# Patient Record
Sex: Female | Born: 1985
Health system: Southern US, Community
[De-identification: ages and names within clinical notes are randomized; demographics above are authoritative.]

## PROBLEM LIST (undated history)

## (undated) DIAGNOSIS — D269 Other benign neoplasm of uterus, unspecified: Secondary | ICD-10-CM

## (undated) DIAGNOSIS — N809 Endometriosis, unspecified: Secondary | ICD-10-CM

## (undated) DIAGNOSIS — Z9882 Breast implant status: Secondary | ICD-10-CM

## (undated) DIAGNOSIS — J36 Peritonsillar abscess: Secondary | ICD-10-CM

## (undated) DIAGNOSIS — R87619 Unspecified abnormal cytological findings in specimens from cervix uteri: Secondary | ICD-10-CM

## (undated) DIAGNOSIS — N946 Dysmenorrhea, unspecified: Secondary | ICD-10-CM

## (undated) DIAGNOSIS — D649 Anemia, unspecified: Secondary | ICD-10-CM

## (undated) DIAGNOSIS — D259 Leiomyoma of uterus, unspecified: Secondary | ICD-10-CM

## (undated) DIAGNOSIS — N921 Excessive and frequent menstruation with irregular cycle: Secondary | ICD-10-CM

## (undated) HISTORY — PX: LASIK: SHX215

## (undated) HISTORY — DX: Breast implant status: Z98.82

## (undated) HISTORY — DX: Unspecified abnormal cytological findings in specimens from cervix uteri: R87.619

## (undated) HISTORY — DX: Endometriosis, unspecified: N80.9

---
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
## 2009-10-19 ENCOUNTER — Inpatient Hospital Stay (HOSPITAL_COMMUNITY): Admission: AD | Admit: 2009-10-19 | Discharge: 2009-10-22 | Payer: Self-pay | Admitting: Obstetrics and Gynecology

## 2010-08-10 LAB — CBC
HCT: 31.6 % — ABNORMAL LOW (ref 36.0–46.0)
HCT: 35.6 % — ABNORMAL LOW (ref 36.0–46.0)
Hemoglobin: 12.1 g/dL (ref 12.0–15.0)
MCHC: 34 g/dL (ref 30.0–36.0)
MCHC: 34.1 g/dL (ref 30.0–36.0)
MCV: 90.1 fL (ref 78.0–100.0)
MCV: 90.2 fL (ref 78.0–100.0)
Platelets: 185 10*3/uL (ref 150–400)
RBC: 3.96 MIL/uL (ref 3.87–5.11)
RDW: 12.9 % (ref 11.5–15.5)
RDW: 13.3 % (ref 11.5–15.5)

## 2012-07-01 LAB — OB RESULTS CONSOLE ABO/RH: RH Type: POSITIVE

## 2012-07-01 LAB — OB RESULTS CONSOLE ANTIBODY SCREEN: Antibody Screen: NEGATIVE

## 2012-07-01 LAB — OB RESULTS CONSOLE HIV ANTIBODY (ROUTINE TESTING): HIV: NONREACTIVE

## 2012-07-01 LAB — OB RESULTS CONSOLE HEPATITIS B SURFACE ANTIGEN: Hepatitis B Surface Ag: NEGATIVE

## 2012-07-01 LAB — OB RESULTS CONSOLE RUBELLA ANTIBODY, IGM: RUBELLA: IMMUNE

## 2012-09-12 ENCOUNTER — Emergency Department (HOSPITAL_COMMUNITY): Payer: 59

## 2012-09-12 ENCOUNTER — Emergency Department (HOSPITAL_COMMUNITY)
Admission: EM | Admit: 2012-09-12 | Discharge: 2012-09-13 | Disposition: A | Discharge status: Home / Self Care | Payer: 59 | Attending: Emergency Medicine | Admitting: Emergency Medicine

## 2012-09-12 ENCOUNTER — Encounter (HOSPITAL_COMMUNITY): Payer: Self-pay

## 2012-09-12 DIAGNOSIS — E669 Obesity, unspecified: Secondary | ICD-10-CM | POA: Insufficient documentation

## 2012-09-12 DIAGNOSIS — K838 Other specified diseases of biliary tract: Secondary | ICD-10-CM

## 2012-09-12 DIAGNOSIS — Z3202 Encounter for pregnancy test, result negative: Secondary | ICD-10-CM | POA: Insufficient documentation

## 2012-09-12 DIAGNOSIS — Z880 Allergy status to penicillin: Secondary | ICD-10-CM | POA: Insufficient documentation

## 2012-09-12 DIAGNOSIS — Z79899 Other long term (current) drug therapy: Secondary | ICD-10-CM | POA: Insufficient documentation

## 2012-09-12 DIAGNOSIS — F17200 Nicotine dependence, unspecified, uncomplicated: Secondary | ICD-10-CM | POA: Insufficient documentation

## 2012-09-12 DIAGNOSIS — K828 Other specified diseases of gallbladder: Secondary | ICD-10-CM | POA: Insufficient documentation

## 2012-09-12 LAB — COMPREHENSIVE METABOLIC PANEL
ALT: 15 U/L (ref 0–35)
AST: 21 U/L (ref 0–37)
Albumin: 3.7 g/dL (ref 3.5–5.2)
CO2: 26 mEq/L (ref 19–32)
Calcium: 9.1 mg/dL (ref 8.4–10.5)
Chloride: 105 mEq/L (ref 96–112)
Creatinine, Ser: 0.85 mg/dL (ref 0.50–1.10)
GFR calc non Af Amer: >90 mL/min (ref >90)
Sodium: 139 mEq/L (ref 135–145)
Total Bilirubin: 0.2 mg/dL — ABNORMAL LOW (ref 0.3–1.2)

## 2012-09-12 LAB — URINALYSIS, MICROSCOPIC ONLY
Glucose, UA: NEGATIVE mg/dL
Hgb urine dipstick: NEGATIVE
Leukocytes, UA: NEGATIVE
pH: 5.5 (ref 5.0–8.0)

## 2012-09-12 LAB — CBC WITH DIFFERENTIAL/PLATELET
Basophils Absolute: 0.1 10*3/uL (ref 0.0–0.1)
Basophils Relative: 1 % (ref 0–1)
Lymphocytes Relative: 33 % (ref 12–46)
MCHC: 34.8 g/dL (ref 30.0–36.0)
Monocytes Absolute: 0.8 10*3/uL (ref 0.1–1.0)
Neutro Abs: 6.3 10*3/uL (ref 1.7–7.7)
Platelets: 302 10*3/uL (ref 150–400)
RDW: 12.5 % (ref 11.5–15.5)
WBC: 11.2 10*3/uL — ABNORMAL HIGH (ref 4.0–10.5)

## 2012-09-12 LAB — POCT PREGNANCY, URINE: Preg Test, Ur: NEGATIVE

## 2012-09-12 NOTE — ED Notes (Signed)
Received report from Kelly RN

## 2012-09-12 NOTE — ED Notes (Signed)
Pt reports RUQ pain radiating into her back x3 weeks, pt reports the pain starts around 1900 every night at dinner or just after finishing her dinner

## 2012-09-12 NOTE — ED Provider Notes (Signed)
History     CSN: [id number]  Arrival date & time 09/12/12  1926   None     Chief Complaint  Patient presents with  . Abdominal Pain    (Consider location/radiation/quality/duration/timing/severity/associated sxs/prior treatment) HPI History provided by pt.   Pt has had severe RUQ pain qhs x 3 weeks.  Usually starts around 7pm, limits her ability to sleep, but resolves by morning.  Radiates through to her mid-back.  No aggravating/alleviating factors and does not seem to be related to po intake.  No associated fever, cough, SOB, N/V/D, hematochezia/melena, urinary or vaginal sx.  No h/o abdominal surgeries.  Pt concerned she may have cholelithiasis.  History reviewed. No pertinent past medical history.  Past Surgical History  Procedure Laterality Date  . Cesarean section      History reviewed. No pertinent family history.  History  Substance Use Topics  . Smoking status: Current Every Day Smoker  . Smokeless tobacco: Not on file  . Alcohol Use: No    OB History   Grav Para Term Preterm Abortions TAB SAB Ect Mult Living                  Review of Systems  All other systems reviewed and are negative.    Allergies  Bactrim; Penicillins; and Sulfa antibiotics  Home Medications   Current Outpatient Rx  Name  Route  Sig  Dispense  Refill  . phentermine 37.5 MG capsule   Oral   Take 37.5 mg by mouth daily.         . ondansetron (ZOFRAN) 8 MG tablet   Oral   Take 1 tablet (8 mg total) by mouth every 8 (eight) hours as needed for nausea.   20 tablet   0   . oxyCODONE-acetaminophen (PERCOCET/ROXICET) 5-325 MG per tablet   Oral   Take 1 tablet by mouth every 4 (four) hours as needed for pain.   20 tablet   0     BP 126/80  Pulse 90  Temp(Src) 98 F (36.7 C) (Oral)  Resp 18  Ht 5\' 6"  (1.676 m)  Wt 170 lb (77.111 kg)  BMI 27.45 kg/m2  SpO2 100%  LMP 07/23/2012  Physical Exam  Nursing note and vitals reviewed. Constitutional: She is oriented to  person, place, and time. She appears well-developed and well-nourished. No distress.  HENT:  Head: Normocephalic and atraumatic.  Eyes:  Normal appearance  Neck: Normal range of motion.  Cardiovascular: Normal rate and regular rhythm.   Pulmonary/Chest: Effort normal and breath sounds normal. No respiratory distress.  Abdominal: Soft. Bowel sounds are normal. She exhibits no distension and no mass. There is no rebound and no guarding.  Obese.  Mild epigastric and RUQ ttp. Negative murphy's.   Genitourinary:  No CVA tenderness  Musculoskeletal: Normal range of motion.  Neurological: She is alert and oriented to person, place, and time.  Skin: Skin is warm and dry. No rash noted.  Psychiatric: She has a normal mood and affect. Her behavior is normal.    ED Course  Procedures (including critical care time)  Labs Reviewed  CBC WITH DIFFERENTIAL - Abnormal; Notable for the following:    WBC 11.2 (*)    All other components within normal limits  COMPREHENSIVE METABOLIC PANEL - Abnormal; Notable for the following:    Glucose, Bld 112 (*)    Total Bilirubin 0.2 (*)    All other components within normal limits  URINALYSIS, MICROSCOPIC ONLY - Abnormal; Notable for  the following:    Squamous Epithelial / LPF FEW (*)    All other components within normal limits  LIPASE, BLOOD  POCT PREGNANCY, URINE  POCT I-STAT TROPONIN I   US Abdomen Complete  09/13/2012  *RADIOLOGY REPORT*  Clinical Data:  Right upper quadrant pain.  Concern for cholelithiasis.  COMPLETE ABDOMINAL ULTRASOUND  Comparison:  None.  Findings:  Gallbladder:  Gallbladder sludge.  Suspect concurrent small stones. Some stones are mobile within the fundus.  No wall thickening or pericholecystic fluid. Sonographic Murphy's sign was not elicited.  Common bile duct: Normal, 3 mm.  Liver: Normal in echogenicity, without focal lesion.  IVC: Negative  Pancreas:  Negative  Spleen:  Normal in size and echogenicity.  Right Kidney:  11.8  cm. No hydronephrosis.  Left Kidney:  10.6 cm. No hydronephrosis.  Abdominal aorta:  Nonaneurysmal without ascites.  IMPRESSION: Gallbladder sludge and small stones.  No evidence of acute cholecystitis or biliary ductal dilatation.   Original Report Authenticated By: Jeronimo Greaves, M.D.      1. Biliary sludge       MDM  26yo healthy F presents w/ intermittent RUQ pain x 3 weeks.  On exam, afebrile, NAD, abd obese, soft/non-distended, mild epigastric and RUQ ttp w/ neg murphy's sign.  Labs unremarkable.  [place] abd ordered to r/o cholelithiasis and is pending.  Pt declines pain medication at this time.    US shows biliary sludge and small stones w/out cholecystitis.  Results discussed w/ pt.  No pain currently and tolerating pos.  Prescribed percocet and zofran and referred to general surgery. Return precautions discussed. 1:09 AM        Otilio Miu, PA-C 09/13/12 0109

## 2012-09-13 MED ORDER — ONDANSETRON HCL 8 MG PO TABS: 8.0000 mg | ORAL_TABLET | Freq: Three times a day (TID) | ORAL | Status: DC | PRN

## 2012-09-13 MED ORDER — OXYCODONE-ACETAMINOPHEN 5-325 MG PO TABS: 1.0000 | ORAL_TABLET | ORAL | Status: DC | PRN

## 2012-09-13 NOTE — ED Notes (Signed)
Pt comfortable with discharge instructions, prescriptions x2

## 2012-09-13 NOTE — ED Provider Notes (Signed)
  Medical screening examination/treatment/procedure(s) were performed by non-physician practitioner and as supervising physician I was immediately available for consultation/collaboration.   Gerhard Munch, MD 09/13/12 [phone]

## 2012-09-14 ENCOUNTER — Encounter (HOSPITAL_COMMUNITY): Payer: Self-pay | Admitting: Pharmacy Technician

## 2012-09-15 NOTE — Patient Instructions (Addendum)
DIOR DOMINIK  09/15/2012   Your procedure is scheduled on:   09/22/2012   Report to [facility] at  615  AM.  Call this number if you have problems the morning of surgery: [phone]   Remember:   Do not eat food or drink liquids after midnight.   Take these medicines the morning of surgery with A SIP OF WATER: zofran,percocet   Do not wear jewelry, make-up or nail polish.  Do not wear lotions, powders, or perfumes.   Do not shave 48 hours prior to surgery. Men may shave face and neck.  Do not bring valuables to the hospital.  Contacts, dentures or bridgework may not be worn into surgery.  Leave suitcase in the car. After surgery it may be brought to your room.  For patients admitted to the hospital, checkout time is 11:00 AM the day of discharge.   Patients discharged the day of surgery will not be allowed to drive  home.  Name and phone number of your driver: family  Special Instructions: Shower using CHG 2 nights before surgery and the night before surgery.  If you shower the day of surgery use CHG.  Use special wash - you have one bottle of CHG for all showers.  You should use approximately 1/3 of the bottle for each shower.   Please read over the following fact sheets that you were given: Pain Booklet, Coughing and Deep Breathing, MRSA Information, Surgical Site Infection Prevention, Anesthesia Post-op Instructions and Care and Recovery After Surgery Laparoscopic Cholecystectomy Laparoscopic cholecystectomy is surgery to remove the gallbladder. The gallbladder is located slightly to the right of center in the abdomen, behind the liver. It is a concentrating and storage sac for the bile produced in the liver. Bile aids in the digestion and absorption of fats. Gallbladder disease (cholecystitis) is an inflammation of your gallbladder. This condition is usually caused by a buildup of gallstones (cholelithiasis) in your gallbladder. Gallstones can block the flow of bile, resulting  in inflammation and pain. In severe cases, emergency surgery may be required. When emergency surgery is not required, you will have time to prepare for the procedure. Laparoscopic surgery is an alternative to open surgery. Laparoscopic surgery usually has a shorter recovery time. Your common bile duct may also need to be examined and explored. Your caregiver will discuss this with you if he or she feels this should be done. If stones are found in the common bile duct, they may be removed. LET YOUR CAREGIVER KNOW ABOUT:  Allergies to food or medicine.  Medicines taken, including vitamins, herbs, eyedrops, over-the-counter medicines, and creams.  Use of steroids (by mouth or creams).  Previous problems with anesthetics or numbing medicines.  History of bleeding problems or blood clots.  Previous surgery.  Other health problems, including diabetes and kidney problems.  Possibility of pregnancy, if this applies. RISKS AND COMPLICATIONS All surgery is associated with risks. Some problems that may occur following this procedure include:  Infection.  Damage to the common bile duct, nerves, arteries, veins, or other internal organs such as the stomach or intestines.  Bleeding.  A stone may remain in the common bile duct. BEFORE THE PROCEDURE  Do not take aspirin for 3 days prior to surgery or blood thinners for 1 week prior to surgery.  Do not eat or drink anything after midnight the night before surgery.  Let your caregiver know if you develop a cold or other infectious problem prior to  surgery.  You should be present 60 minutes before the procedure or as directed. PROCEDURE  You will be given medicine that makes you sleep (general anesthetic). When you are asleep, your surgeon will make several small cuts (incisions) in your abdomen. One of these incisions is used to insert a small, lighted scope (laparoscope) into the abdomen. The laparoscope helps the surgeon see into your abdomen.  Carbon dioxide gas will be pumped into your abdomen. The gas allows more room for the surgeon to perform your surgery. Other operating instruments are inserted through the other incisions. Laparoscopic procedures may not be appropriate when:  There is major scarring from previous surgery.  The gallbladder is extremely inflamed.  There are bleeding disorders or unexpected cirrhosis of the liver.  A pregnancy is near term.  Other conditions make the laparoscopic procedure impossible. If your surgeon feels it is not safe to continue with a laparoscopic procedure, he or she will perform an open abdominal procedure. In this case, the surgeon will make an incision to open the abdomen. This gives the surgeon a larger view and field to work within. This may allow the surgeon to perform procedures that sometimes cannot be performed with a laparoscope alone. Open surgery has a longer recovery time. AFTER THE PROCEDURE  You will be taken to the recovery area where a nurse will watch and check your progress.  You may be allowed to go home the same day.  Do not resume physical activities until directed by your caregiver.  You may resume a normal diet and activities as directed. Document Released: 05/10/2005 Document Revised: 08/02/2011 Document Reviewed: 10/23/2010 [facility] Patient Information 2013 [place], [place]. PATIENT INSTRUCTIONS POST-ANESTHESIA  IMMEDIATELY FOLLOWING SURGERY:  Do not drive or operate machinery for the first twenty four hours after surgery.  Do not make any important decisions for twenty four hours after surgery or while taking narcotic pain medications or sedatives.  If you develop intractable nausea and vomiting or a severe headache please notify your doctor immediately.  FOLLOW-UP:  Please make an appointment with your surgeon as instructed. You do not need to follow up with anesthesia unless specifically instructed to do so.  WOUND CARE INSTRUCTIONS (if applicable):  Keep  a dry clean dressing on the anesthesia/puncture wound site if there is drainage.  Once the wound has quit draining you may leave it open to air.  Generally you should leave the bandage intact for twenty four hours unless there is drainage.  If the epidural site drains for more than 36-48 hours please call the anesthesia department.  QUESTIONS?:  Please feel free to call your physician or the hospital operator if you have any questions, and they will be happy to assist you.

## 2012-09-18 ENCOUNTER — Encounter (HOSPITAL_COMMUNITY)
Admission: RE | Admit: 2012-09-18 | Discharge: 2012-09-18 | Disposition: A | Discharge status: Home / Self Care | Payer: 59 | Source: Ambulatory Visit | Attending: Orthopedic Surgery | Admitting: Orthopedic Surgery

## 2012-09-18 ENCOUNTER — Encounter (HOSPITAL_COMMUNITY): Payer: Self-pay

## 2012-09-18 NOTE — Consult Note (Signed)
**Note Tanya Sanders** Tanya Sanders, SOULE           ACCOUNT NO.:  [id number]  MEDICAL RECORD NO.:  [id number]  LOCATION:                                 FACILITY:  PHYSICIAN:  Barbaraann Barthel, M.D. 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  In essence, she has had about a 3-week history of recurrent right upper quadrant pain with nausea.  No vomiting.  She was seen in the emergency room, where she was noted to have multiple small stones within the gallbladder and no obvious pericystic fluid or thickening.  She referred herself to me, and I saw her in my office on September 13, 2012, at which time I performed a history and physical in preparation for cholecystectomy.  She is in no acute distress.  She has very minimal right upper quadrant discomfort at present.  She has no masses, no hernias, and the rectal examination was deferred as the patient is presently having her menses.  PAST SURGICAL PROCEDURES:  She has had a C-section in 2011.  ALLERGIES:  She is allergic to PENICILLIN, SULFA, and BACTRIM for her regular medications consult medicine list.  SOCIAL HISTORY:  She smokes about a half pack of cigarettes per day and she is a non drinker.  PHYSICAL EXAMINATION:  VITAL SIGNS:  As stated, she is in no acute distress.  She is 5 feet 6 inches, weighs 170 pounds.  Temperature is 98.0, pulse rate is 100, respirations 14, blood pressure 90/64. HEENT:  Head is normocephalic.  Eyes, extraocular movements are intact. Pupils were round and react to light and accommodation.  The sclerae are anicteric.  She has no adenopathy and no thyromegaly.  No bruits are appreciated. CHEST:  Clear both to anterior and posterior auscultation. HEART:  Regular rhythm. BREASTS AND AXILLA:  Without  masses. ABDOMEN:  Softly tender in the right upper quadrant.  No hernias are appreciated. RECTAL AND PELVIC:  Deferred due to the patient's having her menses. EXTREMITIES:  Within normal limits without clubbing, varicosities, or cyanosis.  REVIEW OF SYSTEMS:  NEURO SYSTEM:  No history of migraines or seizures or history of any lateralizing symptoms noted. ENDOCRINE SYSTEM:  No history of diabetes or thyroid disease. CARDIOPULMONARY SYSTEM:  She is a smoker.  MUSCULOSKELETAL SYSTEM: Grossly within normal limits.  OB/GYN HISTORY:  She is a gravida 1, para 0, cesarean 1, abortus 0 female who is currently menstruating.  GI SYSTEM:  No history of hepatitis, constipation, diarrhea, bright red rectal bleeding, or history of inflammatory bowel disease or irritable bowel syndrome.  No history of unexplained weight loss.  She has never had a colonoscopy.  GU SYSTEM:  No history of frequency, dysuria, or kidney stones,  LABORATORY DATA:  Pending.  Sonogram results as mentioned above.  In essence, Tanya Sanders is a 27 year old white female who has symptomatic gall stones.  We will plan to take care of this as soon as she is able to arrange this around her work schedule.  In  the interim, she has been placed on a restrictive diet and she is told to come to me or go to the emergency room should there be any acute flare up.  We discussed the need for surgery, discussing complications of laparoscopic surgery, not limited to, but including bleeding, infection, damage to bile ducts, perforation of organs, transitory diarrhea, and the possibility of open surgery might be required.  Informed consent was obtained.     Barbaraann Barthel, M.D.     WB/MEDQ  D:  09/15/2012  T:  09/16/2012  Job:  [id number]

## 2012-09-22 ENCOUNTER — Ambulatory Visit (HOSPITAL_COMMUNITY): Payer: 59 | Admitting: Anesthesiology

## 2012-09-22 ENCOUNTER — Encounter (HOSPITAL_COMMUNITY): Payer: Self-pay | Admitting: Anesthesiology

## 2012-09-22 ENCOUNTER — Encounter (HOSPITAL_COMMUNITY): Payer: Self-pay | Admitting: *Deleted

## 2012-09-22 ENCOUNTER — Encounter (HOSPITAL_COMMUNITY)
Admission: RE | Disposition: A | Discharge status: Home / Self Care | Payer: Self-pay | Source: Ambulatory Visit | Attending: General Surgery

## 2012-09-22 ENCOUNTER — Observation Stay (HOSPITAL_COMMUNITY)
Admission: RE | Admit: 2012-09-22 | Discharge: 2012-09-23 | Disposition: A | Discharge status: Home / Self Care | Payer: 59 | Source: Ambulatory Visit | Attending: General Surgery | Admitting: General Surgery

## 2012-09-22 DIAGNOSIS — K8010 Calculus of gallbladder with chronic cholecystitis without obstruction: Principal | ICD-10-CM | POA: Insufficient documentation

## 2012-09-22 DIAGNOSIS — F17200 Nicotine dependence, unspecified, uncomplicated: Secondary | ICD-10-CM | POA: Insufficient documentation

## 2012-09-22 DIAGNOSIS — Z01812 Encounter for preprocedural laboratory examination: Secondary | ICD-10-CM | POA: Insufficient documentation

## 2012-09-22 HISTORY — PX: CHOLECYSTECTOMY: SHX55

## 2012-09-22 SURGERY — LAPAROSCOPIC CHOLECYSTECTOMY
Anesthesia: General | Site: Abdomen | Wound class: Clean Contaminated

## 2012-09-22 MED ORDER — ROCURONIUM BROMIDE 100 MG/10ML IV SOLN
INTRAVENOUS | Status: DC | PRN
  Administered 2012-09-22 (×2): 5 mg via INTRAVENOUS
  Administered 2012-09-22 (×2): 10 mg via INTRAVENOUS
  Administered 2012-09-22: 40 mg via INTRAVENOUS

## 2012-09-22 MED ORDER — 0.9 % SODIUM CHLORIDE (POUR BTL) OPTIME
TOPICAL | Status: DC | PRN
  Administered 2012-09-22: 1000 mL

## 2012-09-22 MED ORDER — MIDAZOLAM HCL 2 MG/2ML IJ SOLN
INTRAMUSCULAR | Status: AC
  Filled 2012-09-22: qty 2

## 2012-09-22 MED ORDER — ONDANSETRON HCL 4 MG PO TABS: 4.0000 mg | ORAL_TABLET | Freq: Four times a day (QID) | ORAL | Status: DC | PRN

## 2012-09-22 MED ORDER — LORAZEPAM 0.5 MG PO TABS
0.5000 mg | ORAL_TABLET | Freq: Every day | ORAL | Status: DC
  Administered 2012-09-22: 0.5 mg via ORAL
  Filled 2012-09-22: qty 1

## 2012-09-22 MED ORDER — LIDOCAINE HCL (PF) 1 % IJ SOLN
INTRAMUSCULAR | Status: AC
  Filled 2012-09-22: qty 5

## 2012-09-22 MED ORDER — FENTANYL CITRATE 0.05 MG/ML IJ SOLN
INTRAMUSCULAR | Status: DC | PRN
  Administered 2012-09-22 (×5): 50 ug via INTRAVENOUS
  Administered 2012-09-22 (×2): 25 ug via INTRAVENOUS
  Administered 2012-09-22: 50 ug via INTRAVENOUS
  Administered 2012-09-22: 100 ug via INTRAVENOUS

## 2012-09-22 MED ORDER — ONDANSETRON HCL 4 MG/2ML IJ SOLN
INTRAMUSCULAR | Status: AC
  Filled 2012-09-22: qty 2

## 2012-09-22 MED ORDER — DEXAMETHASONE SODIUM PHOSPHATE 4 MG/ML IJ SOLN
INTRAMUSCULAR | Status: AC
  Filled 2012-09-22: qty 1

## 2012-09-22 MED ORDER — FENTANYL CITRATE 0.05 MG/ML IJ SOLN
25.0000 ug | INTRAMUSCULAR | Status: DC | PRN
  Administered 2012-09-22 (×2): 50 ug via INTRAVENOUS
  Administered 2012-09-22: 25 ug via INTRAVENOUS
  Administered 2012-09-22: 50 ug via INTRAVENOUS

## 2012-09-22 MED ORDER — CIPROFLOXACIN IN D5W 400 MG/200ML IV SOLN
400.0000 mg | INTRAVENOUS | Status: AC
  Administered 2012-09-22: 400 mg via INTRAVENOUS

## 2012-09-22 MED ORDER — FENTANYL CITRATE 0.05 MG/ML IJ SOLN
INTRAMUSCULAR | Status: AC
  Filled 2012-09-22: qty 5

## 2012-09-22 MED ORDER — POTASSIUM CHLORIDE IN NACL 20-0.9 MEQ/L-% IV SOLN
INTRAVENOUS | Status: DC
  Administered 2012-09-22 – 2012-09-23 (×2): via INTRAVENOUS

## 2012-09-22 MED ORDER — FENTANYL CITRATE 0.05 MG/ML IJ SOLN
INTRAMUSCULAR | Status: AC
  Filled 2012-09-22: qty 2

## 2012-09-22 MED ORDER — ONDANSETRON HCL 4 MG/2ML IJ SOLN: 4.0000 mg | Freq: Once | INTRAMUSCULAR | Status: DC | PRN

## 2012-09-22 MED ORDER — PROPOFOL 10 MG/ML IV EMUL
INTRAVENOUS | Status: AC
  Filled 2012-09-22: qty 20

## 2012-09-22 MED ORDER — BUPIVACAINE HCL (PF) 0.5 % IJ SOLN
INTRAMUSCULAR | Status: DC | PRN
  Administered 2012-09-22: 10 mL

## 2012-09-22 MED ORDER — MORPHINE SULFATE 2 MG/ML IJ SOLN
1.0000 mg | INTRAMUSCULAR | Status: DC | PRN
  Administered 2012-09-22 – 2012-09-23 (×8): 1 mg via INTRAVENOUS
  Filled 2012-09-22 (×8): qty 1

## 2012-09-22 MED ORDER — PROPOFOL 10 MG/ML IV BOLUS
INTRAVENOUS | Status: DC | PRN
  Administered 2012-09-22: 150 mg via INTRAVENOUS
  Administered 2012-09-22: 20 mg via INTRAVENOUS
  Administered 2012-09-22: 30 mg via INTRAVENOUS

## 2012-09-22 MED ORDER — SODIUM CHLORIDE 0.9 % IR SOLN
Status: DC | PRN
  Administered 2012-09-22: 3000 mL

## 2012-09-22 MED ORDER — WATER FOR IRRIGATION, STERILE IR SOLN
Status: DC | PRN
  Administered 2012-09-22: 2000 mL

## 2012-09-22 MED ORDER — ROCURONIUM BROMIDE 50 MG/5ML IV SOLN
INTRAVENOUS | Status: AC
  Filled 2012-09-22: qty 1

## 2012-09-22 MED ORDER — GLYCOPYRROLATE 0.2 MG/ML IJ SOLN
INTRAMUSCULAR | Status: AC
  Filled 2012-09-22: qty 1

## 2012-09-22 MED ORDER — LIDOCAINE HCL (PF) 1 % IJ SOLN
INTRAMUSCULAR | Status: AC
  Filled 2012-09-22: qty 2

## 2012-09-22 MED ORDER — NEOSTIGMINE METHYLSULFATE 1 MG/ML IJ SOLN
INTRAMUSCULAR | Status: DC | PRN
  Administered 2012-09-22: 2 mg via INTRAVENOUS

## 2012-09-22 MED ORDER — HEMOSTATIC AGENTS (NO CHARGE) OPTIME
TOPICAL | Status: DC | PRN
  Administered 2012-09-22: 1 via TOPICAL

## 2012-09-22 MED ORDER — NEOSTIGMINE METHYLSULFATE 1 MG/ML IJ SOLN
INTRAMUSCULAR | Status: AC
  Filled 2012-09-22: qty 1

## 2012-09-22 MED ORDER — MIDAZOLAM HCL 2 MG/2ML IJ SOLN
1.0000 mg | INTRAMUSCULAR | Status: DC | PRN
  Administered 2012-09-22: 2 mg via INTRAVENOUS

## 2012-09-22 MED ORDER — ONDANSETRON HCL 4 MG/2ML IJ SOLN
4.0000 mg | Freq: Once | INTRAMUSCULAR | Status: AC
  Administered 2012-09-22: 4 mg via INTRAVENOUS

## 2012-09-22 MED ORDER — LACTATED RINGERS IV SOLN
INTRAVENOUS | Status: DC
  Administered 2012-09-22 (×2): via INTRAVENOUS

## 2012-09-22 MED ORDER — ONDANSETRON HCL 4 MG/2ML IJ SOLN
4.0000 mg | Freq: Four times a day (QID) | INTRAMUSCULAR | Status: DC | PRN
  Administered 2012-09-22 – 2012-09-23 (×2): 4 mg via INTRAVENOUS
  Filled 2012-09-22 (×2): qty 2

## 2012-09-22 MED ORDER — GLYCOPYRROLATE 0.2 MG/ML IJ SOLN
INTRAMUSCULAR | Status: DC | PRN
  Administered 2012-09-22: 0.4 mg via INTRAVENOUS

## 2012-09-22 MED ORDER — LIDOCAINE HCL (CARDIAC) 10 MG/ML IV SOLN
INTRAVENOUS | Status: DC | PRN
  Administered 2012-09-22: 20 mg via INTRAVENOUS

## 2012-09-22 MED ORDER — DEXAMETHASONE SODIUM PHOSPHATE 4 MG/ML IJ SOLN
4.0000 mg | Freq: Once | INTRAMUSCULAR | Status: AC
  Administered 2012-09-22: 4 mg via INTRAVENOUS

## 2012-09-22 MED ORDER — CIPROFLOXACIN IN D5W 400 MG/200ML IV SOLN
INTRAVENOUS | Status: AC
  Filled 2012-09-22: qty 200

## 2012-09-22 MED ORDER — BUPIVACAINE HCL (PF) 0.5 % IJ SOLN
INTRAMUSCULAR | Status: AC
  Filled 2012-09-22: qty 30

## 2012-09-22 SURGICAL SUPPLY — 67 items
APPLICATOR COTTON TIP 6IN STRL (MISCELLANEOUS) ×2 IMPLANT
APPLIER CLIP LAPSCP 10X32 DD (CLIP) ×2 IMPLANT
ATTRACTOMAT 16X20 MAGNETIC DRP (DRAPES) IMPLANT
BAG HAMPER (MISCELLANEOUS) ×2 IMPLANT
BAG SPEC RTRVL LRG 6X4 10 (ENDOMECHANICALS) ×1
BLADE SURG 15 STRL LF DISP TIS (BLADE) ×1 IMPLANT
BLADE SURG 15 STRL SS (BLADE) ×2
BLADE SURG SZ10 CARB STEEL (BLADE) ×1 IMPLANT
CLOTH BEACON ORANGE TIMEOUT ST (SAFETY) ×2 IMPLANT
COVER LIGHT HANDLE STERIS (MISCELLANEOUS) ×4 IMPLANT
CUTTER LINEAR ENDO 35 ETS TH (STAPLE) ×1 IMPLANT
DECANTER SPIKE VIAL GLASS SM (MISCELLANEOUS) ×2 IMPLANT
DISSECTOR BLUNT TIP ENDO 5MM (MISCELLANEOUS) ×2 IMPLANT
DRAPE WARM FLUID 44X44 (DRAPE) IMPLANT
DRSG TEGADERM 2-3/8X2-3/4 SM (GAUZE/BANDAGES/DRESSINGS) ×5 IMPLANT
ELECT BLADE 6 FLAT ULTRCLN (ELECTRODE) IMPLANT
ELECT REM PT RETURN 9FT ADLT (ELECTROSURGICAL) ×2
ELECTRODE REM PT RTRN 9FT ADLT (ELECTROSURGICAL) ×1 IMPLANT
EVACUATOR DRAINAGE 10X20 100CC (DRAIN) ×1 IMPLANT
EVACUATOR SILICONE 100CC (DRAIN) ×2
FILTER SMOKE EVAC LAPAROSHD (FILTER) ×1 IMPLANT
FORMALIN 10 PREFIL 120ML (MISCELLANEOUS) ×2 IMPLANT
GLOVE BIOGEL PI IND STRL 7.0 (GLOVE) IMPLANT
GLOVE BIOGEL PI IND STRL 8 (GLOVE) IMPLANT
GLOVE BIOGEL PI INDICATOR 7.0 (GLOVE) ×1
GLOVE BIOGEL PI INDICATOR 8 (GLOVE) ×1
GLOVE EXAM NITRILE MD LF STRL (GLOVE) ×1 IMPLANT
GLOVE OPTIFIT SS 8.0 STRL (GLOVE) ×1 IMPLANT
GLOVE SKINSENSE NS SZ7.0 (GLOVE) ×1
GLOVE SKINSENSE STRL SZ7.0 (GLOVE) ×1 IMPLANT
GOWN STRL REIN XL XLG (GOWN DISPOSABLE) ×6 IMPLANT
HEMOSTAT SURGICEL 4X8 (HEMOSTASIS) ×2 IMPLANT
INST SET LAPROSCOPIC AP (KITS) ×2 IMPLANT
IV NS IRRIG 3000ML ARTHROMATIC (IV SOLUTION) ×2 IMPLANT
KIT ROOM TURNOVER APOR (KITS) ×2 IMPLANT
KIT TROCAR LAP CHOLE (TROCAR) ×2 IMPLANT
MANIFOLD NEPTUNE II (INSTRUMENTS) ×2 IMPLANT
NS IRRIG 1000ML POUR BTL (IV SOLUTION) ×2 IMPLANT
PACK LAP CHOLE LZT030E (CUSTOM PROCEDURE TRAY) ×2 IMPLANT
PAD ARMBOARD 7.5X6 YLW CONV (MISCELLANEOUS) ×2 IMPLANT
PENCIL HANDSWITCHING (ELECTRODE) IMPLANT
POUCH SPECIMEN RETRIEVAL 10MM (ENDOMECHANICALS) ×2 IMPLANT
SET BASIN LINEN APH (SET/KITS/TRAYS/PACK) ×2 IMPLANT
SET TUBE IRRIG SUCTION NO TIP (IRRIGATION / IRRIGATOR) ×2 IMPLANT
SOL PREP PROV IODINE SCRUB 4OZ (MISCELLANEOUS) ×1 IMPLANT
SPONGE DRAIN TRACH 4X4 STRL 2S (GAUZE/BANDAGES/DRESSINGS) ×2 IMPLANT
SPONGE GAUZE 4X4 12PLY (GAUZE/BANDAGES/DRESSINGS) ×2 IMPLANT
SPONGE INTESTINAL PEANUT (DISPOSABLE) IMPLANT
SPONGE LAP 18X18 X RAY DECT (DISPOSABLE) IMPLANT
STAPLER VISISTAT 35W (STAPLE) ×2 IMPLANT
SUT ETHILON 3 0 FSL (SUTURE) ×2 IMPLANT
SUT SILK 2 0 (SUTURE)
SUT SILK 2 0 SH (SUTURE) IMPLANT
SUT SILK 2-0 18XBRD TIE 12 (SUTURE) IMPLANT
SUT SILK 3 0 SH CR/8 (SUTURE) IMPLANT
SUT VIC AB 0 CT1 27 (SUTURE)
SUT VIC AB 0 CT1 27XBRD ANTBC (SUTURE) IMPLANT
SUT VIC AB 0 CT1 27XCR 8 STRN (SUTURE) IMPLANT
SUT VICRYL 0 UR6 27IN ABS (SUTURE) ×2 IMPLANT
SYR BULB IRRIGATION 50ML (SYRINGE) IMPLANT
TAPE CLOTH SURG 4X10 WHT LF (GAUZE/BANDAGES/DRESSINGS) ×1 IMPLANT
TOWEL OR 17X26 4PK STRL BLUE (TOWEL DISPOSABLE) ×2 IMPLANT
TRAY FOLEY CATH 14FR (SET/KITS/TRAYS/PACK) ×2 IMPLANT
TUBING INSUF HEATED (TUBING) ×1 IMPLANT
WARMER LAPAROSCOPE (MISCELLANEOUS) ×2 IMPLANT
WATER STERILE IRR 1000ML POUR (IV SOLUTION) ×4 IMPLANT
YANKAUER SUCT BULB TIP 10FT TU (MISCELLANEOUS) IMPLANT

## 2012-09-22 NOTE — Progress Notes (Signed)
26 yr. Old W. Female for lap. Cholecystectomy for gall stones.  Procedure and risks explained and informed consent obtained.  Labs reviewed and all questions answered.  No clinical changes in H&P; dict. # [id number].   Filed Vitals:   09/22/12 0644  BP: 109/77  Pulse: 80  Temp: 97.7 F (36.5 C)  Resp: 16

## 2012-09-22 NOTE — Anesthesia Procedure Notes (Signed)
Procedure Name: Intubation Date/Time: 09/22/2012 7:43 AM Performed by: Franco Nones Pre-anesthesia Checklist: Patient identified, Patient being monitored, Timeout performed, Emergency Drugs available and Suction available Patient Re-evaluated:Patient Re-evaluated prior to inductionOxygen Delivery Method: Circle System Utilized Preoxygenation: Pre-oxygenation with 100% oxygen Intubation Type: IV induction Ventilation: Mask ventilation without difficulty Laryngoscope Size: Miller and 2 Grade View: Grade I Tube type: Oral Tube size: 7.0 mm Number of attempts: 1 Airway Equipment and Method: stylet Placement Confirmation: ETT inserted through vocal cords under direct vision,  positive ETCO2 and breath sounds checked- equal and bilateral Secured at: 21 cm Tube secured with: Tape Dental Injury: Teeth and Oropharynx as per pre-operative assessment

## 2012-09-22 NOTE — Anesthesia Preprocedure Evaluation (Addendum)
Anesthesia Evaluation  Patient identified by MRN, date of birth, ID band Patient awake    Reviewed: Allergy & Precautions, H&P , NPO status , Patient's Chart, lab work & pertinent test results  Airway Mallampati: I TM Distance: >3 FB     Dental  (+) Teeth Intact   Pulmonary neg pulmonary ROS, Current Smoker,  breath sounds clear to auscultation        Cardiovascular negative cardio ROS  Rhythm:Regular Rate:Normal     Neuro/Psych    GI/Hepatic negative GI ROS,   Endo/Other    Renal/GU      Musculoskeletal   Abdominal   Peds  Hematology   Anesthesia Other Findings   Reproductive/Obstetrics                          Anesthesia Physical Anesthesia Plan  ASA: II  Anesthesia Plan: General   Post-op Pain Management:    Induction: Intravenous  Airway Management Planned: Oral ETT  Additional Equipment:   Intra-op Plan:   Post-operative Plan: Extubation in OR  Informed Consent: I have reviewed the patients History and Physical, chart, labs and discussed the procedure including the risks, benefits and alternatives for the proposed anesthesia with the patient or authorized representative who has indicated his/her understanding and acceptance.     Plan Discussed with:   Anesthesia Plan Comments:         Anesthesia Quick Evaluation

## 2012-09-22 NOTE — Transfer of Care (Signed)
Immediate Anesthesia Transfer of Care Note  Patient: Tanya Sanders  Procedure(s) Performed: Procedure(s) (LRB): LAPAROSCOPIC CHOLECYSTECTOMY (N/A)  Patient Location: PACU  Anesthesia Type: General  Level of Consciousness: awake  Airway & Oxygen Therapy: Patient Spontanous Breathing and non-rebreather face mask  Post-op Assessment: Report given to PACU RN, Post -op Vital signs reviewed and stable and Patient moving all extremities  Post vital signs: Reviewed and stable  Complications: No apparent anesthesia complications

## 2012-09-22 NOTE — Progress Notes (Signed)
Post OP Check  Awake and alert. Some expected incisional pain, but controlled with pain meds.  Dressings dry and in tact.  Minimal serous JP drainage. Pt has voided without dysuria.  Doing well post Op.  Filed Vitals:   09/22/12 1300  BP: 106/65  Pulse: 67  Temp: 98.2 F (36.8 C)  Resp: 16   discharge in AM.

## 2012-09-22 NOTE — Brief Op Note (Signed)
09/22/2012  9:58 AM  PATIENT:  Tanya Sanders  27 y.o. female  PRE-OPERATIVE DIAGNOSIS:  cholecystitis, cholelithiasis  POST-OPERATIVE DIAGNOSIS:  cholecystitis, cholelithiasis  PROCEDURE:  Procedure(s): LAPAROSCOPIC CHOLECYSTECTOMY (N/A)  SURGEON:  Surgeon(s) and Role:    * Marlane Hatcher, MD - Primary  PHYSICIAN ASSISTANT:   ASSISTANTS: none   ANESTHESIA:   general  EBL:  Total I/O In: 1800 [I.V.:1800] Out: 10 [Blood:10]  BLOOD ADMINISTERED:none  DRAINS: Penrose drain in the the liver bed   LOCAL MEDICATIONS USED:  MARCAINE  0.5% ~10 cc.   SPECIMEN:  Source of Specimen:  gall bladder and stones.  DISPOSITION OF SPECIMEN:  PATHOLOGY  COUNTS:  YES  TOURNIQUET:  * No tourniquets in log *  DICTATION: .Other Dictation: Dictation Number OR dict. # [id number].  PLAN OF CARE: Admit for overnight observation  PATIENT DISPOSITION:  PACU - hemodynamically stable.   Delay start of Pharmacological VTE agent (>24hrs) due to surgical blood loss or risk of bleeding: not applicable

## 2012-09-22 NOTE — Anesthesia Postprocedure Evaluation (Signed)
Anesthesia Post Note  Patient: Tanya Sanders  Procedure(s) Performed: Procedure(s) (LRB): LAPAROSCOPIC CHOLECYSTECTOMY (N/A)  Anesthesia type: General  Patient location: PACU  Post pain: Pain level controlled  Post assessment: Post-op Vital signs reviewed, Patient's Cardiovascular Status Stable, Respiratory Function Stable, Patent Airway, No signs of Nausea or vomiting and Pain level controlled   Post vital signs: Reviewed and stable  Level of consciousness: awake and alert   Complications: No apparent anesthesia complications

## 2012-09-23 LAB — CBC
HCT: 35.8 % — ABNORMAL LOW (ref 36.0–46.0)
MCH: 29.2 pg (ref 26.0–34.0)
MCHC: 34.1 g/dL (ref 30.0–36.0)
MCV: 85.6 fL (ref 78.0–100.0)
Platelets: 287 10*3/uL (ref 150–400)
RDW: 12.3 % (ref 11.5–15.5)
WBC: 14.5 10*3/uL — ABNORMAL HIGH (ref 4.0–10.5)

## 2012-09-23 LAB — BASIC METABOLIC PANEL
BUN: 12 mg/dL (ref 6–23)
Calcium: 8.6 mg/dL (ref 8.4–10.5)
Creatinine, Ser: 0.88 mg/dL (ref 0.50–1.10)
GFR calc Af Amer: >90 mL/min (ref >90)
GFR calc non Af Amer: 90 mL/min — ABNORMAL LOW (ref >90)

## 2012-09-23 LAB — HEPATIC FUNCTION PANEL
Bilirubin, Direct: <0.1 mg/dL (ref 0.0–0.3)
Total Protein: 6.2 g/dL (ref 6.0–8.3)

## 2012-09-23 MED ORDER — OXYCODONE-ACETAMINOPHEN 10-325 MG PO TABS: 1.0000 | ORAL_TABLET | ORAL | Status: DC | PRN

## 2012-09-23 NOTE — Plan of Care (Signed)
Problem: Discharge Progression Outcomes Goal: Activity appropriate for discharge plan Outcome: Completed/Met Date Met:  09/23/12 ambulatory Goal: Tubes and drains discontinued if indicated Outcome: Completed/Met Date Met:  09/23/12 JP drain removed by Dr Malvin Johns Goal: Staples/sutures removed Outcome: Not Applicable Date Met:  09/23/12 Will remain in place until out pt visit Goal: Other Discharge Outcomes/Goals Outcome: Completed/Met Date Met:  09/23/12 Discharged to home with spouse

## 2012-09-23 NOTE — Progress Notes (Signed)
POD # 1  Pt. C/O  "gas pains" other than that she feels OK.  Wound clean and very scant  drainage from JP drain; this was removed.  Wound was clean and redressed. Labs OK bili within normal limits.   Discharge and follow up arranged.  Filed Vitals:   09/23/12 0613  BP: 118/81  Pulse: 74  Temp: 97.7 F (36.5 C)  Resp: 20   Discharge dictated, dict. # [id number].

## 2012-09-24 NOTE — Discharge Summary (Signed)
NAMEDIANDRA, CIMINI           ACCOUNT NO.:  [id number]  MEDICAL RECORD NO.:  [id number]  LOCATION:  A321                          FACILITY:  [facility]  PHYSICIAN:  Barbaraann Barthel, M.D. DATE OF BIRTH:  1985-07-15  DATE OF ADMISSION:  09/22/2012 DATE OF DISCHARGE:  05/03/2014LH                              DISCHARGE SUMMARY   DIAGNOSES:  Cholecystitis, cholelithiasis.  PROCEDURE:  Laparoscopic cholecystectomy on Sep 22, 2012.  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  She was noted on sonogram to have multiple small stones.  We discussed outpatient surgery with her after she had been seen in the emergency room and came to my office.  She was admitted via the outpatient department.  Laparoscopic cholecystectomy was performed.  This was uneventful.  The patient was noted to have a large cystic duct, which required the GIA stapler to close the duct; however; there were no other problems.  Postoperatively, she did have some incisional pain and some gas pains; however, this was improving.  Her wound remained clean.  There was minimal Jackson-Pratt drainage and it was serous in nature.  Her laboratory data including liver function studies and bilirubin were grossly within normal limits on the first postoperative day, and she was generally improving.  Her drain was removed and she was discharged and told to contact me should she have any acute problems and discharge instructions were given.  I did discharge her on some Percocet 10/325 for pain, and I also gave her my cell number in case she had any questions.     Barbaraann Barthel, M.D.     WB/MEDQ  D:  09/23/2012  T:  09/24/2012  Job:  [id number]

## 2012-09-25 ENCOUNTER — Other Ambulatory Visit (HOSPITAL_COMMUNITY): Payer: 59

## 2012-09-25 ENCOUNTER — Encounter (HOSPITAL_COMMUNITY): Payer: Self-pay | Admitting: General Surgery

## 2012-09-25 NOTE — Op Note (Signed)
Tanya Sanders, Tanya Sanders           ACCOUNT NO.:  [id number]  MEDICAL RECORD NO.:  [id number]  LOCATION:                                 FACILITY:  PHYSICIAN:  Barbaraann Barthel, M.D. DATE OF BIRTH:  01-24-1986  DATE OF PROCEDURE:  09/22/2012 DATE OF DISCHARGE:  09/23/2012                              OPERATIVE REPORT   DIAGNOSES:  Cholecystitis, cholelithiasis.  PROCEDURE:  Laparoscopic cholecystectomy.  WOUND CLASSIFICATION:  Clean and contaminated.  SPECIMEN:  Gallbladder and stones.  NOTE:  This is a 27 year old white female who had recurrent episodes of postprandial right upper quadrant pain radiating to her back accompanied with nausea.  She was seen in the emergency room, where she had non thickened gallbladder, but stones present and she requested need for her surgery.  She came to my office, where we discussed surgery in detail. I had reviewed this sonogram and liver function results and we discussed complications not limited to, but including bleeding, infection, damage to bile ducts, perforation of organs, transitory diarrhea, and the possibility of open surgery might be required.  Informed consent was obtained.  GROSS OPERATIVE FINDINGS:  The patient had a long attenuated gallbladder and cystic duct.  The cystic duct was thick, was enlarged.  We dissected closely to see that enter the gallbladder, so that we were quite sure when we would divide that the structure was the cystic duct.  TECHNIQUE:  The patient was placed in supine position and after the adequate administration of general anesthesia via endotracheal intubation, a Foley catheter was aseptically inserted and her abdomen was prepped with antiseptic solution and draped in the usual manner.  A periumbilical incision was carried out over the superior aspect of the umbilicus down to the fascia which was grasped with a sharp towel clip and a Veress needle was placed and the abdomen was insufflated  with approximately 3.5 L of CO2.  We then using the Visiport technique, an 11 mm cannula was placed in the umbilicus and then under direct vision another 11-mm cannula was placed in the epigastrium with two 5 mm cannulas in the right upper quadrant laterally.  The gallbladder was grasped.  Its adhesions were taken down, and the cystic duct was carefully dissected, so that we saw it enter without a doubt into the gallbladder.  This was enlarged, so I made sure that the dissection was quite complete dissecting from the fundus down towards the cystic duct in a retrograde manner.  I called in Dr. Lovell Sheehan also to verify and he agreed with the anatomy that I delineated.  We clipped the cystic artery which was clearly visualized going to the gallbladder, doubly clipped this and then used the Endo-GIA vascular stapler 35 to divide the cystic duct.  We then irrigated with normal saline solution.  I checked for hemostasis touching up little areas of bleeding on the liver, and then I placed Surgicel in the liver bed within it and the Jackson-Pratt drain exited from the lateral most incision site.  After irrigating, we then closed the fascial layer in the area of the epigastrium and the umbilicus with 0 Polysorb sutures and then closed all wounds with a stapling device with a sterile dressing  applied.  The drain was sutured in place with 3-0 nylon.  Prior to closure, all sponge, needle, and instrument counts were found to be correct.  Estimated blood loss was minimal.  The patient was replaced with crystalloids.  There were no complications.     Barbaraann Barthel, M.D.     WB/MEDQ  D:  09/22/2012  T:  09/23/2012  Job:  [id number]

## 2012-10-06 ENCOUNTER — Encounter (HOSPITAL_COMMUNITY): Payer: Self-pay | Admitting: Anesthesiology

## 2012-10-06 ENCOUNTER — Inpatient Hospital Stay (HOSPITAL_COMMUNITY): Payer: 59

## 2012-10-06 ENCOUNTER — Observation Stay (HOSPITAL_COMMUNITY): Payer: 59

## 2012-10-06 ENCOUNTER — Emergency Department (HOSPITAL_COMMUNITY): Payer: 59

## 2012-10-06 ENCOUNTER — Encounter (HOSPITAL_COMMUNITY)
Admission: EM | Disposition: A | Discharge status: Home / Self Care | Payer: Self-pay | Source: Home / Self Care | Attending: Internal Medicine

## 2012-10-06 ENCOUNTER — Observation Stay (HOSPITAL_COMMUNITY)
Admission: EM | Admit: 2012-10-06 | Discharge: 2012-10-07 | Disposition: A | Discharge status: Home / Self Care | Payer: 59 | Attending: Internal Medicine | Admitting: Internal Medicine

## 2012-10-06 ENCOUNTER — Observation Stay (HOSPITAL_COMMUNITY): Payer: 59 | Admitting: Anesthesiology

## 2012-10-06 ENCOUNTER — Encounter (HOSPITAL_COMMUNITY): Payer: Self-pay | Admitting: Emergency Medicine

## 2012-10-06 DIAGNOSIS — R1011 Right upper quadrant pain: Secondary | ICD-10-CM

## 2012-10-06 DIAGNOSIS — R110 Nausea: Secondary | ICD-10-CM | POA: Insufficient documentation

## 2012-10-06 DIAGNOSIS — R7989 Other specified abnormal findings of blood chemistry: Secondary | ICD-10-CM | POA: Insufficient documentation

## 2012-10-06 DIAGNOSIS — M546 Pain in thoracic spine: Secondary | ICD-10-CM | POA: Insufficient documentation

## 2012-10-06 DIAGNOSIS — K8021 Calculus of gallbladder without cholecystitis with obstruction: Secondary | ICD-10-CM

## 2012-10-06 DIAGNOSIS — R109 Unspecified abdominal pain: Secondary | ICD-10-CM

## 2012-10-06 DIAGNOSIS — K838 Other specified diseases of biliary tract: Secondary | ICD-10-CM | POA: Insufficient documentation

## 2012-10-06 DIAGNOSIS — R1013 Epigastric pain: Secondary | ICD-10-CM | POA: Insufficient documentation

## 2012-10-06 DIAGNOSIS — K8050 Calculus of bile duct without cholangitis or cholecystitis without obstruction: Principal | ICD-10-CM | POA: Insufficient documentation

## 2012-10-06 DIAGNOSIS — K831 Obstruction of bile duct: Secondary | ICD-10-CM | POA: Insufficient documentation

## 2012-10-06 HISTORY — PX: ERCP: SHX5425

## 2012-10-06 HISTORY — PX: SPHINCTEROTOMY: SHX5544

## 2012-10-06 HISTORY — PX: REMOVAL OF STONES: SHX5545

## 2012-10-06 LAB — CBC WITH DIFFERENTIAL/PLATELET
Eosinophils Relative: 3 % (ref 0–5)
HCT: 39.5 % (ref 36.0–46.0)
Hemoglobin: 13.5 g/dL (ref 12.0–15.0)
Lymphocytes Relative: 26 % (ref 12–46)
MCV: 84.8 fL (ref 78.0–100.0)
Monocytes Absolute: 0.4 10*3/uL (ref 0.1–1.0)
Monocytes Relative: 6 % (ref 3–12)
Neutro Abs: 4.4 10*3/uL (ref 1.7–7.7)
WBC: 6.9 10*3/uL (ref 4.0–10.5)

## 2012-10-06 LAB — COMPREHENSIVE METABOLIC PANEL
BUN: 8 mg/dL (ref 6–23)
CO2: 26 mEq/L (ref 19–32)
Chloride: 102 mEq/L (ref 96–112)
Creatinine, Ser: 0.76 mg/dL (ref 0.50–1.10)
GFR calc Af Amer: >90 mL/min (ref >90)
GFR calc non Af Amer: >90 mL/min (ref >90)
Glucose, Bld: 92 mg/dL (ref 70–99)
Total Bilirubin: 4.5 mg/dL — ABNORMAL HIGH (ref 0.3–1.2)

## 2012-10-06 LAB — LIPASE, BLOOD: Lipase: 24 U/L (ref 11–59)

## 2012-10-06 SURGERY — ERCP, WITH INTERVENTION IF INDICATED
Anesthesia: General

## 2012-10-06 MED ORDER — ROCURONIUM BROMIDE 100 MG/10ML IV SOLN
INTRAVENOUS | Status: DC | PRN
  Administered 2012-10-06: 5 mg via INTRAVENOUS
  Administered 2012-10-06: 25 mg via INTRAVENOUS

## 2012-10-06 MED ORDER — MORPHINE SULFATE 2 MG/ML IJ SOLN: 1.0000 mg | INTRAMUSCULAR | Status: DC | PRN

## 2012-10-06 MED ORDER — TECHNETIUM TC 99M MEBROFENIN IV KIT
5.0000 | PACK | Freq: Once | INTRAVENOUS | Status: AC | PRN
  Administered 2012-10-06: 5 via INTRAVENOUS

## 2012-10-06 MED ORDER — MIDAZOLAM HCL 2 MG/2ML IJ SOLN
1.0000 mg | INTRAMUSCULAR | Status: DC | PRN
  Administered 2012-10-06: 2 mg via INTRAVENOUS

## 2012-10-06 MED ORDER — LEVOFLOXACIN IN D5W 250 MG/50ML IV SOLN
INTRAVENOUS | Status: AC
  Filled 2012-10-06: qty 50

## 2012-10-06 MED ORDER — PROPOFOL 10 MG/ML IV BOLUS
INTRAVENOUS | Status: DC | PRN
  Administered 2012-10-06: 30 mg via INTRAVENOUS
  Administered 2012-10-06: 150 mg via INTRAVENOUS

## 2012-10-06 MED ORDER — GLYCOPYRROLATE 0.2 MG/ML IJ SOLN
INTRAMUSCULAR | Status: AC
  Filled 2012-10-06: qty 2

## 2012-10-06 MED ORDER — GLYCOPYRROLATE 0.2 MG/ML IJ SOLN
0.2000 mg | Freq: Once | INTRAMUSCULAR | Status: AC
  Administered 2012-10-06: 0.2 mg via INTRAVENOUS

## 2012-10-06 MED ORDER — LACTATED RINGERS IV SOLN
INTRAVENOUS | Status: DC
  Administered 2012-10-06: 17:00:00 via INTRAVENOUS

## 2012-10-06 MED ORDER — NEOSTIGMINE METHYLSULFATE 1 MG/ML IJ SOLN
INTRAMUSCULAR | Status: AC
  Filled 2012-10-06: qty 1

## 2012-10-06 MED ORDER — LEVOFLOXACIN IN D5W 250 MG/50ML IV SOLN
250.0000 mg | Freq: Once | INTRAVENOUS | Status: AC
  Administered 2012-10-06: 250 mg via INTRAVENOUS

## 2012-10-06 MED ORDER — HYDROMORPHONE HCL PF 1 MG/ML IJ SOLN: 1.0000 mg | INTRAMUSCULAR | Status: DC | PRN

## 2012-10-06 MED ORDER — ACETAMINOPHEN 650 MG RE SUPP: 650.0000 mg | Freq: Four times a day (QID) | RECTAL | Status: DC | PRN

## 2012-10-06 MED ORDER — MORPHINE SULFATE 4 MG/ML IJ SOLN
4.0000 mg | Freq: Once | INTRAMUSCULAR | Status: AC
  Administered 2012-10-06: 1 mg via INTRAVENOUS
  Filled 2012-10-06: qty 1

## 2012-10-06 MED ORDER — SODIUM CHLORIDE 0.9 % IV SOLN: INTRAVENOUS | Status: DC

## 2012-10-06 MED ORDER — GLYCOPYRROLATE 0.2 MG/ML IJ SOLN
INTRAMUSCULAR | Status: DC | PRN
  Administered 2012-10-06: 0.4 mg via INTRAVENOUS

## 2012-10-06 MED ORDER — GLYCOPYRROLATE 0.2 MG/ML IJ SOLN
INTRAMUSCULAR | Status: AC
  Filled 2012-10-06: qty 1

## 2012-10-06 MED ORDER — MIDAZOLAM HCL 2 MG/2ML IJ SOLN
INTRAMUSCULAR | Status: AC
  Filled 2012-10-06: qty 2

## 2012-10-06 MED ORDER — FENTANYL CITRATE 0.05 MG/ML IJ SOLN
INTRAMUSCULAR | Status: AC
  Filled 2012-10-06: qty 2

## 2012-10-06 MED ORDER — ONDANSETRON HCL 4 MG/2ML IJ SOLN
4.0000 mg | Freq: Four times a day (QID) | INTRAMUSCULAR | Status: DC | PRN
  Administered 2012-10-06: 4 mg via INTRAVENOUS

## 2012-10-06 MED ORDER — ONDANSETRON HCL 4 MG/2ML IJ SOLN
INTRAMUSCULAR | Status: AC
  Filled 2012-10-06: qty 2

## 2012-10-06 MED ORDER — ACETAMINOPHEN 325 MG PO TABS: 650.0000 mg | ORAL_TABLET | Freq: Four times a day (QID) | ORAL | Status: DC | PRN

## 2012-10-06 MED ORDER — MORPHINE SULFATE 2 MG/ML IJ SOLN
2.0000 mg | Freq: Once | INTRAMUSCULAR | Status: AC
  Administered 2012-10-06: 2 mg via INTRAVENOUS
  Filled 2012-10-06: qty 1

## 2012-10-06 MED ORDER — SODIUM CHLORIDE 0.9 % IV SOLN
1000.0000 mL | Freq: Once | INTRAVENOUS | Status: AC
  Administered 2012-10-06: 1000 mL via INTRAVENOUS

## 2012-10-06 MED ORDER — SODIUM CHLORIDE 0.9 % IJ SOLN
INTRAMUSCULAR | Status: AC
  Filled 2012-10-06: qty 20

## 2012-10-06 MED ORDER — ONDANSETRON HCL 4 MG/2ML IJ SOLN
4.0000 mg | Freq: Four times a day (QID) | INTRAMUSCULAR | Status: DC | PRN
  Filled 2012-10-06: qty 2

## 2012-10-06 MED ORDER — POTASSIUM CHLORIDE IN NACL 20-0.9 MEQ/L-% IV SOLN
INTRAVENOUS | Status: DC
  Administered 2012-10-06: 22:00:00 via INTRAVENOUS

## 2012-10-06 MED ORDER — FENTANYL CITRATE 0.05 MG/ML IJ SOLN
INTRAMUSCULAR | Status: DC | PRN
  Administered 2012-10-06: 25 ug via INTRAVENOUS
  Administered 2012-10-06 (×2): 50 ug via INTRAVENOUS

## 2012-10-06 MED ORDER — OXYCODONE-ACETAMINOPHEN 5-325 MG PO TABS: 1.0000 | ORAL_TABLET | Freq: Once | ORAL | Status: DC

## 2012-10-06 MED ORDER — ROCURONIUM BROMIDE 50 MG/5ML IV SOLN
INTRAVENOUS | Status: AC
  Filled 2012-10-06: qty 1

## 2012-10-06 MED ORDER — PROPOFOL 10 MG/ML IV EMUL
INTRAVENOUS | Status: AC
  Filled 2012-10-06: qty 20

## 2012-10-06 MED ORDER — LIDOCAINE HCL (CARDIAC) 10 MG/ML IV SOLN
INTRAVENOUS | Status: DC | PRN
Start: 2012-10-06 — End: 2012-10-06
  Administered 2012-10-06: 20 mg via INTRAVENOUS

## 2012-10-06 MED ORDER — STERILE WATER FOR IRRIGATION IR SOLN
Status: DC | PRN
  Administered 2012-10-06: 17:00:00

## 2012-10-06 MED ORDER — FENTANYL CITRATE 0.05 MG/ML IJ SOLN: 25.0000 ug | INTRAMUSCULAR | Status: DC | PRN

## 2012-10-06 MED ORDER — ONDANSETRON HCL 4 MG/2ML IJ SOLN: 4.0000 mg | Freq: Three times a day (TID) | INTRAMUSCULAR | Status: DC | PRN

## 2012-10-06 MED ORDER — SODIUM CHLORIDE 0.9 % IV SOLN
1000.0000 mL | INTRAVENOUS | Status: DC
  Administered 2012-10-06: 1000 mL via INTRAVENOUS

## 2012-10-06 MED ORDER — MORPHINE SULFATE 2 MG/ML IJ SOLN
1.0000 mg | Freq: Once | INTRAMUSCULAR | Status: AC
  Administered 2012-10-06: 1 mg via INTRAVENOUS
  Filled 2012-10-06: qty 1

## 2012-10-06 MED ORDER — ONDANSETRON HCL 4 MG/2ML IJ SOLN: 4.0000 mg | Freq: Once | INTRAMUSCULAR | Status: DC | PRN

## 2012-10-06 MED ORDER — ONDANSETRON HCL 4 MG PO TABS: 4.0000 mg | ORAL_TABLET | Freq: Four times a day (QID) | ORAL | Status: DC | PRN

## 2012-10-06 MED ORDER — SODIUM CHLORIDE 0.9 % IV SOLN
INTRAVENOUS | Status: DC | PRN
  Administered 2012-10-06: 17:00:00

## 2012-10-06 MED ORDER — ONDANSETRON HCL 4 MG/2ML IJ SOLN
4.0000 mg | Freq: Once | INTRAMUSCULAR | Status: AC
  Administered 2012-10-06: 4 mg via INTRAVENOUS
  Filled 2012-10-06: qty 2

## 2012-10-06 MED ORDER — NEOSTIGMINE METHYLSULFATE 1 MG/ML IJ SOLN
INTRAMUSCULAR | Status: DC | PRN
  Administered 2012-10-06: 2 mg via INTRAVENOUS

## 2012-10-06 MED ORDER — NEPRO/CARBSTEADY PO LIQD
237.0000 mL | Freq: Three times a day (TID) | ORAL | Status: DC | PRN
  Filled 2012-10-06: qty 237

## 2012-10-06 MED ORDER — LIDOCAINE HCL (PF) 1 % IJ SOLN
INTRAMUSCULAR | Status: AC
  Filled 2012-10-06: qty 5

## 2012-10-06 SURGICAL SUPPLY — 32 items
BALLN DILATOR CRE WIREGUIDE (BALLOONS) ×2
BALLN RETRIEVAL 12X15 (BALLOONS) ×1 IMPLANT
BALLOON DILATOR CRE WIREGUIDE (BALLOONS) IMPLANT
BALN RTRVL 200 6-7FR 12-15 (BALLOONS) ×1
BASKET TRAPEZOID 3X6 (MISCELLANEOUS) IMPLANT
BASKET TRAPEZOID LITHO 2.0X5 (MISCELLANEOUS) ×1 IMPLANT
BASKET TRAPEZOID LITHO 2.5X5 (MISCELLANEOUS) IMPLANT
BLOCK BITE 60FR ADLT L/F BLUE (MISCELLANEOUS) ×1 IMPLANT
BSKT STON RTRVL TRAPEZOID 2X5 (MISCELLANEOUS) ×1
BSKT STON RTRVL TRAPEZOID 3X6 (MISCELLANEOUS)
DEVICE INFLATION ENCORE 26 (MISCELLANEOUS) IMPLANT
DEVICE LOCKING W-BIOPSY CAP (MISCELLANEOUS) ×2 IMPLANT
FLOOR PAD 36X40 (MISCELLANEOUS) ×2
GUIDEWIRE HYDRA JAGWIRE .35 (WIRE) IMPLANT
GUIDEWIRE JAG HINI 025X260CM (WIRE) IMPLANT
NDL HYPO 18GX1.5 BLUNT FILL (NEEDLE) IMPLANT
NEEDLE HYPO 18GX1.5 BLUNT FILL (NEEDLE) IMPLANT
PAD FLOOR 36X40 (MISCELLANEOUS) IMPLANT
SNARE ROTATE MED OVAL 20MM (MISCELLANEOUS) IMPLANT
SNARE SHORT THROW 27M MED OVAL (MISCELLANEOUS) IMPLANT
SPHINCTEROTOME AUTOTOME .25 (MISCELLANEOUS) IMPLANT
SPHINCTEROTOME HYDRATOME 44 (MISCELLANEOUS) ×4 IMPLANT
SPONGE GAUZE 4X4 12PLY (GAUZE/BANDAGES/DRESSINGS) ×1 IMPLANT
SYR 20CC LL (SYRINGE) IMPLANT
SYR 3ML LL SCALE MARK (SYRINGE) IMPLANT
SYR 50ML LL SCALE MARK (SYRINGE) ×2 IMPLANT
SYR INFLATION 60ML (SYRINGE) ×1 IMPLANT
SYSTEM CONTINUOUS INJECTION (MISCELLANEOUS) ×2 IMPLANT
TUBING ENDO SMARTCAP PENTAX (MISCELLANEOUS) ×1 IMPLANT
WALLSTENT METAL COVERED 10X60 (STENTS) IMPLANT
WALLSTENT METAL COVERED 10X80 (STENTS) IMPLANT
WATER STERILE IRR 1000ML POUR (IV SOLUTION) ×1 IMPLANT

## 2012-10-06 NOTE — Progress Notes (Signed)
Cleared for d/c to room per Dr Tollie Eth.

## 2012-10-06 NOTE — Anesthesia Procedure Notes (Signed)
Procedure Name: Intubation Date/Time: 10/06/2012 4:52 PM Performed by: Franco Nones Pre-anesthesia Checklist: Patient identified, Patient being monitored, Timeout performed, Emergency Drugs available and Suction available Patient Re-evaluated:Patient Re-evaluated prior to inductionOxygen Delivery Method: Circle System Utilized Preoxygenation: Pre-oxygenation with 100% oxygen Intubation Type: IV induction, Rapid sequence and Cricoid Pressure applied Ventilation: Mask ventilation without difficulty Laryngoscope Size: Miller and 2 Grade View: Grade I Tube type: Oral Tube size: 7.0 mm Number of attempts: 1 Airway Equipment and Method: stylet Placement Confirmation: ETT inserted through vocal cords under direct vision,  positive ETCO2 and breath sounds checked- equal and bilateral Secured at: 21 cm Tube secured with: Tape Dental Injury: Teeth and Oropharynx as per pre-operative assessment

## 2012-10-06 NOTE — Anesthesia Postprocedure Evaluation (Signed)
Anesthesia Post Note  Patient: Tanya Sanders  Procedure(s) Performed: Procedure(s) (LRB): ENDOSCOPIC RETROGRADE CHOLANGIOPANCREATOGRAPHY (ERCP) (N/A) SPHINCTEROTOMY (N/A) REMOVAL OF STONES (N/A)  Anesthesia type: General  Patient location: PACU  Post pain: Pain level controlled  Post assessment: Post-op Vital signs reviewed, Patient's Cardiovascular Status Stable, Respiratory Function Stable, Patent Airway, No signs of Nausea or vomiting and Pain level controlled   Post vital signs: Reviewed and stable  Level of consciousness: awake and alert   Complications: No apparent anesthesia complications

## 2012-10-06 NOTE — Anesthesia Preprocedure Evaluation (Addendum)
Anesthesia Evaluation  Patient identified by MRN, date of birth, ID band Patient awake    Reviewed: Allergy & Precautions, H&P , NPO status , Patient's Chart, lab work & pertinent test results  Airway Mallampati: I TM Distance: >3 FB     Dental  (+) Teeth Intact   Pulmonary neg pulmonary ROS, Current Smoker,  breath sounds clear to auscultation        Cardiovascular negative cardio ROS  Rhythm:Regular Rate:Normal     Neuro/Psych    GI/Hepatic negative GI ROS, Neg liver ROS,   Endo/Other  negative endocrine ROS  Renal/GU negative Renal ROS     Musculoskeletal negative musculoskeletal ROS (+)   Abdominal Normal abdominal exam  (+)   Peds  Hematology negative hematology ROS (+)   Anesthesia Other Findings   Reproductive/Obstetrics negative OB ROS                          Anesthesia Physical Anesthesia Plan  ASA: I and emergent  Anesthesia Plan: General   Post-op Pain Management:    Induction: Intravenous  Airway Management Planned: Oral ETT  Additional Equipment:   Intra-op Plan:   Post-operative Plan: Extubation in OR  Informed Consent: I have reviewed the patients History and Physical, chart, labs and discussed the procedure including the risks, benefits and alternatives for the proposed anesthesia with the patient or authorized representative who has indicated his/her understanding and acceptance.     Plan Discussed with: CRNA  Anesthesia Plan Comments:        Anesthesia Quick Evaluation

## 2012-10-06 NOTE — Transfer of Care (Signed)
Immediate Anesthesia Transfer of Care Note  Patient: Tanya Sanders  Procedure(s) Performed: Procedure(s) (LRB): ENDOSCOPIC RETROGRADE CHOLANGIOPANCREATOGRAPHY (ERCP) (N/A) SPHINCTEROTOMY (N/A) REMOVAL OF STONES (N/A)  Patient Location: PACU  Anesthesia Type: General  Level of Consciousness: awake  Airway & Oxygen Therapy: Patient Spontanous Breathing and non-rebreather face mask  Post-op Assessment: Report given to PACU RN, Post -op Vital signs reviewed and stable and Patient moving all extremities  Post vital signs: Reviewed and stable  Complications: No apparent anesthesia complications

## 2012-10-06 NOTE — Consult Note (Addendum)
Referring Provider: Dione Booze, MD Primary Care Physician:  Mickie Hillier, MD Primary Gastroenterologist:  Roetta Sessions, MD  Reason for Consultation:  abd pain, elevated LFTs, gb fossa fluid collection  HPI: Tanya Sanders is a 27 y.o. female who presents with acute onset abdominal pain which began yesterday around 5am. She had laparoscopic cholecystectomy 09/22/12 by Dr. Barbaraann Barthel. She had gallstones. Her LFTs were normal prior to surgery. On 09/23/12 her ALT was 43. She did well postoperative and was off pain medication one week ago. She woke up yesterday at 5am with severe right upper quadrant pain, nausea. Pain lessened but continued to wax/wane over the last 24 hours. No vomiting, fever, change in bowels, melena, brbpr.    Prior to Admission medications   Medication Sig Start Date End Date Taking? Authorizing Provider  oxyCODONE-acetaminophen (PERCOCET) 10-325 MG per tablet Take 1 tablet by mouth every 4 (four) hours as needed for pain. 09/23/12  Yes Marlane Hatcher, MD    Current Facility-Administered Medications  Medication Dose Route Frequency Provider Last Rate Last Dose  . 0.9 %  sodium chloride infusion  1,000 mL Intravenous Continuous Dione Booze, MD 125 mL/hr at 10/06/12 1229 1,000 mL at 10/06/12 1229   Current Outpatient Prescriptions  Medication Sig Dispense Refill  . oxyCODONE-acetaminophen (PERCOCET) 10-325 MG per tablet Take 1 tablet by mouth every 4 (four) hours as needed for pain.  30 tablet  0    Allergies as of 10/06/2012 - Review Complete 10/06/2012  Allergen Reaction Noted  . Bactrim (sulfamethoxazole w-trimethoprim) Rash 09/12/2012  . Penicillins Rash 09/12/2012  . Sulfa antibiotics Rash 09/12/2012    History reviewed. No pertinent past medical history.  Past Surgical History  Procedure Laterality Date  . Cesarean section    . Cholecystectomy N/A 09/22/2012    Procedure: LAPAROSCOPIC CHOLECYSTECTOMY;  Surgeon: Marlane Hatcher, MD;  Location:  [facility];  Service: General;  Laterality: N/A;    No family history on file.  History   Social History  . Marital Status: Married    Spouse Name: N/A    Number of Children: N/A  . Years of Education: N/A   Occupational History  . RN    Social History Main Topics  . Smoking status: Current Every Day Smoker -- 0.50 packs/day    Types: Cigarettes  . Smokeless tobacco: Not on file  . Alcohol Use: No  . Drug Use: No  . Sexually Active: Yes    Birth Control/ Protection: Pill   Other Topics Concern  . Not on file   Social History Narrative  . No narrative on file     ROS:  General: Negative for anorexia, weight loss, fever, chills, fatigue, weakness. Eyes: Negative for vision changes.  ENT: Negative for hoarseness, difficulty swallowing , nasal congestion. CV: Negative for chest pain, angina, palpitations, dyspnea on exertion, peripheral edema.  Respiratory: Negative for dyspnea at rest, dyspnea on exertion, cough, sputum, wheezing.  GI: See history of present illness. GU:  Negative for dysuria, hematuria, urinary incontinence, urinary frequency, nocturnal urination.  MS: Negative for joint pain, low back pain.  Derm: Negative for rash or itching.  Neuro: Negative for weakness, abnormal sensation, seizure, frequent headaches, memory loss, confusion.  Psych: Negative for anxiety, depression, suicidal ideation, hallucinations.  Endo: Negative for unusual weight change.  Heme: Negative for bruising or bleeding. Allergy: Negative for rash or hives.       Physical Examination: Vital signs in last 24 hours: Temp:  [98.5 F (36.9 C)]  98.5 F (36.9 C) (05/16 0943) Pulse Rate:  [77-104] 77 (05/16 1233) Resp:  [18] 18 (05/16 1233) BP: (115-116)/(72-73) 115/73 mmHg (05/16 1233) SpO2:  [98 %-100 %] 100 % (05/16 1233) Weight:  [173 lb (78.472 kg)] 173 lb (78.472 kg) (05/16 0946)            PATIENT SEEN IN [facility]. PHYSICAL EXAM COULD NOT BE FORMALLY DONE AS SHE WAS HAVING  HIDA.     General: Well-nourished, well-developed, WF in NAD. At 1545, patient seen a nuclear medicine. She is alert and conversant. Abdomen nondistended. she has bowel sounds some epigastric and right upper quadrant tenderness without appreciable mass or organomegaly.  No rebound. Lab Results: CBC  Recent Labs  10/06/12 0955  WBC 6.9  HGB 13.5  HCT 39.5  MCV 84.8  PLT 287   BMET  Recent Labs  10/06/12 0955  NA 139  K 3.9  CL 102  CO2 26  GLUCOSE 92  BUN 8  CREATININE 0.76  CALCIUM 9.4   LFT  Recent Labs  10/06/12 0955  BILITOT 4.5*  ALKPHOS 179*  AST 768*  ALT 1282*  PROT 7.1  ALBUMIN 3.9   Lab Results  Component Value Date   LIPASE 24 10/06/2012    PT/INR No results found for this basename: LABPROT, INR,  in the last 72 hours    Imaging Studies: US Abdomen Complete  10/06/2012   *RADIOLOGY REPORT*  Clinical Data:  Abdominal pain, elevated LFTs, cholecystectomy 2 weeks ago  ABDOMINAL ULTRASOUND COMPLETE  Comparison:  09/12/2012  Findings:  Gallbladder:  Within the gallbladder fossa, there is a heterogeneous complex fluid collection with internal echogenic debris measuring 6 x 4 cm.  This could represent postoperative gallbladder fossa abscess, biloma, or hematoma.  Common Bile Duct:  Mildly prominent, measuring 9 mm in diameter.  Liver: No focal mass lesion identified.  Within normal limits in parenchymal echogenicity.  IVC:  Appears normal.  Pancreas:  No abnormality identified.  Spleen:  Within normal limits in size and echotexture.  Right kidney:  Normal in size and parenchymal echogenicity.  No evidence of mass or hydronephrosis.  Left kidney:  Normal in size and parenchymal echogenicity.  No evidence of mass or hydronephrosis.  Abdominal Aorta:  No aneurysm identified.  IMPRESSION: 6 x 4 cm complex gallbladder fossa fluid collection postoperatively concerning for abscess, biloma, or hematoma.  Mild common bile duct dilatation.   Original Report  Authenticated By: Judie Petit. Miles Costain, M.D.    Impression: 27 y/o female s/p laparoscopic cholecystectomy on 09/22/12 who presents with acute onset RUQ pain. She has 6X4cm complex gallbladder fossa fluid collection and mild CBD dilation on current abd u/s. HIDA in progress. She has new elevation of LFTs across the board. Needs to be evaluated for biliary obstruction +/- bile leak. Would suspect she would have been more acutely ill post-operatively if she had CBD damage.   Last oral intake was at 7am this morning (1/2 cup of coffee). No solid food in 24 hours.  Plan: 1. Await HIDA results.    LOS: 0 days   Tana Coast  10/06/2012, 2:40 PM  Attending note:  Discussed at length with doctors Malvin Johns and [place]. Reviewed HIDA and ultrasound. Patient has a 9 mm bile duct on imaging which is abnormal for a 27 year old lady. Moreover, HIDA reveals a high grade extrahepatic biliary obstruction-etiology uncertain at this time. Postoperative bile duct injury or common duct stone disease not excluded at this time. However, I favor the latter  rather than the former. Also, postoperative bile leak is not excluded either at this time.  This lady needs an ERCP for both therapeutic and diagnostic purposes. I discussed this approach with both the patient and her husband Tanya Sanders [phone]).  I reviewed the potential for sphincterotomy, stone extraction and even stent placement. Risks, benefits, limitations, imponderables have been discussed. Questions have been answered. I I discussed in some detail an at least 1/10 chance of pancreatitis with his procedure. The patient and spouse's questions have been answered. All parties are agreeable.  Will plan for Unasyn 1.5 g IV prior to the procedure. However, since patient is allergic to PCN, will go with a single pre-operative dose of levaquin.

## 2012-10-06 NOTE — Op Note (Signed)
NAME:  Tanya Sanders, Tanya Sanders           ACCOUNT NO.:  [id number]  MEDICAL RECORD NO.:  [id number]  LOCATION:  [facility]                          FACILITY:  [facility]  PHYSICIAN:  R. Roetta Sessions, MD FACP FACGDATE OF BIRTH:  1985-06-03  DATE OF PROCEDURE:  10/06/2012 DATE OF DISCHARGE:                              OPERATIVE REPORT   PROCEDURE:  ERCP with biliary sphincterotomy, sphincterotomy balloon dilation, and balloon/basket stone extraction.  INDICATIONS FOR PROCEDURE:  A 27 year old lady status post laparoscopic cholecystectomy 2 weeks ago for symptomatic cholelithiasis.  She did well until yesterday when she developed acute onset epigastric right upper quadrant abdominal pain.  Evaluation in the ED today demonstrated possible fluid collection in the gallbladder fossa and a markedly elevated aminotransferase in the 1000 range and a bilirubin of 4.5.  HIDA demonstrated high-grade extrahepatic biliary obstruction.  Further information cannot be gleaned from this study.  It is felt the patient had either extrahepatic biliary obstruction from a common bile duct stone or less likely some type of bile duct injury.  A biliary leak could not be ruled out with the information that had been gathered in the emergency department.  Serum lipase was normal.  After some discussion with Dr. Tyron Russell and Dr. Malvin Johns, it is felt that ERCP was indicated and would give [place] useful diagnostic information and will allow therapeutic intervention.  This approach was discussed with the patient and the patient's husband at length.  We talked about the risk, benefits, limitations, alternatives, imponderables of this approach, specifically talked about a 1 in 10 chance pancreatitis, reaction to medications, and perforation risks.  All parties questions answered and all parties agreeable.  PROCEDURE NOTE:  General endotracheal anesthesia was induced by Dr. Tollie Eth and associates.  The patient was placed in semiprone  position on OR table.  The patient received Levaquin 250 mg IV prior to the procedure.  INSTRUMENT:  Pentax video chip system.  FINDINGS:  Cursory examination of the distal esophagus and stomach revealed no abnormalities.  Pyloric channel was patent, easily traversed.  The ampulla of Vater was readily identified on the medial wall of the 2nd portion of the duodenum.  The scope was pulled back to the short position, 55 cm from the incisors.  Scout film was taken.  The ampulla was approached with the Microvasive sphincter tone utilizing guidewire palpation.  I assessed for bile duct access initially.  The guidewire went into the pancreatic duct.  Very small single injection was made.  It did opacify and confirmed the wire was in the pancreatic duct.  I reapproached more tangentially at the 11 o'clock position and with guidewire palpation achieved deep biliary cannulation. Cholangiogram was performed.  The extrahepatic and intrahepatic biliary tree filled out nicely.  There was relatively minimal dilation of the common bile duct at approximately 7 mm by estimation.  There was 1 or possibly 2 small filling defects in the distal common bile duct.  The cystic duct was somewhat ectatic and dilated.  There was no evidence of surgical injury to the biliary tree.  There was no evidence of bile leak.  Safety wire was deployed deep in the biliary tree.  The sphincterotome was pulled back across the ampullary  orifice at 12 o'clock position and an 8-9 mm sphincterotomy was performed using the Erbe unit.  This was done without difficulty or apparent complication. Subsequently extractor balloon was advanced and the confluence was inflated to accommodate the size of the bile duct and balloon cholangiogram was performed 3 times.  This was associated with delivery of small yellow cholesterol stones.  Subsequently, the distal duct was trolled with the trapezoid basket.  I also injected through the  rapezoid basket.  There appeared to be 1 small persisting filling defect in the distal bile duct, which was persisting even after a 4th sweep with the extractor balloon.  I decided to go ahead and open up the sphincterotomy further with balloon dilation as I did not feel further cutting would be safe given limited intramural segment.  Consequently I advanced CRE balloon and advanced it under fluroscopic control and inflated the balloon to 8 mm x1 minute and then 10 mm x1 minute and took it down.  There was very minimal bleeding with this maneuver.  This opened up the distal bile duct even further than the sphincterotomy itself.  When I removed the dilating balloon, there was a small stone fragment on the balloon.  At this point, the biliary tree appeared to drain very well and the procedure was concluded.  The patient was taken to PACU in stable condition.  IMPRESSION:  Mildly dilated bile duct with filling defects consistent with common duct stones, status post biliary sphincterotomy with basket/balloon stone extraction, and sphincterotomy balloon dilation.  Partial opacification of the pancreatic duct revealed no obvious PD abnormalities.  Final interpretation of films pending with the radiologist.  RECOMMENDATIONS:  Observe overnight, allow clear liquid diet, repeat labs tomorrow morning.  I have discussed my findings and recommendations with Dr. Malvin Johns and multiple family members.     Jonathon Bellows, MD FACP [facility]     RMR/MEDQ  D:  10/06/2012  T:  10/06/2012  Job:  [id number]

## 2012-10-06 NOTE — ED Notes (Signed)
Pt to go to Room 340 from Nuclear Medication. Floor nurse notified.

## 2012-10-06 NOTE — ED Notes (Signed)
Pt c/o severe epigastric pain radiating to back. with nausea since yesterday. Pt had gallbladder removed x 2 weeks ago by Dr.Bradford-unable to be seen in office.

## 2012-10-06 NOTE — Progress Notes (Signed)
From OR. Awake. Oriented to place per nurse. Swallowing without difficulty.

## 2012-10-06 NOTE — ED Provider Notes (Signed)
History  This chart was scribed for Tanya Booze, MD,  by Concha Se, ED Scribe and Bennett Scrape, ED Scribe. The patient was seen in room APA11/APA11 and the patient's care was started at 9:42 AM    CSN: [id number]  Arrival date & time 10/06/12  0935     Chief Complaint  Patient presents with  . Abdominal Pain    The history is provided by the patient and medical records. No language interpreter was used.    HPI Comments: Tanya Sanders is a 27 y.o. female with a h/o cholecystectomy surgery 2 weeks ago who presents to the Emergency Department complaining of sudden onset, waxing and waning, constant epigastric abdominal pain that radiated straight through to the mid back with intermittent nausea that woke her up out of sleep at 5 AM yesterday morning. She reports that prolonged resting aggravates the pain and mild amount of ambulation alleviates the pain. Pt states severity of the pain 4/10 currently and a 9/10 at its worst. She denies any complications from the surgery and reports that she was recently cleared to return to her routine activities. She admits that she took one 10-325 mg percocet for same with improvement. Pt denies fever, emesis, chills, diaphoresis and fatigue.   PCP is PA with Dr. Clelia Croft  History reviewed. No pertinent past medical history.  Past Surgical History  Procedure Laterality Date  . Cesarean section    . Cholecystectomy N/A 09/22/2012    Procedure: LAPAROSCOPIC CHOLECYSTECTOMY;  Surgeon: Marlane Hatcher, MD;  Location: [facility];  Service: General;  Laterality: N/A;    No family history on file.  History  Substance Use Topics  . Smoking status: Current Every Day Smoker -- 0.50 packs/day    Types: Cigarettes  . Smokeless tobacco: Not on file  . Alcohol Use: No    No OB history provided.  Review of Systems  Constitutional: Negative for fever, chills and fatigue.  Gastrointestinal: Positive for abdominal pain. Negative for nausea and  vomiting.  All other systems reviewed and are negative.    Allergies  Bactrim; Penicillins; and Sulfa antibiotics  Home Medications   Current Outpatient Rx  Name  Route  Sig  Dispense  Refill  . oxyCODONE-acetaminophen (PERCOCET) 10-325 MG per tablet   Oral   Take 1 tablet by mouth every 4 (four) hours as needed for pain.   30 tablet   0   . phentermine 37.5 MG capsule   Oral   Take 37.5 mg by mouth daily.           BP 116/72  Pulse 104  Temp(Src) 98.5 F (36.9 C)  Resp 18  Ht 5\' 6"  (1.676 m)  Wt 173 lb (78.472 kg)  BMI 27.94 kg/m2  SpO2 98%  LMP 09/13/2012  Physical Exam  Nursing note and vitals reviewed. Constitutional: She is oriented to person, place, and time. She appears well-developed and well-nourished. No distress.  HENT:  Head: Normocephalic and atraumatic.  Eyes: Conjunctivae and EOM are normal.  Neck: Neck supple. No tracheal deviation present.  Cardiovascular: Normal rate and regular rhythm.   Pulmonary/Chest: Effort normal and breath sounds normal. No respiratory distress.  Abdominal: Soft. There is no rebound and no guarding.  Moderate epigastric tenderness. Bowel sounds decreased.  Musculoskeletal: Normal range of motion.  Neurological: She is alert and oriented to person, place, and time.  Skin: Skin is warm and dry.  Psychiatric: She has a normal mood and affect. Her behavior is normal.  ED Course  Procedures (including critical care time)  DIAGNOSTIC STUDIES: Oxygen Saturation is 98% on room air, normal by my interpretation.    COORDINATION OF CARE: 9:48 AM-Discussed ED treatment with pt and pt agrees. Pt declined pain medications.  Results for orders placed during the hospital encounter of 10/06/12  CBC WITH DIFFERENTIAL      Result Value Range   WBC 6.9  4.0 - 10.5 K/uL   RBC 4.66  3.87 - 5.11 MIL/uL   Hemoglobin 13.5  12.0 - 15.0 g/dL   HCT 16.1  09.6 - 04.5 %   MCV 84.8  78.0 - 100.0 fL   MCH 29.0  26.0 - 34.0 pg   MCHC  34.2  30.0 - 36.0 g/dL   RDW 40.9  81.1 - 91.4 %   Platelets 287  150 - 400 K/uL   Neutrophils Relative % 65  43 - 77 %   Neutro Abs 4.4  1.7 - 7.7 K/uL   Lymphocytes Relative 26  12 - 46 %   Lymphs Abs 1.8  0.7 - 4.0 K/uL   Monocytes Relative 6  3 - 12 %   Monocytes Absolute 0.4  0.1 - 1.0 K/uL   Eosinophils Relative 3  0 - 5 %   Eosinophils Absolute 0.2  0.0 - 0.7 K/uL   Basophils Relative 1  0 - 1 %   Basophils Absolute 0.0  0.0 - 0.1 K/uL  COMPREHENSIVE METABOLIC PANEL      Result Value Range   Sodium 139  135 - 145 mEq/L   Potassium 3.9  3.5 - 5.1 mEq/L   Chloride 102  96 - 112 mEq/L   CO2 26  19 - 32 mEq/L   Glucose, Bld 92  70 - 99 mg/dL   BUN 8  6 - 23 mg/dL   Creatinine, Ser 7.82  0.50 - 1.10 mg/dL   Calcium 9.4  8.4 - 95.6 mg/dL   Total Protein 7.1  6.0 - 8.3 g/dL   Albumin 3.9  3.5 - 5.2 g/dL   AST 213 (*) 0 - 37 U/L   ALT 1282 (*) 0 - 35 U/L   Alkaline Phosphatase 179 (*) 39 - 117 U/L   Total Bilirubin 4.5 (*) 0.3 - 1.2 mg/dL   GFR calc non Af Amer >90  >90 mL/min   GFR calc Af Amer >90  >90 mL/min  LIPASE, BLOOD      Result Value Range   Lipase 24  11 - 59 U/L   US Abdomen Complete  10/06/2012   *RADIOLOGY REPORT*  Clinical Data:  Abdominal pain, elevated LFTs, cholecystectomy 2 weeks ago  ABDOMINAL ULTRASOUND COMPLETE  Comparison:  09/12/2012  Findings:  Gallbladder:  Within the gallbladder fossa, there is a heterogeneous complex fluid collection with internal echogenic debris measuring 6 x 4 cm.  This could represent postoperative gallbladder fossa abscess, biloma, or hematoma.  Common Bile Duct:  Mildly prominent, measuring 9 mm in diameter.  Liver: No focal mass lesion identified.  Within normal limits in parenchymal echogenicity.  IVC:  Appears normal.  Pancreas:  No abnormality identified.  Spleen:  Within normal limits in size and echotexture.  Right kidney:  Normal in size and parenchymal echogenicity.  No evidence of mass or hydronephrosis.  Left kidney:   Normal in size and parenchymal echogenicity.  No evidence of mass or hydronephrosis.  Abdominal Aorta:  No aneurysm identified.  IMPRESSION: 6 x 4 cm complex gallbladder fossa fluid collection postoperatively concerning  for abscess, biloma, or hematoma.  Mild common bile duct dilatation.   Original Report Authenticated By: Judie Petit. Miles Costain, M.D.   US Abdomen Complete  09/13/2012   *RADIOLOGY REPORT*  Clinical Data:  Right upper quadrant pain.  Concern for cholelithiasis.  COMPLETE ABDOMINAL ULTRASOUND  Comparison:  None.  Findings:  Gallbladder:  Gallbladder sludge.  Suspect concurrent small stones. Some stones are mobile within the fundus.  No wall thickening or pericholecystic fluid. Sonographic Murphy's sign was not elicited.  Common bile duct: Normal, 3 mm.  Liver: Normal in echogenicity, without focal lesion.  IVC: Negative  Pancreas:  Negative  Spleen:  Normal in size and echogenicity.  Right Kidney:  11.8 cm. No hydronephrosis.  Left Kidney:  10.6 cm. No hydronephrosis.  Abdominal aorta:  Nonaneurysmal without ascites.  IMPRESSION: Gallbladder sludge and small stones.  No evidence of acute cholecystitis or biliary ductal dilatation.   Original Report Authenticated By: Jeronimo Greaves, M.D.    Images viewed by me.   1. Abdominal pain   2. Elevated liver function tests       MDM  Abdominal pain post cholecystectomy. The acute be concerned about possible spasm of sphincter of OD, possible retained common bile duct stone. She will be given IV narcotics and antiemetics. Screening labs have been obtained. Records are reviewed and cholecystectomy was done on May 2.  She got good relief of pain with the above-noted treatment. Bilirubin is noted to be elevated to 4.5 and transaminases are moderately to severely elevated. This is concerning for common bile duct stone and she is sent for ultrasound.  Ultrasound shows fluid collection and borderline dilated common bile duct. There is still a possibility of a  common bile duct stone. Case is discussed with Dr. Malvin Johns who requests consultation be obtained with gastroenterology and patient will be admitted. He also requests HIDA scan be obtained. This has been requested.    I personally performed the services described in this documentation, which was scribed in my presence. The recorded information has been reviewed and is accurate.     Tanya Booze, MD 10/06/12 1330

## 2012-10-06 NOTE — Progress Notes (Signed)
Admit 27 yr old W. Female who is S/P Lap. Gall bladder May 2.  May 3 she had normal bili and only mid elevation of LFS.  She was seen in my office 1 wk post op without any GI complaints.  She came to ER with abdominal pain for the last 24 hrs no nausea or vomiting and with a soft abdomen and normal vital signs. LFS show very elevated LFS and bili of 4.5. Hepatobiliary scan shows high grade obstruction and I suspect CBD stone.  Have consulted GI service for consideration of ERCP.  Given above history, I doubt CBD surgical injury but this will be ruled out. There is a small fluid collection which I suspect is surgicel.  Doubt abscess or leak.  Above discussed with Dr. Jena Gauss.  Filed Vitals:   10/06/12 1233  BP: 115/73  Pulse: 77  Temp:   Resp: 18

## 2012-10-07 DIAGNOSIS — K8050 Calculus of bile duct without cholangitis or cholecystitis without obstruction: Secondary | ICD-10-CM

## 2012-10-07 LAB — HEPATIC FUNCTION PANEL
AST: 264 U/L — ABNORMAL HIGH (ref 0–37)
Alkaline Phosphatase: 165 U/L — ABNORMAL HIGH (ref 39–117)
Bilirubin, Direct: 0.6 mg/dL — ABNORMAL HIGH (ref 0.0–0.3)
Total Bilirubin: 1.6 mg/dL — ABNORMAL HIGH (ref 0.3–1.2)

## 2012-10-07 NOTE — Progress Notes (Signed)
Patient discharged this am with instructions given on medications,and follow up visits,patient verbalized understanding.Prescription given to patient by Dr Malvin Johns.No c/o pain or discomfort noted. Vital signs stables. Accompanied by staff to an awaiting vehicle.

## 2012-10-07 NOTE — Progress Notes (Signed)
POD # 1 S/P ERCP for CBD stone 2 weeks after Lap Gall bladder surgery.  She has been cared for by Dr. Jena Gauss who has discharged her from his point of view.  She is doing very well after her procedure.  Clinically she has no further nausea or pain and her abdominal exam is completely benign.  She will be discharged from observation and will be followed as out patient.  Filed Vitals:   10/06/12 2100  BP: 99/65  Pulse: 77  Temp: 98.5 F (36.9 C)  Resp: 20   discharge dict #  [id number].

## 2012-10-07 NOTE — Progress Notes (Signed)
Subjective: Patient has tolerated clear liquids overnight.   Denies any abdominal pain, whatsoever. LFTs have plummeted. Serum lipase 89.    Vital signs in last 24 hours: Temp:  [97.5 F (36.4 C)-98.5 F (36.9 C)] 98.5 F (36.9 C) (05/16 2100) Pulse Rate:  [64-104] 77 (05/16 2100) Resp:  [11-20] 20 (05/16 2100) BP: (99-120)/(58-73) 99/65 mmHg (05/16 2100) SpO2:  [91 %-100 %] 99 % (05/16 2100) Weight:  [173 lb (78.472 kg)] 173 lb (78.472 kg) (05/16 1619) Last BM Date: 10/04/12 General:   Alert,  Well-developed, well-nourished, pleasant and cooperative in NAD. Accompanied by husband. Abdomen:  Flat. Laparoscopy sites look good. positive bowel sounds. Abdomen is soft entirely nontender without appreciable mass or megaly.  Extremities:  Without clubbing or edema.    Intake/Output from previous day: 05/16 0701 - 05/17 0700 In: 985 [I.V.:985] Out: 0  Intake/Output this shift:    Lab Results:  Recent Labs  10/06/12 0955  WBC 6.9  HGB 13.5  HCT 39.5  PLT 287   BMET  Recent Labs  10/06/12 0955  NA 139  K 3.9  CL 102  CO2 26  GLUCOSE 92  BUN 8  CREATININE 0.76  CALCIUM 9.4   LFT  Recent Labs  10/07/12 0623  PROT 6.1  ALBUMIN 3.2*  AST 264*  ALT PENDING  ALKPHOS 165*  BILITOT 1.6*  BILIDIR 0.6*  IBILI 1.0*   Serum lipase 89    Impression :  Symptomatic choledocholithiasis following laparoscopic cholecystectomy 2 weeks ago. She is status post biliary decompression/stone extraction via ERCP yesterday. She is doing very well at this time. Mildly elevated serum lipase is a biochemical phenomenon. Clinically, she does not have pancreatitis.   Recommendations:   Advance to a low-fat diet., If tolerated, I would suggest she could probably be discharged after lunch today per Dr. Malvin Johns.   I'd like to thank Dr. Malvin Johns for allowing to see this nice lady.

## 2012-10-07 NOTE — Discharge Summary (Signed)
NAMEMEOSHA, Tanya Sanders           ACCOUNT NO.:  [id number]  MEDICAL RECORD NO.:  [id number]  LOCATION:  A321                          FACILITY:  [facility]  PHYSICIAN:  Barbaraann Barthel, M.D. 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  She was seen in the emergency room on Oct 06, 2012, with a sudden onset of abdominal pain for the previous 24 hours or so.  This was accompanied with pain, suggesting gallbladder disease in the right upper quadrant with nausea and pain radiating to her back.  Sonogram was performed and liver function studies were done and she was found to have elevated liver function studies and a small amount of fluid in the area of her gallbladder fossa which was consistent with postoperative changes and the placement of intraoperative Surgicel.  The common bile duct was dilated to 9 mm. Postoperatively, had completely normal bilirubin.  We thought that this was likely a common bile duct stone, and there was a remote possibility of the surgical mishap with the common bile duct.  She was admitted to my service.  I obtained a nuclear scan which showed a high-grade obstruction of the common bile duct.  I consulted Dr. Jena Gauss who performed an ERCP and indeed stones were removed from the common bile duct and a sphincterotomy was performed and the patient postoperatively did well.  Her liver functions all plummeted and her bili on the first postoperative day after her ERCP was completely soft and she had no nausea and no discomfort whatsoever.  Postoperatively from her gallbladder surgery, her wounds were all clean without any sign of infection or problems with those and she is otherwise doing well.  We will follow her perioperatively and she will be discharged from the hospital  observation today.  For laboratory data, etc., please consult hospital chart.  Discharge instructions were given. They are essentially the same after her gallbladder surgery and she is to increase her diet as tolerated.     Barbaraann Barthel, M.D.     WB/MEDQ  D:  10/07/2012  T:  10/07/2012  Job:  [id number]

## 2012-10-07 NOTE — Progress Notes (Signed)
Pt up walking halls. No c/o pain or discomfort. Steady gait. No shortness of breath. No sign of distress.

## 2012-10-09 ENCOUNTER — Encounter (HOSPITAL_COMMUNITY): Payer: Self-pay | Admitting: Internal Medicine

## 2012-10-12 NOTE — Progress Notes (Signed)
UR Chart Review Completed  

## 2013-07-29 ENCOUNTER — Emergency Department (HOSPITAL_COMMUNITY)
Admission: EM | Admit: 2013-07-29 | Discharge: 2013-07-29 | Disposition: A | Discharge status: Home / Self Care | Payer: 59 | Attending: Emergency Medicine | Admitting: Emergency Medicine

## 2013-07-29 ENCOUNTER — Encounter (HOSPITAL_COMMUNITY): Payer: Self-pay | Admitting: Emergency Medicine

## 2013-07-29 DIAGNOSIS — R1011 Right upper quadrant pain: Secondary | ICD-10-CM | POA: Insufficient documentation

## 2013-07-29 DIAGNOSIS — R109 Unspecified abdominal pain: Secondary | ICD-10-CM

## 2013-07-29 DIAGNOSIS — O210 Mild hyperemesis gravidarum: Secondary | ICD-10-CM | POA: Insufficient documentation

## 2013-07-29 DIAGNOSIS — O9989 Other specified diseases and conditions complicating pregnancy, childbirth and the puerperium: Secondary | ICD-10-CM | POA: Insufficient documentation

## 2013-07-29 DIAGNOSIS — Z9089 Acquired absence of other organs: Secondary | ICD-10-CM | POA: Insufficient documentation

## 2013-07-29 DIAGNOSIS — Z880 Allergy status to penicillin: Secondary | ICD-10-CM | POA: Insufficient documentation

## 2013-07-29 DIAGNOSIS — R1013 Epigastric pain: Secondary | ICD-10-CM | POA: Insufficient documentation

## 2013-07-29 DIAGNOSIS — Z87891 Personal history of nicotine dependence: Secondary | ICD-10-CM | POA: Insufficient documentation

## 2013-07-29 LAB — COMPREHENSIVE METABOLIC PANEL
ALK PHOS: 47 U/L (ref 39–117)
ALT: 18 U/L (ref 0–35)
AST: 25 U/L (ref 0–37)
Albumin: 3.1 g/dL — ABNORMAL LOW (ref 3.5–5.2)
BILIRUBIN TOTAL: 0.2 mg/dL — AB (ref 0.3–1.2)
BUN: 12 mg/dL (ref 6–23)
CHLORIDE: 104 meq/L (ref 96–112)
CO2: 23 meq/L (ref 19–32)
Calcium: 8.6 mg/dL (ref 8.4–10.5)
Creatinine, Ser: 0.62 mg/dL (ref 0.50–1.10)
GLUCOSE: 96 mg/dL (ref 70–99)
POTASSIUM: 4.2 meq/L (ref 3.7–5.3)
Sodium: 138 mEq/L (ref 137–147)
Total Protein: 6.6 g/dL (ref 6.0–8.3)

## 2013-07-29 LAB — CBC WITH DIFFERENTIAL/PLATELET
Basophils Absolute: 0 10*3/uL (ref 0.0–0.1)
Basophils Relative: 0 % (ref 0–1)
Eosinophils Absolute: 0.1 10*3/uL (ref 0.0–0.7)
Eosinophils Relative: 1 % (ref 0–5)
HEMATOCRIT: 31.9 % — AB (ref 36.0–46.0)
HEMOGLOBIN: 11.1 g/dL — AB (ref 12.0–15.0)
LYMPHS ABS: 1.7 10*3/uL (ref 0.7–4.0)
Lymphocytes Relative: 16 % (ref 12–46)
MCH: 28.9 pg (ref 26.0–34.0)
MCHC: 34.8 g/dL (ref 30.0–36.0)
MCV: 83.1 fL (ref 78.0–100.0)
MONOS PCT: 7 % (ref 3–12)
Monocytes Absolute: 0.7 10*3/uL (ref 0.1–1.0)
NEUTROS ABS: 8 10*3/uL — AB (ref 1.7–7.7)
NEUTROS PCT: 76 % (ref 43–77)
Platelets: 255 10*3/uL (ref 150–400)
RBC: 3.84 MIL/uL — AB (ref 3.87–5.11)
RDW: 12.4 % (ref 11.5–15.5)
WBC: 10.4 10*3/uL (ref 4.0–10.5)

## 2013-07-29 LAB — LIPASE, BLOOD: LIPASE: 26 U/L (ref 11–59)

## 2013-07-29 NOTE — ED Notes (Signed)
Patient c/o upper abd pain that starts epigastric and radiates into right upper abd and into back. Patient states that she had the same pain with her gallbladder but had it removed and then had gallstone in biliary duct 2 weeks after gallbladder removed. Patient reports nausea but states that she is constantly nauseated due to morning sickness. Per patient [redacted] weeks pregnant.

## 2013-07-29 NOTE — Discharge Instructions (Signed)
Abdominal Pain, Adult °Many things can cause abdominal pain. Usually, abdominal pain is not caused by a disease and will improve without treatment. It can often be observed and treated at home. Your health care provider will do a physical exam and possibly order blood tests and X-rays to help determine the seriousness of your pain. However, in many cases, more time must pass before a clear cause of the pain can be found. Before that point, your health care provider may not know if you need more testing or further treatment. °HOME CARE INSTRUCTIONS  °Monitor your abdominal pain for any changes. The following actions may help to alleviate any discomfort you are experiencing: °· Only take over-the-counter or prescription medicines as directed by your health care provider. °· Do not take laxatives unless directed to do so by your health care provider. °· Try a clear liquid diet (broth, tea, or water) as directed by your health care provider. Slowly move to a bland diet as tolerated. °SEEK MEDICAL CARE IF: °· You have unexplained abdominal pain. °· You have abdominal pain associated with nausea or diarrhea. °· You have pain when you urinate or have a bowel movement. °· You experience abdominal pain that wakes you in the night. °· You have abdominal pain that is worsened or improved by eating food. °· You have abdominal pain that is worsened with eating fatty foods. °SEEK IMMEDIATE MEDICAL CARE IF:  °· Your pain does not go away within 2 hours. °· You have a fever. °· You keep throwing up (vomiting). °· Your pain is felt only in portions of the abdomen, such as the right side or the left lower portion of the abdomen. °· You pass bloody or black tarry stools. °MAKE SURE YOU: °· Understand these instructions.   °· Will watch your condition.   °· Will get help right away if you are not doing well or get worse.   °Document Released: 02/17/2005 Document Revised: 02/28/2013 Document Reviewed: 01/17/2013 °[employer]® Patient  Information ©2014 [employer]. ° °

## 2013-07-29 NOTE — ED Provider Notes (Signed)
CSN: [id number]     Arrival date & time 07/29/13  1509 History  This chart was scribed for Leota Jacobsen, MD by Zettie Pho, ED Scribe. This patient was seen in room APA12/APA12 and the patient's care was started at 3:47 PM.    Chief Complaint  Patient presents with  . Abdominal Pain   The history is provided by the patient. No language interpreter was used.   HPI Comments: Tanya Sanders is a 28 y.o. female with a history of cholecystectomy, cesarean section, sphincterotomy and removal of stones who is currently [redacted] weeks pregnant (no complications) who presents to the Emergency Department complaining of an intermittent, sudden-onset, sharp pain (10/10 at its worst, but 1/10 currently) to the epigastric region and RUQ of the abdomen that she states radiates to the right side of the mid-back onset earlier today. She states that her symptoms presented a few hours after eating, but denies any suspicious food intake. Patient states that her current symptoms feel exactly similar to her prior gallbladder problems. She denies taking any medications at home to treat her symptoms. Patient reports some mild nausea with associated emesis secondary to her pregnancy and denies any recent changes. She denies fever, chills, diarrhea, vaginal bleeding. Patient has no other pertinent medical history.   History reviewed. No pertinent past medical history. Past Surgical History  Procedure Laterality Date  . Cesarean section    . Cholecystectomy N/A 09/22/2012    Procedure: LAPAROSCOPIC CHOLECYSTECTOMY;  Surgeon: Scherry Ran, MD;  Location: [facility];  Service: General;  Laterality: N/A;  . Ercp N/A 10/06/2012    Procedure: ENDOSCOPIC RETROGRADE CHOLANGIOPANCREATOGRAPHY (ERCP);  Surgeon: Daneil Dolin, MD;  Location: [facility];  Service: Gastroenterology;  Laterality: N/A;  . Sphincterotomy N/A 10/06/2012    Procedure: SPHINCTEROTOMY;  Surgeon: Daneil Dolin, MD;  Location: [facility];  Service: Gastroenterology;   Laterality: N/A;  . Removal of stones N/A 10/06/2012    Procedure: REMOVAL OF STONES;  Surgeon: Daneil Dolin, MD;  Location: [facility];  Service: Gastroenterology;  Laterality: N/A;   History reviewed. No pertinent family history. History  Substance Use Topics  . Smoking status: Former Smoker -- 0.50 packs/day for 4 years    Types: Cigarettes    Quit date: 08/22/2012  . Smokeless tobacco: Never Used  . Alcohol Use: No   OB History   Grav Para Term Preterm Abortions TAB SAB Ect Mult Living   2 1 1       1      Review of Systems  Constitutional: Negative for fever and chills.  Gastrointestinal: Positive for nausea, vomiting and abdominal pain. Negative for diarrhea.  Genitourinary: Negative for vaginal bleeding.  All other systems reviewed and are negative.   Allergies  Bactrim; Penicillins; and Sulfa antibiotics  Home Medications  No current outpatient prescriptions on file.  Triage Vitals: BP 109/54  Pulse 88  Temp(Src) 97.7 F (36.5 C) (Oral)  Resp 24  Ht 5\' 5"  (1.651 m)  Wt 195 lb (88.451 kg)  BMI 32.45 kg/m2  SpO2 100%  LMP 09/13/2012  Physical Exam  Nursing note and vitals reviewed. Constitutional: She is oriented to person, place, and time. She appears well-developed and well-nourished.  Non-toxic appearance. No distress.  HENT:  Head: Normocephalic and atraumatic.  Eyes: Conjunctivae, EOM and lids are normal. Pupils are equal, round, and reactive to light.  Neck: Normal range of motion. Neck supple. No tracheal deviation present. No mass present.  Cardiovascular: Normal rate, regular  rhythm and normal heart sounds.  Exam reveals no gallop.   No murmur heard. Pulmonary/Chest: Effort normal and breath sounds normal. No stridor. No respiratory distress. She has no decreased breath sounds. She has no wheezes. She has no rhonchi. She has no rales.  Abdominal: Soft. Normal appearance and bowel sounds are normal. She exhibits no distension. There is tenderness. There  is no rebound, no guarding and no CVA tenderness.  Mild epigastric tenderness to palpation.   Musculoskeletal: Normal range of motion. She exhibits no edema and no tenderness.  Neurological: She is alert and oriented to person, place, and time. She has normal strength. No cranial nerve deficit or sensory deficit. GCS eye subscore is 4. GCS verbal subscore is 5. GCS motor subscore is 6.  Skin: Skin is warm and dry. No abrasion and no rash noted.  Psychiatric: She has a normal mood and affect. Her speech is normal and behavior is normal.    ED Course  Procedures (including critical care time)  DIAGNOSTIC STUDIES: Oxygen Saturation is 100% on room air, normal by my interpretation.    COORDINATION OF CARE: 3:50 PM- Will order blood labs (CBC, CMP, lipase). Offered patient Zofran, but she states it is unnecessary at this time. Discussed that an ultrasound will likely not be necessary, pending lab results. Discussed treatment plan with patient at bedside and patient verbalized agreement.   4:45 PM- Patient reports that she currently feels well. Discussed that lab results were normal. Patient is stable for discharge. Discussed treatment plan with patient at bedside and patient verbalized agreement.    Labs Review Labs Reviewed  CBC WITH DIFFERENTIAL - Abnormal; Notable for the following:    RBC 3.84 (*)    Hemoglobin 11.1 (*)    HCT 31.9 (*)    Neutro Abs 8.0 (*)    All other components within normal limits  COMPREHENSIVE METABOLIC PANEL - Abnormal; Notable for the following:    Albumin 3.1 (*)    Total Bilirubin 0.2 (*)    All other components within normal limits  LIPASE, BLOOD   Imaging Review No results found.   EKG Interpretation None      MDM   Final diagnoses:  None      I personally performed the services described in this documentation, which was scribed in my presence. The recorded information has been reviewed and is accurate.    4:48 PM His last reviewed  and no acute abnormalities. She has no evidence of surgical abdomen is to for discharge.  Leota Jacobsen, MD 07/29/13 [phone]

## 2013-07-29 NOTE — ED Notes (Signed)
Patient given discharge instruction, verbalized understand. IV removed, band aid applied. Patient ambulatory out of the department.  

## 2014-01-08 ENCOUNTER — Other Ambulatory Visit: Payer: Self-pay | Admitting: Obstetrics & Gynecology

## 2014-01-21 ENCOUNTER — Encounter (HOSPITAL_COMMUNITY): Payer: Self-pay | Admitting: Pharmacist

## 2014-01-29 ENCOUNTER — Encounter (HOSPITAL_COMMUNITY)
Admission: RE | Admit: 2014-01-29 | Discharge: 2014-01-29 | Disposition: A | Discharge status: Home / Self Care | Payer: 59 | Source: Ambulatory Visit | Attending: Obstetrics & Gynecology | Admitting: Obstetrics & Gynecology

## 2014-01-29 ENCOUNTER — Encounter (HOSPITAL_COMMUNITY): Payer: Self-pay

## 2014-01-29 LAB — CBC
HCT: 33.3 % — ABNORMAL LOW (ref 36.0–46.0)
HEMOGLOBIN: 11 g/dL — AB (ref 12.0–15.0)
MCH: 27.8 pg (ref 26.0–34.0)
MCHC: 33 g/dL (ref 30.0–36.0)
MCV: 84.3 fL (ref 78.0–100.0)
Platelets: 253 10*3/uL (ref 150–400)
RBC: 3.95 MIL/uL (ref 3.87–5.11)
RDW: 12.9 % (ref 11.5–15.5)
WBC: 8.5 10*3/uL (ref 4.0–10.5)

## 2014-01-29 LAB — ABO/RH: ABO/RH(D): A POS

## 2014-01-29 LAB — TYPE AND SCREEN
ABO/RH(D): A POS
ANTIBODY SCREEN: NEGATIVE

## 2014-01-29 LAB — RPR

## 2014-01-29 NOTE — Patient Instructions (Addendum)
   Your procedure is scheduled on: SEPT 10 AT 945AM  Enter through the Main Entrance of [facility] at: 815AM SEPT 10 Pick up the phone at the desk and dial [phone] and inform us of your arrival.  Please call this number if you have any problems the morning of surgery: [phone]  Remember: Do not eat food after midnight:SEPT 9 Do not drink clear liquids after:SPET 9 Take these medicines the morning of surgery with a SIP OF WATER:  Do not wear jewelry, make-up, or FINGER nail polish No metal in your hair or on your body. Do not wear lotions, powders, perfumes.  You may wear deodorant.  Do not bring valuables to the hospital. Contacts, dentures or bridgework may not be worn into surgery.  Leave suitcase in the car. After Surgery it may be brought to your room. For patients being admitted to the hospital, checkout time is 11:00am the day of discharge.    Patients discharged on the day of surgery will not be allowed to drive home.

## 2014-01-30 MED ORDER — GENTAMICIN SULFATE 40 MG/ML IJ SOLN
INTRAMUSCULAR | Status: AC
  Administered 2014-01-31: 117 mL via INTRAVENOUS
  Filled 2014-01-30: qty 11

## 2014-01-31 ENCOUNTER — Inpatient Hospital Stay (HOSPITAL_COMMUNITY): Payer: 59 | Admitting: Anesthesiology

## 2014-01-31 ENCOUNTER — Inpatient Hospital Stay (HOSPITAL_COMMUNITY)
Admission: RE | Admit: 2014-01-31 | Discharge: 2014-02-02 | DRG: 766 | Disposition: A | Discharge status: Home / Self Care | Payer: 59 | Source: Ambulatory Visit | Attending: Obstetrics & Gynecology | Admitting: Obstetrics & Gynecology

## 2014-01-31 ENCOUNTER — Encounter (HOSPITAL_COMMUNITY)
Admission: RE | Disposition: A | Discharge status: Home / Self Care | Payer: Self-pay | Source: Ambulatory Visit | Attending: Obstetrics & Gynecology

## 2014-01-31 ENCOUNTER — Encounter (HOSPITAL_COMMUNITY): Payer: Self-pay

## 2014-01-31 ENCOUNTER — Encounter (HOSPITAL_COMMUNITY): Payer: 59 | Admitting: Anesthesiology

## 2014-01-31 DIAGNOSIS — Z6839 Body mass index (BMI) 39.0-39.9, adult: Secondary | ICD-10-CM

## 2014-01-31 DIAGNOSIS — Z880 Allergy status to penicillin: Secondary | ICD-10-CM | POA: Diagnosis not present

## 2014-01-31 DIAGNOSIS — Z87891 Personal history of nicotine dependence: Secondary | ICD-10-CM

## 2014-01-31 DIAGNOSIS — O99214 Obesity complicating childbirth: Secondary | ICD-10-CM

## 2014-01-31 DIAGNOSIS — O34219 Maternal care for unspecified type scar from previous cesarean delivery: Principal | ICD-10-CM | POA: Diagnosis present

## 2014-01-31 DIAGNOSIS — E669 Obesity, unspecified: Secondary | ICD-10-CM | POA: Diagnosis present

## 2014-01-31 DIAGNOSIS — Z9889 Other specified postprocedural states: Secondary | ICD-10-CM

## 2014-01-31 LAB — SURGICAL PCR SCREEN
MRSA, PCR: NEGATIVE
STAPHYLOCOCCUS AUREUS: POSITIVE — AB

## 2014-01-31 SURGERY — Surgical Case
Anesthesia: Spinal | Site: Abdomen

## 2014-01-31 MED ORDER — OXYCODONE-ACETAMINOPHEN 5-325 MG PO TABS
1.0000 | ORAL_TABLET | ORAL | Status: DC | PRN
  Administered 2014-02-01 – 2014-02-02 (×5): 1 via ORAL
  Filled 2014-01-31 (×5): qty 1

## 2014-01-31 MED ORDER — IBUPROFEN 600 MG PO TABS: 600.0000 mg | ORAL_TABLET | Freq: Four times a day (QID) | ORAL | Status: DC | PRN

## 2014-01-31 MED ORDER — LACTATED RINGERS IV SOLN
Freq: Once | INTRAVENOUS | Status: AC
  Administered 2014-01-31: 09:00:00 via INTRAVENOUS

## 2014-01-31 MED ORDER — MORPHINE SULFATE 0.5 MG/ML IJ SOLN
INTRAMUSCULAR | Status: AC
  Filled 2014-01-31: qty 10

## 2014-01-31 MED ORDER — ONDANSETRON HCL 4 MG/2ML IJ SOLN
INTRAMUSCULAR | Status: AC
  Filled 2014-01-31: qty 2

## 2014-01-31 MED ORDER — LACTATED RINGERS IV SOLN
40.0000 [IU] | INTRAVENOUS | Status: DC | PRN
  Administered 2014-01-31: 40 [IU] via INTRAVENOUS

## 2014-01-31 MED ORDER — DIPHENHYDRAMINE HCL 25 MG PO CAPS: 25.0000 mg | ORAL_CAPSULE | Freq: Four times a day (QID) | ORAL | Status: DC | PRN

## 2014-01-31 MED ORDER — SIMETHICONE 80 MG PO CHEW: 80.0000 mg | CHEWABLE_TABLET | ORAL | Status: DC | PRN

## 2014-01-31 MED ORDER — FENTANYL CITRATE 0.05 MG/ML IJ SOLN
INTRAMUSCULAR | Status: DC | PRN
  Administered 2014-01-31: 12.5 ug via INTRATHECAL

## 2014-01-31 MED ORDER — DIPHENHYDRAMINE HCL 50 MG/ML IJ SOLN: 12.5000 mg | INTRAMUSCULAR | Status: DC | PRN

## 2014-01-31 MED ORDER — ACETAMINOPHEN 500 MG PO TABS
1000.0000 mg | ORAL_TABLET | Freq: Four times a day (QID) | ORAL | Status: AC
  Administered 2014-01-31 – 2014-02-01 (×3): 1000 mg via ORAL
  Filled 2014-01-31 (×4): qty 2

## 2014-01-31 MED ORDER — DIPHENHYDRAMINE HCL 25 MG PO CAPS: 25.0000 mg | ORAL_CAPSULE | ORAL | Status: DC | PRN

## 2014-01-31 MED ORDER — WITCH HAZEL-GLYCERIN EX PADS: 1.0000 "application " | MEDICATED_PAD | CUTANEOUS | Status: DC | PRN

## 2014-01-31 MED ORDER — OXYTOCIN 40 UNITS IN LACTATED RINGERS INFUSION - SIMPLE MED: 62.5000 mL/h | INTRAVENOUS | Status: AC

## 2014-01-31 MED ORDER — INFLUENZA VAC SPLIT QUAD 0.5 ML IM SUSY
0.5000 mL | PREFILLED_SYRINGE | INTRAMUSCULAR | Status: AC
  Administered 2014-02-02: 0.5 mL via INTRAMUSCULAR

## 2014-01-31 MED ORDER — MEPERIDINE HCL 25 MG/ML IJ SOLN: 6.2500 mg | INTRAMUSCULAR | Status: DC | PRN

## 2014-01-31 MED ORDER — NALBUPHINE HCL 10 MG/ML IJ SOLN: 5.0000 mg | INTRAMUSCULAR | Status: DC | PRN

## 2014-01-31 MED ORDER — SIMETHICONE 80 MG PO CHEW
80.0000 mg | CHEWABLE_TABLET | ORAL | Status: DC
  Administered 2014-02-01: 80 mg via ORAL
  Filled 2014-01-31 (×2): qty 1

## 2014-01-31 MED ORDER — SIMETHICONE 80 MG PO CHEW
80.0000 mg | CHEWABLE_TABLET | Freq: Three times a day (TID) | ORAL | Status: DC
  Administered 2014-01-31 – 2014-02-02 (×6): 80 mg via ORAL
  Filled 2014-01-31 (×6): qty 1

## 2014-01-31 MED ORDER — ONDANSETRON HCL 4 MG PO TABS: 4.0000 mg | ORAL_TABLET | ORAL | Status: DC | PRN

## 2014-01-31 MED ORDER — NALOXONE HCL 1 MG/ML IJ SOLN
1.0000 ug/kg/h | INTRAVENOUS | Status: DC | PRN
  Filled 2014-01-31: qty 2

## 2014-01-31 MED ORDER — SCOPOLAMINE 1 MG/3DAYS TD PT72: 1.0000 | MEDICATED_PATCH | Freq: Once | TRANSDERMAL | Status: DC

## 2014-01-31 MED ORDER — KETOROLAC TROMETHAMINE 30 MG/ML IJ SOLN
30.0000 mg | Freq: Four times a day (QID) | INTRAMUSCULAR | Status: DC | PRN
  Administered 2014-01-31: 30 mg via INTRAVENOUS

## 2014-01-31 MED ORDER — MENTHOL 3 MG MT LOZG: 1.0000 | LOZENGE | OROMUCOSAL | Status: DC | PRN

## 2014-01-31 MED ORDER — IBUPROFEN 600 MG PO TABS
600.0000 mg | ORAL_TABLET | Freq: Four times a day (QID) | ORAL | Status: DC
  Administered 2014-01-31 – 2014-02-02 (×8): 600 mg via ORAL
  Filled 2014-01-31 (×8): qty 1

## 2014-01-31 MED ORDER — FENTANYL CITRATE 0.05 MG/ML IJ SOLN: 25.0000 ug | INTRAMUSCULAR | Status: DC | PRN

## 2014-01-31 MED ORDER — LACTATED RINGERS IV SOLN
INTRAVENOUS | Status: DC | PRN
  Administered 2014-01-31 (×2): via INTRAVENOUS

## 2014-01-31 MED ORDER — TETANUS-DIPHTH-ACELL PERTUSSIS 5-2.5-18.5 LF-MCG/0.5 IM SUSP: 0.5000 mL | Freq: Once | INTRAMUSCULAR | Status: DC

## 2014-01-31 MED ORDER — CHLORHEXIDINE GLUCONATE CLOTH 2 % EX PADS
6.0000 | MEDICATED_PAD | Freq: Every day | CUTANEOUS | Status: DC
  Administered 2014-02-01 – 2014-02-02 (×2): 6 via TOPICAL

## 2014-01-31 MED ORDER — SCOPOLAMINE 1 MG/3DAYS TD PT72
1.0000 | MEDICATED_PATCH | Freq: Once | TRANSDERMAL | Status: DC
  Administered 2014-01-31: 1.5 mg via TRANSDERMAL

## 2014-01-31 MED ORDER — MORPHINE SULFATE (PF) 0.5 MG/ML IJ SOLN
INTRAMUSCULAR | Status: DC | PRN
  Administered 2014-01-31: .2 mg via INTRATHECAL

## 2014-01-31 MED ORDER — OXYCODONE-ACETAMINOPHEN 5-325 MG PO TABS: 2.0000 | ORAL_TABLET | ORAL | Status: DC | PRN

## 2014-01-31 MED ORDER — FENTANYL CITRATE 0.05 MG/ML IJ SOLN
INTRAMUSCULAR | Status: AC
  Filled 2014-01-31: qty 2

## 2014-01-31 MED ORDER — ONDANSETRON HCL 4 MG/2ML IJ SOLN: 4.0000 mg | Freq: Three times a day (TID) | INTRAMUSCULAR | Status: DC | PRN

## 2014-01-31 MED ORDER — DIPHENHYDRAMINE HCL 50 MG/ML IJ SOLN: 25.0000 mg | INTRAMUSCULAR | Status: DC | PRN

## 2014-01-31 MED ORDER — SENNOSIDES-DOCUSATE SODIUM 8.6-50 MG PO TABS
2.0000 | ORAL_TABLET | ORAL | Status: DC
  Administered 2014-02-01 (×2): 2 via ORAL
  Filled 2014-01-31 (×2): qty 2

## 2014-01-31 MED ORDER — METOCLOPRAMIDE HCL 5 MG/ML IJ SOLN: 10.0000 mg | Freq: Three times a day (TID) | INTRAMUSCULAR | Status: DC | PRN

## 2014-01-31 MED ORDER — SCOPOLAMINE 1 MG/3DAYS TD PT72
MEDICATED_PATCH | TRANSDERMAL | Status: AC
  Administered 2014-01-31: 1.5 mg via TRANSDERMAL
  Filled 2014-01-31: qty 1

## 2014-01-31 MED ORDER — PHENYLEPHRINE 8 MG IN D5W 100 ML (0.08MG/ML) PREMIX OPTIME
INJECTION | INTRAVENOUS | Status: DC | PRN
  Administered 2014-01-31: 60 ug/min via INTRAVENOUS

## 2014-01-31 MED ORDER — OXYTOCIN 10 UNIT/ML IJ SOLN
INTRAMUSCULAR | Status: AC
  Filled 2014-01-31: qty 4

## 2014-01-31 MED ORDER — NALOXONE HCL 0.4 MG/ML IJ SOLN: 0.4000 mg | INTRAMUSCULAR | Status: DC | PRN

## 2014-01-31 MED ORDER — MUPIROCIN 2 % EX OINT
1.0000 "application " | TOPICAL_OINTMENT | Freq: Two times a day (BID) | CUTANEOUS | Status: DC
  Administered 2014-01-31 – 2014-02-02 (×3): 1 via NASAL
  Filled 2014-01-31 (×3): qty 22

## 2014-01-31 MED ORDER — PRENATAL MULTIVITAMIN CH
1.0000 | ORAL_TABLET | Freq: Every day | ORAL | Status: DC
  Administered 2014-02-01 – 2014-02-02 (×2): 1 via ORAL
  Filled 2014-01-31 (×2): qty 1

## 2014-01-31 MED ORDER — ZOLPIDEM TARTRATE 5 MG PO TABS: 5.0000 mg | ORAL_TABLET | Freq: Every evening | ORAL | Status: DC | PRN

## 2014-01-31 MED ORDER — KETOROLAC TROMETHAMINE 30 MG/ML IJ SOLN: 30.0000 mg | Freq: Four times a day (QID) | INTRAMUSCULAR | Status: DC | PRN

## 2014-01-31 MED ORDER — ONDANSETRON HCL 4 MG/2ML IJ SOLN
INTRAMUSCULAR | Status: DC | PRN
  Administered 2014-01-31: 4 mg via INTRAVENOUS

## 2014-01-31 MED ORDER — BUPIVACAINE IN DEXTROSE 0.75-8.25 % IT SOLN
INTRATHECAL | Status: DC | PRN
  Administered 2014-01-31: 1.4 mL via INTRATHECAL

## 2014-01-31 MED ORDER — PHENYLEPHRINE 8 MG IN D5W 100 ML (0.08MG/ML) PREMIX OPTIME
INJECTION | INTRAVENOUS | Status: AC
  Filled 2014-01-31: qty 100

## 2014-01-31 MED ORDER — DIBUCAINE 1 % RE OINT: 1.0000 "application " | TOPICAL_OINTMENT | RECTAL | Status: DC | PRN

## 2014-01-31 MED ORDER — SODIUM CHLORIDE 0.9 % IJ SOLN: 3.0000 mL | INTRAMUSCULAR | Status: DC | PRN

## 2014-01-31 MED ORDER — FERROUS SULFATE 325 (65 FE) MG PO TABS
325.0000 mg | ORAL_TABLET | Freq: Two times a day (BID) | ORAL | Status: DC
  Administered 2014-02-01 – 2014-02-02 (×3): 325 mg via ORAL
  Filled 2014-01-31 (×4): qty 1

## 2014-01-31 MED ORDER — ONDANSETRON HCL 4 MG/2ML IJ SOLN: 4.0000 mg | INTRAMUSCULAR | Status: DC | PRN

## 2014-01-31 MED ORDER — MIDAZOLAM HCL 2 MG/2ML IJ SOLN: 0.5000 mg | Freq: Once | INTRAMUSCULAR | Status: DC | PRN

## 2014-01-31 MED ORDER — LACTATED RINGERS IV SOLN
INTRAVENOUS | Status: DC
  Administered 2014-01-31: 20:00:00 via INTRAVENOUS

## 2014-01-31 MED ORDER — BUPIVACAINE HCL (PF) 0.25 % IJ SOLN
INTRAMUSCULAR | Status: AC
  Filled 2014-01-31: qty 30

## 2014-01-31 MED ORDER — KETOROLAC TROMETHAMINE 30 MG/ML IJ SOLN
INTRAMUSCULAR | Status: AC
  Filled 2014-01-31: qty 1

## 2014-01-31 MED ORDER — LANOLIN HYDROUS EX OINT: 1.0000 "application " | TOPICAL_OINTMENT | CUTANEOUS | Status: DC | PRN

## 2014-01-31 MED ORDER — PROMETHAZINE HCL 25 MG/ML IJ SOLN
6.2500 mg | INTRAMUSCULAR | Status: DC | PRN
Start: 2014-01-31 — End: 2014-01-31

## 2014-01-31 MED ORDER — BUPIVACAINE HCL (PF) 0.25 % IJ SOLN
INTRAMUSCULAR | Status: DC | PRN
  Administered 2014-01-31: 10 mL

## 2014-01-31 MED ORDER — MAGNESIUM HYDROXIDE 400 MG/5ML PO SUSP: 30.0000 mL | ORAL | Status: DC | PRN

## 2014-01-31 SURGICAL SUPPLY — 46 items
ADH SKN CLS APL DERMABOND .7 (GAUZE/BANDAGES/DRESSINGS) ×1
ADH SKN CLS LQ APL DERMABOND (GAUZE/BANDAGES/DRESSINGS)
BLADE SURG 10 STRL SS (BLADE) ×6 IMPLANT
CLAMP CORD UMBIL (MISCELLANEOUS) IMPLANT
CLOTH BEACON ORANGE TIMEOUT ST (SAFETY) ×3 IMPLANT
CONTAINER PREFILL 10% NBF 15ML (MISCELLANEOUS) IMPLANT
DERMABOND ADHESIVE PROPEN (GAUZE/BANDAGES/DRESSINGS)
DERMABOND ADVANCED (GAUZE/BANDAGES/DRESSINGS) ×2
DERMABOND ADVANCED .7 DNX12 (GAUZE/BANDAGES/DRESSINGS) IMPLANT
DERMABOND ADVANCED .7 DNX6 (GAUZE/BANDAGES/DRESSINGS) IMPLANT
DRAPE LG THREE QUARTER DISP (DRAPES) IMPLANT
DRSG OPSITE POSTOP 4X10 (GAUZE/BANDAGES/DRESSINGS) ×3 IMPLANT
DURAPREP 26ML APPLICATOR (WOUND CARE) ×3 IMPLANT
ELECT REM PT RETURN 9FT ADLT (ELECTROSURGICAL) ×3
ELECTRODE REM PT RTRN 9FT ADLT (ELECTROSURGICAL) ×1 IMPLANT
EXTRACTOR VACUUM M CUP 4 TUBE (SUCTIONS) IMPLANT
EXTRACTOR VACUUM M CUP 4' TUBE (SUCTIONS)
GLOVE BIO SURGEON STRL SZ 6.5 (GLOVE) ×2 IMPLANT
GLOVE BIO SURGEONS STRL SZ 6.5 (GLOVE) ×1
GLOVE BIOGEL PI IND STRL 7.0 (GLOVE) ×1 IMPLANT
GLOVE BIOGEL PI INDICATOR 7.0 (GLOVE) ×2
GOWN STRL REUS W/TWL LRG LVL3 (GOWN DISPOSABLE) ×6 IMPLANT
KIT ABG SYR 3ML LUER SLIP (SYRINGE) IMPLANT
NDL HYPO 25X5/8 SAFETYGLIDE (NEEDLE) IMPLANT
NEEDLE HYPO 22GX1.5 SAFETY (NEEDLE) ×3 IMPLANT
NEEDLE HYPO 25X5/8 SAFETYGLIDE (NEEDLE) IMPLANT
PACK C SECTION WH (CUSTOM PROCEDURE TRAY) ×3 IMPLANT
PAD OB MATERNITY 4.3X12.25 (PERSONAL CARE ITEMS) ×3 IMPLANT
RTRCTR C-SECT PINK 25CM LRG (MISCELLANEOUS) ×3 IMPLANT
STAPLER VISISTAT 35W (STAPLE) IMPLANT
SUT PLAIN 0 NONE (SUTURE) IMPLANT
SUT PLAIN 2 0 XLH (SUTURE) ×2 IMPLANT
SUT VIC AB 0 CT1 27 (SUTURE) ×6
SUT VIC AB 0 CT1 27XBRD ANBCTR (SUTURE) ×2 IMPLANT
SUT VIC AB 0 CTX 36 (SUTURE) ×6
SUT VIC AB 0 CTX36XBRD ANBCTRL (SUTURE) ×2 IMPLANT
SUT VIC AB 2-0 CT1 27 (SUTURE) ×3
SUT VIC AB 2-0 CT1 TAPERPNT 27 (SUTURE) ×1 IMPLANT
SUT VIC AB 3-0 SH 27 (SUTURE)
SUT VIC AB 3-0 SH 27X BRD (SUTURE) IMPLANT
SUT VIC AB 4-0 PS2 27 (SUTURE) ×2 IMPLANT
SYRINGE CONTROL L 12CC (SYRINGE) ×3 IMPLANT
SYRINGE CONTROL LL 12CC (SYRINGE) ×1 IMPLANT
TOWEL OR 17X24 6PK STRL BLUE (TOWEL DISPOSABLE) ×3 IMPLANT
TRAY FOLEY CATH 14FR (SET/KITS/TRAYS/PACK) ×3 IMPLANT
WATER STERILE IRR 1000ML POUR (IV SOLUTION) IMPLANT

## 2014-01-31 NOTE — Transfer of Care (Signed)
Immediate Anesthesia Transfer of Care Note  Patient: Tanya Sanders  Procedure(s) Performed: Procedure(s) with comments: Repeat CESAREAN SECTION (N/A) - EDD: 02/06/14  Patient Location: PACU  Anesthesia Type:Spinal  Level of Consciousness: awake, alert  and oriented  Airway & Oxygen Therapy: Patient Spontanous Breathing  Post-op Assessment: Report given to PACU RN and Post -op Vital signs reviewed and stable  Post vital signs: Reviewed and stable  Complications: No apparent anesthesia complications

## 2014-01-31 NOTE — Anesthesia Preprocedure Evaluation (Signed)
Anesthesia Evaluation  Patient identified by MRN, date of birth, ID band Patient awake    Reviewed: Allergy & Precautions, H&P , NPO status , Patient's Chart, lab work & pertinent test results  Airway Mallampati: II      Dental   Pulmonary former smoker,  breath sounds clear to auscultation        Cardiovascular Exercise Tolerance: Good Rhythm:regular Rate:Normal     Neuro/Psych    GI/Hepatic   Endo/Other  Morbid obesity  Renal/GU      Musculoskeletal   Abdominal   Peds  Hematology   Anesthesia Other Findings   Reproductive/Obstetrics (+) Pregnancy                           Anesthesia Physical Anesthesia Plan  ASA: III  Anesthesia Plan: Spinal   Post-op Pain Management:    Induction:   Airway Management Planned:   Additional Equipment:   Intra-op Plan:   Post-operative Plan:   Informed Consent: I have reviewed the patients History and Physical, chart, labs and discussed the procedure including the risks, benefits and alternatives for the proposed anesthesia with the patient or authorized representative who has indicated his/her understanding and acceptance.     Plan Discussed with: Anesthesiologist, CRNA and Surgeon  Anesthesia Plan Comments:         Anesthesia Quick Evaluation

## 2014-01-31 NOTE — Anesthesia Postprocedure Evaluation (Signed)
  Anesthesia Post-op Note  Patient: Tanya Sanders  Procedure(s) Performed: Procedure(s) with comments: Repeat CESAREAN SECTION (N/A) - EDD: 02/06/14  Patient Location: Mother/Baby  Anesthesia Type:Spinal  Level of Consciousness: awake, alert , oriented and patient cooperative  Airway and Oxygen Therapy: Patient Spontanous Breathing  Post-op Pain: mild  Post-op Assessment: Post-op Vital signs reviewed, Patient's Cardiovascular Status Stable, Respiratory Function Stable, Patent Airway, No signs of Nausea or vomiting, Adequate PO intake, Pain level controlled, No headache and No backache  Post-op Vital Signs: Reviewed and stable  Last Vitals:  Filed Vitals:   01/31/14 1615  BP: 108/52  Pulse: 78  Temp: 37.1 C  Resp: 18    Complications: No apparent anesthesia complications

## 2014-01-31 NOTE — Lactation Note (Signed)
This note was copied from the chart of Tanya Hina Gupta. Lactation Consultation Note  Patient Name: Tanya Sanders [id number] Date: 01/31/2014 Reason for consult: Initial assessment;Other (Comment) (mom is [employer] employee; questions about personal pump) This is mom's second child.  She tried breastfeeding her 28 yo but stopped after a few days due to post-op infection and baby unable to latch. Mom states that this newborn is latching well.  LATCH scores=9/10 per RN assessment and RN has documented teaching hand expression.  LC reviewed and encouraged frequent STS and cue feedings. Mom encouraged to feed baby 8-12 times/24 hours and with feeding cues. LC encouraged review of Baby and Me pp 9, 14 and 20-25 for STS and BF information. LC provided [employer] brochure and reviewed [facility] services and list of community and web site resources. Parents informed of procedure for obtaining a medela pump from [employer] tomorrow.     Maternal Data Formula Feeding for Exclusion: No Has patient been taught Hand Expression?: Yes (documented by RN staff) Does the patient have breastfeeding experience prior to this delivery?: Yes  Feeding Feeding Type: Breast Fed Length of feed: 15 min  LATCH Score/Interventions Latch: Grasps breast easily, tongue down, lips flanged, rhythmical sucking.  Audible Swallowing: Spontaneous and intermittent  Type of Nipple: Everted at rest and after stimulation  Comfort (Breast/Nipple): Soft / non-tender     Hold (Positioning): No assistance needed to correctly position infant at breast.  LATCH Score: 10 (previous feeding assessment by RN)  Lactation Tools Discussed/Used   STS, cue feedings, hand expression [employer] employee pump availability  Consult Status Consult Status: Follow-up Date: 02/01/14 Follow-up type: In-patient    Junious Dresser [facility] 01/31/2014, 5:13 PM

## 2014-01-31 NOTE — Anesthesia Procedure Notes (Signed)
Spinal  Patient location during procedure: OR Start time: 01/31/2014 9:34 AM End time: 01/31/2014 9:38 AM Staffing Anesthesiologist: Lyn Hollingshead Performed by: anesthesiologist  Preanesthetic Checklist Completed: patient identified, surgical consent, pre-op evaluation, timeout performed, IV checked, risks and benefits discussed and monitors and equipment checked Spinal Block Patient position: sitting Prep: site prepped and draped and DuraPrep Patient monitoring: heart rate, cardiac monitor, continuous pulse ox and blood pressure Approach: midline Location: L3-4 Injection technique: single-shot Needle Needle type: Pencan  Needle gauge: 24 G Needle length: 9 cm Needle insertion depth: 7 cm Assessment Sensory level: T4

## 2014-01-31 NOTE — H&P (Signed)
Tanya Sanders is a 28 y.o. female G2P1001 39+ wks presenting for Repeat C/S.  HPI/HPP:  FMs present.  No reg UCs.  No AF leak.  No vaginal bleeding.  No PIH Sx.  Normal pregnancy.  Previous C/S for FPD, desires a repeat C/S.  OB History   Grav Para Term Preterm Abortions TAB SAB Ect Mult Living   2 1 1       1      No past medical history on file. Past Surgical History  Procedure Laterality Date  . Cesarean section    . Cholecystectomy N/A 09/22/2012    Procedure: LAPAROSCOPIC CHOLECYSTECTOMY;  Surgeon: Scherry Ran, MD;  Location: [facility];  Service: General;  Laterality: N/A;  . Ercp N/A 10/06/2012    Procedure: ENDOSCOPIC RETROGRADE CHOLANGIOPANCREATOGRAPHY (ERCP);  Surgeon: Daneil Dolin, MD;  Location: [facility];  Service: Gastroenterology;  Laterality: N/A;  . Sphincterotomy N/A 10/06/2012    Procedure: SPHINCTEROTOMY;  Surgeon: Daneil Dolin, MD;  Location: [facility];  Service: Gastroenterology;  Laterality: N/A;  . Removal of stones N/A 10/06/2012    Procedure: REMOVAL OF STONES;  Surgeon: Daneil Dolin, MD;  Location: [facility];  Service: Gastroenterology;  Laterality: N/A;   Family History: family history is not on file. Social History:  reports that she quit smoking about 17 months ago. Her smoking use included Cigarettes. She has a 2 pack-year smoking history. She has never used smokeless tobacco. She reports that she does not drink alcohol or use illicit drugs.  Allergies  Allergen Reactions  . Bactrim [Sulfamethoxazole-Trimethoprim] Rash  . Penicillins Rash  . Sulfa Antibiotics Rash      Blood pressure 123/80, pulse 91, temperature 98.1 F (36.7 C), temperature source Oral, resp. rate 18, height 5\' 6"  (1.676 m), weight 111.131 kg (245 lb), last menstrual period 04/30/2013, SpO2 98.00%. Exam Physical Exam  HPP: There are no active problems to display for this patient.   Prenatal labs: ABO, Rh: --/--/A POS, A POS (09/08 0910) Antibody: NEG (09/08 0910) Rubella:   Immune RPR: NON REAC (09/08 0910)  HBsAg:  NR  HIV:  NR  Genetic testing: AFP1 Neg [place] anato: wnl 1 hr GTT: wnl GBS:  Neg  Assessment/Plan: 39+ wks previous C/S for repeat.  Surgery and risks reviewed.     Byran Bilotti,MARIE-LYNE 01/31/2014, 8:36 AM

## 2014-01-31 NOTE — Addendum Note (Signed)
Addendum created 01/31/14 1648 by Brock Ra, CRNA   Modules edited: Notes Section   Notes Section:  File: [id number]

## 2014-01-31 NOTE — Anesthesia Postprocedure Evaluation (Signed)
Anesthesia Post Note  Patient: Tanya Sanders  Procedure(s) Performed: Procedure(s) (LRB): Repeat CESAREAN SECTION (N/A)  Anesthesia type: Spinal  Patient location: PACU  Post pain: Pain level controlled  Post assessment: Post-op Vital signs reviewed  Last Vitals:  Filed Vitals:   01/31/14 1145  BP: 113/68  Pulse: 72  Temp:   Resp: 20    Post vital signs: Reviewed  Level of consciousness: awake  Complications: No apparent anesthesia complications

## 2014-01-31 NOTE — Op Note (Signed)
Preoperative diagnosis: Intrauterine pregnancy at 39+ weeks                                            Previous C/S  Post operative diagnosis: Same  Anesthesia: Spinal  Anesthesiologist: Dr. Lyn Hollingshead  Procedure: Repeat low transverse cesarean section  Surgeon: Dr. Princess Bruins  Assistant: Julianne Handler   Estimated blood loss: 700 cc  Procedure:  After being informed of the planned procedure and possible complications including bleeding, infection, injury to other organs, informed consent is obtained. The patient is taken to OR #9 and given spinal anesthesia without complication. She is placed in the dorsal decubitus position with the pelvis tilted to the left. She is then prepped and draped in a sterile fashion. A Foley catheter is inserted in her bladder.  After assessing adequate level of anesthesia, we infiltrate the suprapubic area with 20 cc of Marcaine 0.25 and perform a Pfannenstiel incision which is brought down sharply to the fascia. The fascia is entered in a low transverse fashion. Linea alba is dissected. Peritoneum is entered in a midline fashion. An Alexis retractor is easily positioned. Visceral peritoneum is entered in a low transverse fashion allowing [place] to safely retract bladder by developing a bladder flap.  The myometrium is then entered in a low transverse fashion; first with knife and then extended bluntly. Amniotic fluid is clear. We assist the birth of a female  infant in Cephalic presentation. Mouth and nose are suctioned. The baby is delivered. The cord is clamped and sectioned. The baby is given to the neonatologist present in the room.  10 cc of blood is drawn from the umbilical vein.The placenta is allowed to deliver spontaneously. It is complete and the cord has 3 vessels. Uterine revision is negative.  We proceed with closure of the myometrium in 2 layers: First with a running locked suture of 0 Vicryl, then with a Lembert suture of 0 Vicryl  imbricating the first one. Hemostasis is completed with cauterization on peritoneal edges.  Both paracolic gutters are cleaned. Both tubes and ovaries are assessed and normal. We confirm a satisfactory hemostasis.  Retractors and sponges are removed. Under fascia hemostasis is completed with cauterization.  The parietal peritoneum is closed with a running suture of Vicryl 2-0. The fascia is then closed with 2 running sutures of 0 Vicryl meeting midline. The wound is irrigated with warm saline and hemostasis is completed with cauterization.  The adipose tissue is closed with a running suture of Plain 2-0. The skin is closed with a subcuticular suture of 4-0 Vicryl and Dermabon.  Instrument and sponge count is complete x2. Estimated blood loss is 700 cc.  The procedure is well tolerated by the patient who is taken to recovery room in a well and stable condition.  female baby was born at 10:03 am and received an Apgar of 9  at 1 minute and 9 at 5 minutes. Weight was pending.    Specimen: Placenta sent to [facility] Grippe MD 9/10/201511:38 AM

## 2014-02-01 ENCOUNTER — Encounter (HOSPITAL_COMMUNITY): Payer: Self-pay | Admitting: Obstetrics & Gynecology

## 2014-02-01 LAB — CBC
HCT: 31.1 % — ABNORMAL LOW (ref 36.0–46.0)
HEMOGLOBIN: 10.5 g/dL — AB (ref 12.0–15.0)
MCH: 28.7 pg (ref 26.0–34.0)
MCHC: 33.8 g/dL (ref 30.0–36.0)
MCV: 85 fL (ref 78.0–100.0)
Platelets: 224 10*3/uL (ref 150–400)
RBC: 3.66 MIL/uL — ABNORMAL LOW (ref 3.87–5.11)
RDW: 13.2 % (ref 11.5–15.5)
WBC: 10 10*3/uL (ref 4.0–10.5)

## 2014-02-01 LAB — BIRTH TISSUE RECOVERY COLLECTION (PLACENTA DONATION)

## 2014-02-01 LAB — CCBB MATERNAL DONOR DRAW

## 2014-02-01 NOTE — Lactation Note (Signed)
This note was copied from the chart of Tanya Patti Shorb. Lactation Consultation Note Follow up visit at 35 hours of age. MBU RN requested comfort gels and gave to pt.  Mom has erect nipples with bruising and blistering on nipples.  Skin appears intact.  Mom reports latching in football hold and continue to work with baby on opening mouth wide.  Mom is holding baby STS now with out bra or comfort gels, but reports they feel great and will continue to wear them.  Baby had a recent feeding and is asleep now.  Discussed normal cluster feeding and encouraged hand expression pre and post feedings also to rub into nipples to help them heal.  Mom to call for assist as needed.   Patient Name: Tanya Sanders TRVUY'E Date: 02/01/2014 Reason for consult: Follow-up assessment;Breast/nipple pain   Maternal Data    Feeding Feeding Type: Breast Fed Length of feed: 10 min  LATCH Score/Interventions Latch: Repeated attempts needed to sustain latch, nipple held in mouth throughout feeding, stimulation needed to elicit sucking reflex.  Audible Swallowing: A few with stimulation  Type of Nipple: Everted at rest and after stimulation  Comfort (Breast/Nipple): Filling, red/small blisters or bruises, mild/mod discomfort  Problem noted: Cracked, bleeding, blisters, bruises;Mild/Moderate discomfort  Hold (Positioning): No assistance needed to correctly position infant at breast. Intervention(s): Breastfeeding basics reviewed;Support Pillows;Position options;Skin to skin  LATCH Score: 8  Lactation Tools Discussed/Used     Consult Status Consult Status: Follow-up Date: 02/02/14 Follow-up type: In-patient    Justice Britain 02/01/2014, 9:54 PM

## 2014-02-01 NOTE — Progress Notes (Signed)
Patient ID: Tanya Sanders, female   DOB: 1985-10-11, 28 y.o.   MRN: [id number] Subjective: POD# 1 Information for the patient's newborn:  Tanya Sanders, Tanya Sanders [[id number]]  female  / circ done  Reports feeling sore, but well Feeding: breast Patient reports tolerating PO.  Breast symptoms: none Pain controlled with ibuprofen (OTC) and narcotic analgesics including Percocet Denies HA/SOB/C/P/N/V/dizziness. Flatus none. No BM. She reports vaginal bleeding as normal, without clots.  She is ambulating, urinating without difficult.     Objective:   VS:  Filed Vitals:   02/01/14 0050 02/01/14 0545 02/01/14 1000 02/01/14 1755  BP: 106/60 103/58 107/60 130/56  Pulse: 73 65 67 87  Temp: 98.1 F (36.7 C) 98.1 F (36.7 C) 98.6 F (37 C) 97.8 F (36.6 C)  TempSrc: Oral Oral Oral Oral  Resp: 18 16 18 18   Height:      Weight:      SpO2: 98% 97% 99%      Intake/Output Summary (Last 24 hours) at 02/01/14 2126 Last data filed at 02/01/14 0600  Gross per 24 hour  Intake   1275 ml  Output   1250 ml  Net     25 ml        Recent Labs  02/01/14 0655  WBC 10.0  HGB 10.5*  HCT 31.1*  PLT 224     Blood type: --/--/A POS, A POS (09/08 0910)  Rubella:   Immune    Physical Exam:  General: alert, cooperative and no distress CV: Regular rate and rhythm, S1S2 present or without murmur or extra heart sounds Resp: clear Abdomen: soft, nontender, normal bowel sounds Incision: Tegaderm and Honeycomb C/D/I - skin well-approximated with sutures Uterine Fundus: firm, 1 FB below umbilicus, nontender Lochia: minimal Ext: edema 1+ and Homans sign is negative, no sign of DVT      Assessment/Plan: 28 y.o.   POD# 1.  s/p Cesarean Delivery.  Indications: Repeat                Principal Problem: Postpartum care following cesarean delivery (9/10)  Doing well, stable.               Regular diet as tolerated D/C foley per protocol Ambulate Routine post-op care  Tanya Congress,  MSN, CNM 02/01/2014, 12:30 PM

## 2014-02-01 NOTE — Progress Notes (Signed)
Patient ID: Tanya Sanders, female   DOB: Feb 25, 1986, 28 y.o.   MRN: [id number] Patient not in room at time of rounds.  Patient has gone downstairs to lactation office to rent breast pump and should return to room in about 1 hour - per family members. CNM will return to round on patient later.  Laury Deep, M MSN, CNM 02/01/2014 11:39 AM

## 2014-02-01 NOTE — Lactation Note (Signed)
This note was copied from the chart of Tanya Trinity Haun. Lactation Consultation Note  Mother had questions regarding baby being circumcised and not waking to breastfeed. She has tried hand expression and hand pump and is concerned she is only getting drops. Explained to her that this is normal and volume will increase. Encouraged her to unwrap baby and place him STS.  Room full of visitors.   Reviewed cluster feeding and mother's need to rest since he has been so sleepy and will probably be hungry tonight. Encouraged her to call if she needs help with latching.  Patient Name: Tanya Sanders [id number]'Y Date: 02/01/2014 Reason for consult: Follow-up assessment   Maternal Data    Feeding    LATCH Score/Interventions                      Lactation Tools Discussed/Used     Consult Status Consult Status: Follow-up Date: 02/02/14 Follow-up type: In-patient    Vivianne Master [facility] 02/01/2014, 2:27 PM

## 2014-02-02 MED ORDER — OXYCODONE-ACETAMINOPHEN 5-325 MG PO TABS: 1.0000 | ORAL_TABLET | ORAL | Status: DC | PRN

## 2014-02-02 MED ORDER — IBUPROFEN 600 MG PO TABS: 600.0000 mg | ORAL_TABLET | Freq: Four times a day (QID) | ORAL | Status: DC | PRN

## 2014-02-02 NOTE — Discharge Summary (Signed)
POSTOPERATIVE DISCHARGE SUMMARY:  Patient ID: Tanya Sanders MRN: [id number] DOB/AGE: [birth year] 28 y.o.  Admit date: 01/31/2014 Admission Diagnoses: 39.3 weeks / previous CS  Discharge date:  02/02/2014 Discharge Diagnoses: POD 2 s/p cesarean section repeat  Prenatal history: G2P1002   EDC : 02/04/2014, by Last Menstrual Period  Prenatal care at [facility]  Primary provider : Dellis Filbert Prenatal course complicated by previous CS  Prenatal Labs: ABO, Rh: --/--/A POS, A POS (09/08 0910)  Antibody: NEG (09/08 0910) Rubella:    Immune RPR: NON REAC (09/08 0910)  HBsAg:   NEG HIV:   NR GTT : NL  Medical / Surgical History :  Past medical history: History reviewed. No pertinent past medical history.  Past surgical history:  Past Surgical History  Procedure Laterality Date  . Cesarean section    . Cholecystectomy N/A 09/22/2012    Procedure: LAPAROSCOPIC CHOLECYSTECTOMY;  Surgeon: Scherry Ran, MD;  Location: [facility];  Service: General;  Laterality: N/A;  . Ercp N/A 10/06/2012    Procedure: ENDOSCOPIC RETROGRADE CHOLANGIOPANCREATOGRAPHY (ERCP);  Surgeon: Daneil Dolin, MD;  Location: [facility];  Service: Gastroenterology;  Laterality: N/A;  . Sphincterotomy N/A 10/06/2012    Procedure: SPHINCTEROTOMY;  Surgeon: Daneil Dolin, MD;  Location: [facility];  Service: Gastroenterology;  Laterality: N/A;  . Removal of stones N/A 10/06/2012    Procedure: REMOVAL OF STONES;  Surgeon: Daneil Dolin, MD;  Location: [facility];  Service: Gastroenterology;  Laterality: N/A;  . Cesarean section N/A 01/31/2014    Procedure: Repeat CESAREAN SECTION;  Surgeon: Princess Bruins, MD;  Location: [place] [facility];  Service: Obstetrics;  Laterality: N/A;  EDD: 02/06/14    Family History: History reviewed. No pertinent family history.  Social History:  reports that she quit smoking about 17 months ago. Her smoking use included Cigarettes. She has a 2 pack-year smoking history. She has never used  smokeless tobacco. She reports that she does not drink alcohol or use illicit drugs.  Allergies: Bactrim; Penicillins; and Sulfa antibiotics   Current Medications at time of admission:  Prior to Admission medications   Medication Sig Start Date End Date Taking? Authorizing Provider  Prenatal Vit-Fe Fumarate-FA (PRENATAL MULTIVITAMIN) TABS tablet Take 1 tablet by mouth daily at 12 noon.   Yes Historical Provider, MD    Procedures: Cesarean section delivery on 01/31/2014 with delivery of  female newborn by Dr Dellis Filbert   See operative report for further details APGAR (1 MIN): 9   APGAR (5 MINS): 9    Postoperative / postpartum course:  Uncomplicated with discharge on POD2  Discharge Instructions:  Discharged Condition: stable  Activity: pelvic rest and postoperative restrictions x 2   Diet: routine  Medications:    Medication List         ibuprofen 600 MG tablet  Commonly known as:  ADVIL,MOTRIN  Take 1 tablet (600 mg total) by mouth every 6 (six) hours as needed for mild pain.     oxyCODONE-acetaminophen 5-325 MG per tablet  Commonly known as:  PERCOCET/ROXICET  Take 1 tablet by mouth every 4 (four) hours as needed (for pain scale less than 7).     prenatal multivitamin Tabs tablet  Take 1 tablet by mouth daily at 12 noon.        Wound Care: keep clean and dry / remove honeycomb POD 2 Postpartum Instructions: [facility] discharge booklet - instructions reviewed  Discharge to: Home  Follow up :  [facility] in 6 weeks for routine  postpartum visit with Dr Dellis Filbert                Signed: Artelia Laroche CNM, MSN, [facility] 02/02/2014, 11:18 AM

## 2014-02-02 NOTE — Progress Notes (Signed)
Seen and agree - DC home - Artelia Laroche CNM [facility]

## 2014-02-02 NOTE — Progress Notes (Signed)
Patient is asking for an abdominal binder, will ask the dr when they round this am.

## 2014-02-02 NOTE — Lactation Note (Addendum)
This note was copied from the chart of Tanya Nena Hampe. Lactation Consultation Note  Mother called to check latch.  Mother is concerned about her milk supply since he cluster fed last night.  9% weight loss. Explained he was circumcised yesterday and has been making up for hours of not feeding. Reassured her by demonstrating drops of breastmilk with hand expression. Mother placed baby in football hold.  Sleepy at the breast,  Some swallows with stimulation.  Mother unlatched baby and her nipple was compressed lipstick shape and tender. Assessed baby's mouth and he has a posterior tight frenulum which could be reason for nipple soreness and to talk to her Pediatrician if soreness continues. Suggest mother achieve a deeper latch by pushing his shoulder versus his head and neck to bring more chin to the breast. Encouraged mother to massage her breast to keep him active and resist pushing her breast tissue down to make sure he can breathe. Education provided on him being able to breathe while breastfeeding. Mother has comfort gels. Mom encouraged to feed baby 8-12 times/24 hours and with feeding cues. Monitor voids/stools.  His stools are now brown. Suggest Mother post pump 15-20 min 4-6 times a day to boost her milk supply.  Give baby back volume pumped.  Provided instruction on using a foley cup. Mother has DEBP. Encouraged mother to call if she has further questions.  Patient Name: Tanya Sanders [id number]'[id number] Date: 02/02/2014 Reason for consult: Follow-up assessment   Maternal Data    Feeding Feeding Type: Breast Fed Length of feed: 20 min  LATCH Score/Interventions Latch: Grasps breast easily, tongue down, lips flanged, rhythmical sucking.  Audible Swallowing: A few with stimulation Intervention(s): Hand expression;Alternate breast massage;Skin to skin  Type of Nipple: Everted at rest and after stimulation  Comfort (Breast/Nipple): Filling, red/small blisters or bruises,  mild/mod discomfort  Problem noted: Mild/Moderate discomfort Interventions  (Cracked/bleeding/bruising/blister): Expressed breast milk to nipple Interventions (Mild/moderate discomfort): Comfort gels  Hold (Positioning): Assistance needed to correctly position infant at breast and maintain latch.  LATCH Score: 7  Lactation Tools Discussed/Used Tools: Comfort gels   Consult Status Consult Status: PRN Date: 02/03/14 Follow-up type: In-patient    Vivianne Master [facility] 02/02/2014, 11:59 AM

## 2014-02-02 NOTE — Progress Notes (Signed)
POSTOPERATIVE DAY # 2 S/P Cesarean section  of viable baby boy   S:         Reports feeling well             Tolerating po intake / no nausea / no vomiting / positive flatus / no BM             Bleeding is spotting             Incisional Pain controlled with ibuprofen (OTC), prescription NSAID's including ibuprofen (Motrin) and narcotic analgesics including oxycodone/acetaminophen (Percocet, Tylox). Additional  RLQ pain rated 8/10 exacerbated with                  ambulation and palpation. States " I must hold the site in order to walk without pain, feels like my  skin is tearing"             Up ad lib / ambulatory/ voiding QS  Newborn breastfeeding  / Circumcision done             Desires discharge to home today   O:  VS: BP 130/74  Pulse 77  Temp(Src) 98 F (36.7 C) (Oral)  Resp 18  Ht 5\' 6"  (1.676 m)  Wt 111.131 kg (245 lb)  BMI 39.56 kg/m2  SpO2 99%  LMP 04/30/2013  Breastfeeding? Unknown   LABS:                Recent Labs  02/01/14 0655  WBC 10.0  HGB 10.5*  PLT 224               Bloodtype: --/--/A POS, A POS (09/08 0910)  Rubella:   Immune                               Physical Exam:             Alert and Oriented X4  Lungs: Clear and unlabored  Heart: regular rate and rhythm / no mumurs  Abdomen: soft, non-tender, non-distended normoactive bowel sounds. RLQ guarding noted and palpated raised             area             Fundus: firm, non-tender, U3FB below             Dressing honeycomb              Incision:  approximated with sutures / mo erythema / no ecchymosis / no drainage  Extremities: +1 edema, no calf pain or tenderness  A:        POD # 2 S/P cesarean section            Post op pain  P:        Routine postoperative care   Discharge to home  Prescription for percocet              Educated on when to call office, incisional care and expected healing, maintain adequate hydration  Heat pack to RLQ with PRN ibuprofen & percocet      Tanya Sanders  SNM 02/02/2014, 11:03 AM

## 2014-02-02 NOTE — Lactation Note (Signed)
This note was copied from the chart of Tanya Patches Mcdonnell. Lactation Consultation Note  Mother complaining of nipple pain and worried about baby not getting enough breastmilk. Mother states she sees swallows and baby cluster fed last night after sleeping a long period yesterday after circumcism. Baby sleeping in visitors arms. Mother has comfort gels, suggest it would be good to evaluate latch when he wakens. Left lactation phone number and encouraged her to call.  Patient Name: Tanya Sanders LDJTT'S Date: 02/02/2014 Reason for consult: Follow-up assessment   Maternal Data    Feeding Feeding Type: Breast Fed Length of feed: 20 min  LATCH Score/Interventions Latch: Grasps breast easily, tongue down, lips flanged, rhythmical sucking.  Audible Swallowing: A few with stimulation  Type of Nipple: Everted at rest and after stimulation  Comfort (Breast/Nipple): Filling, red/small blisters or bruises, mild/mod discomfort  Problem noted: Mild/Moderate discomfort Interventions (Mild/moderate discomfort): Hand expression  Hold (Positioning): No assistance needed to correctly position infant at breast.  LATCH Score: 8  Lactation Tools Discussed/Used     Consult Status Consult Status: Follow-up Date: 02/03/14 Follow-up type: In-patient    Vivianne Master [facility] 02/02/2014, 11:16 AM

## 2014-02-03 IMAGING — US US ABDOMEN COMPLETE
1 series · 14 of 25 positions shown · non-contrast
Comparison: 09/12/2012

CLINICAL DATA: Abdominal pain, elevated LFTs, cholecystectomy 2
weeks ago

ABDOMINAL ULTRASOUND COMPLETE

[Series 1: us abdomen complete · 0.25mm/px · 14 of 77 slices shown]
[im 1/77]
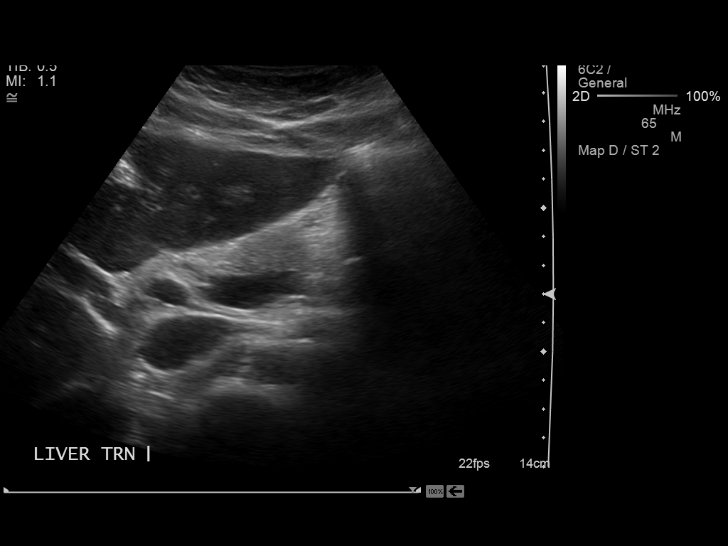
[im 7/77]
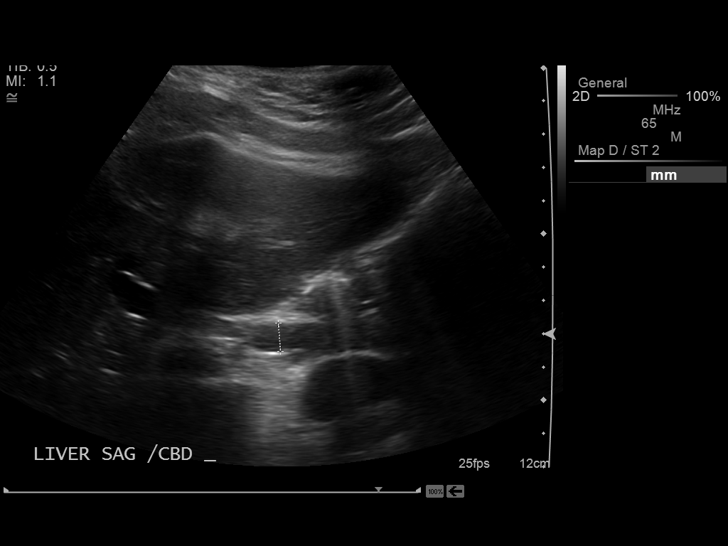
[im 13/77]
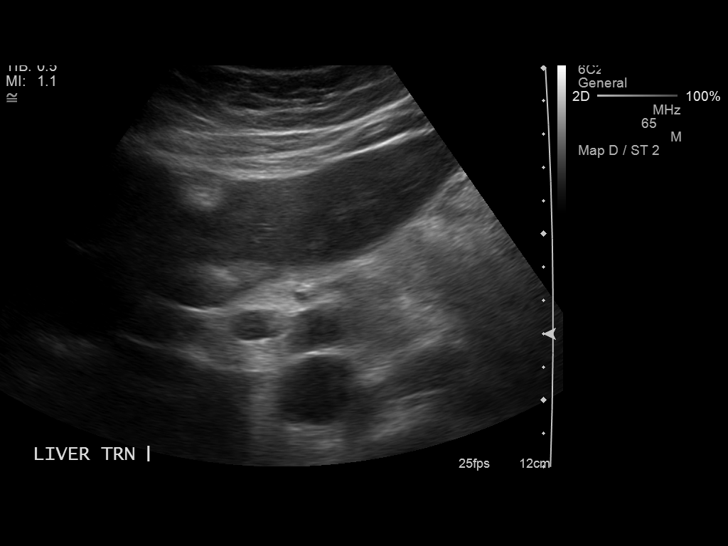
[im 20/77]
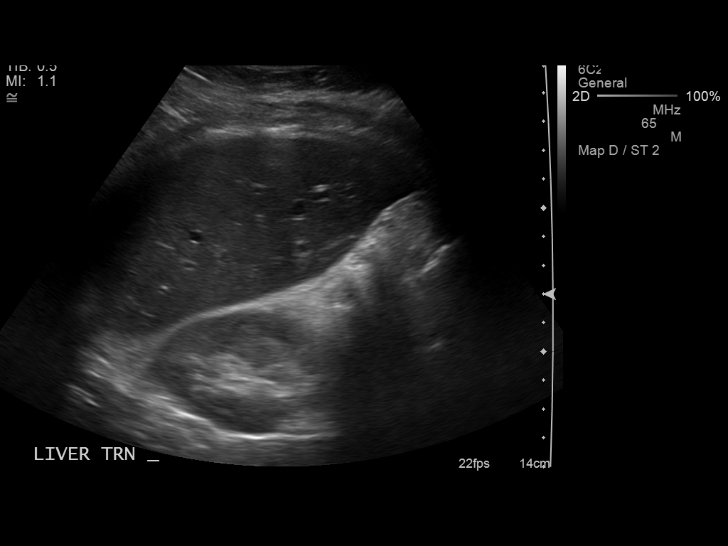
[im 26/77]
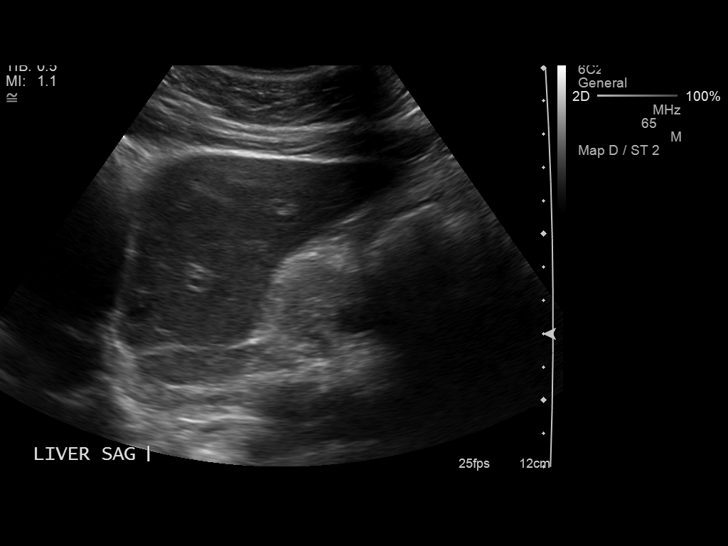
[im 29/77]
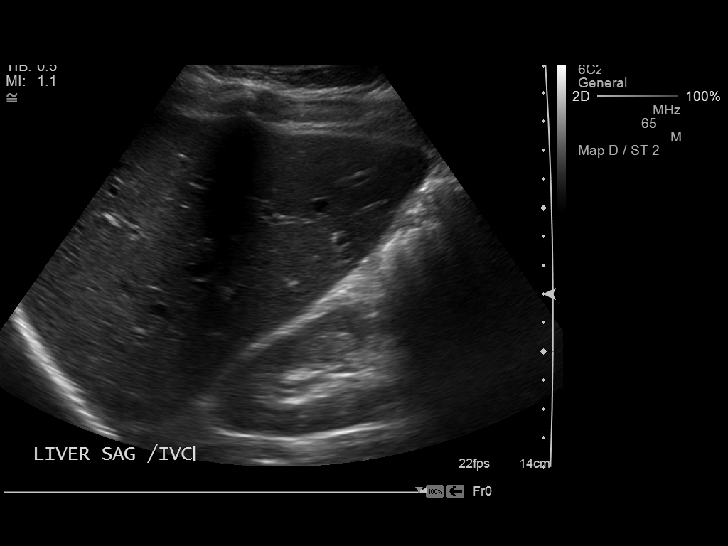
[im 35/77]
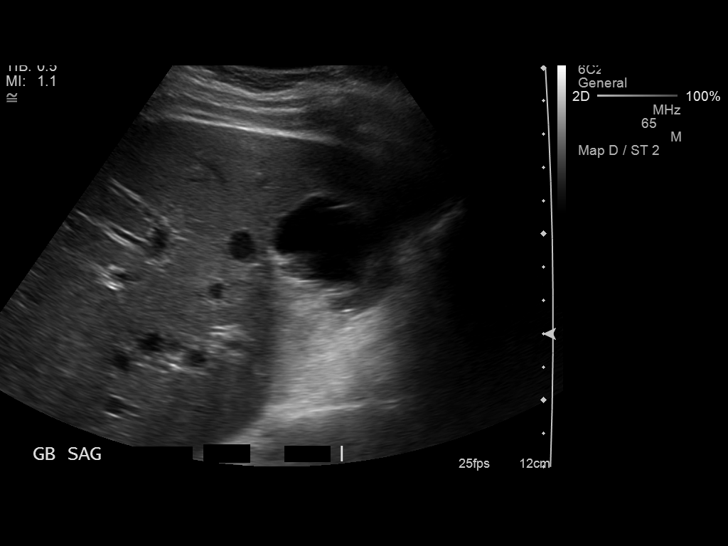
[im 42/77]
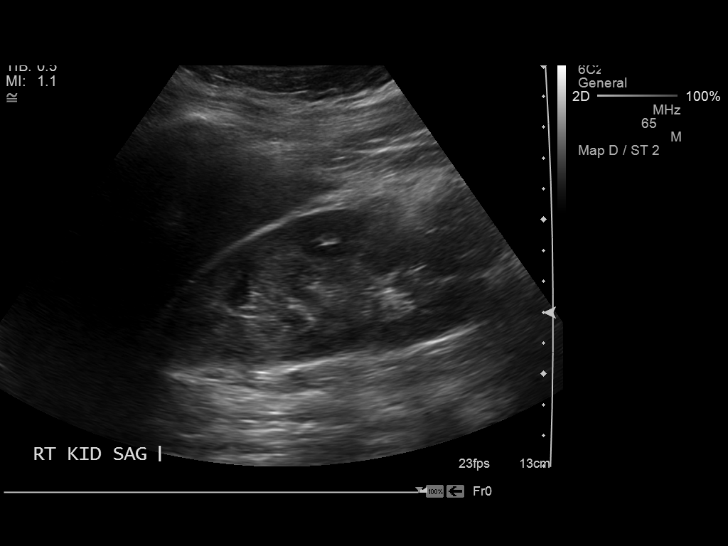
[im 48/77]
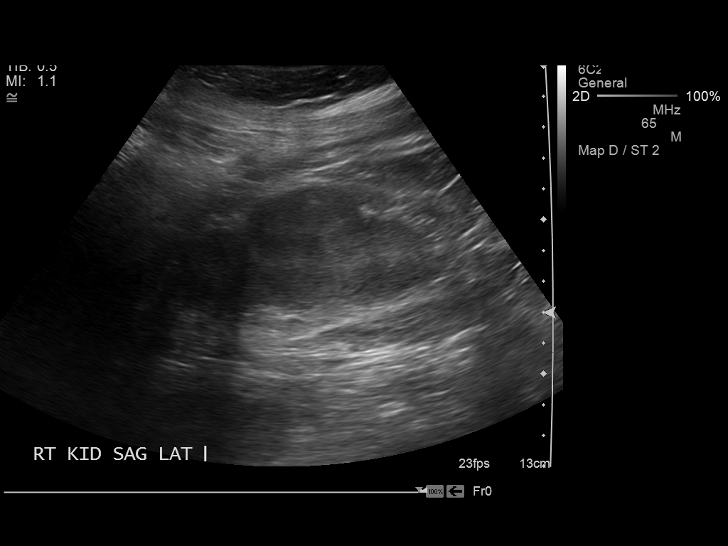
[im 51/77]
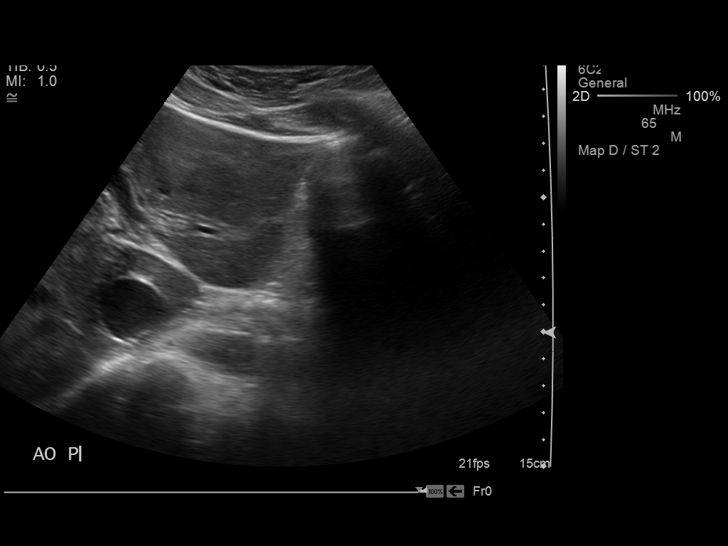
[im 58/77]
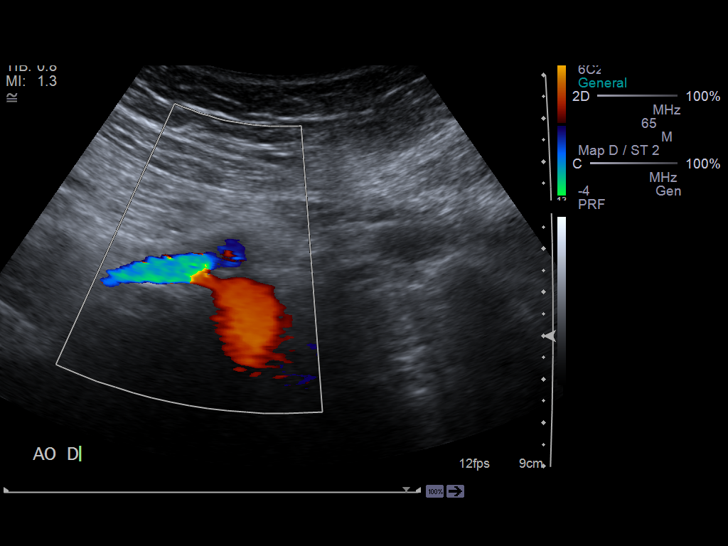
[im 64/77]
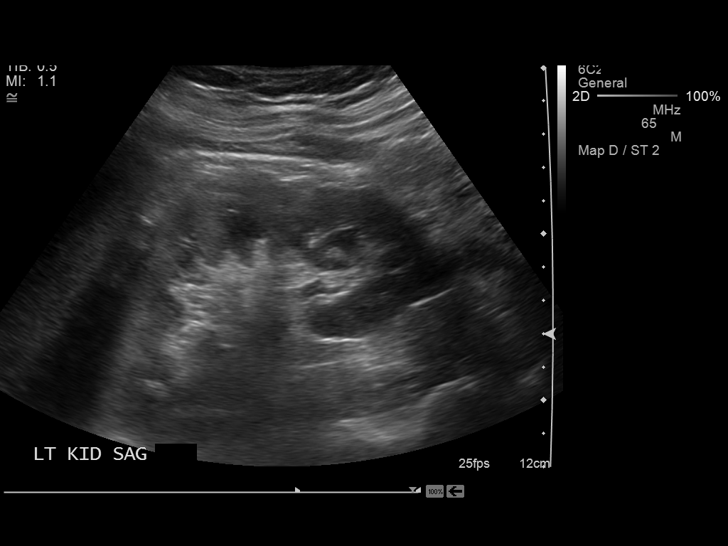
[im 70/77]
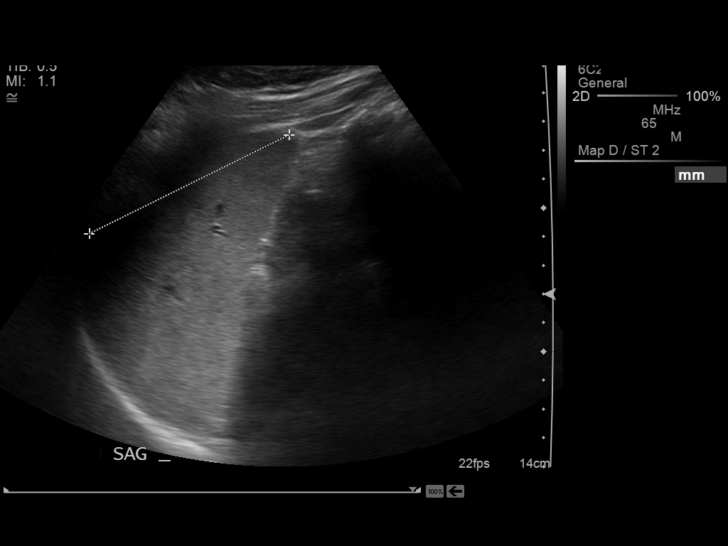
[im 77/77]
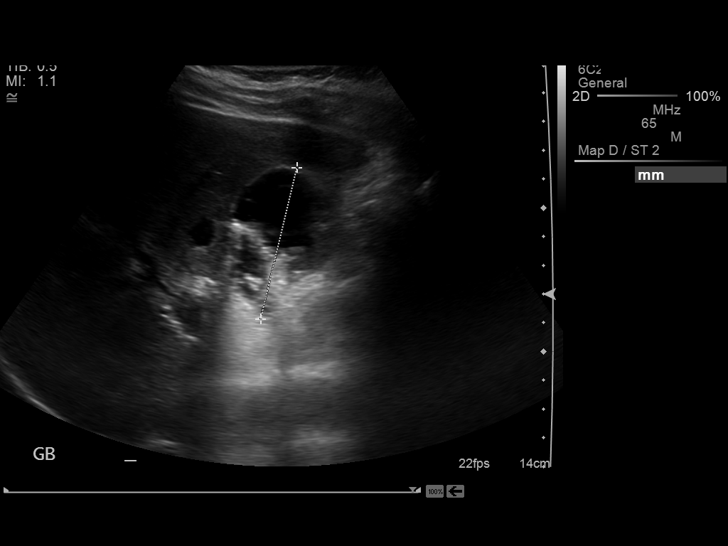

[14 of 25 positions shown; findings below may reference images not displayed]

FINDINGS: Gallbladder:  Within the gallbladder fossa, there is a
heterogeneous complex fluid collection with internal echogenic
debris measuring 6 x 4 cm.  This could represent postoperative
gallbladder fossa abscess, biloma, or hematoma.

Common Bile Duct:  Mildly prominent, measuring 9 mm in diameter.

Liver: No focal mass lesion identified.  Within normal limits in
parenchymal echogenicity.

IVC:  Appears normal.

Pancreas:  No abnormality identified.

Spleen:  Within normal limits in size and echotexture.

Right kidney:  Normal in size and parenchymal echogenicity.  No
evidence of mass or hydronephrosis.

Left kidney:  Normal in size and parenchymal echogenicity.  No
evidence of mass or hydronephrosis.

Abdominal Aorta:  No aneurysm identified.
IMPRESSION: 6 x 4 cm complex gallbladder fossa fluid collection postoperatively
concerning for abscess, biloma, or hematoma.

Mild common bile duct dilatation.

## 2014-02-03 IMAGING — NM NM HEPATOBILIARY IMAGE, INC GB
1 series · 6 of 6 positions shown · non-contrast
Comparison: Ultrasound abdomen 10/06/2012

CLINICAL DATA: Abdominal pain, 2 weeks post cholecystectomy for
cholelithiasis, elevated bilirubin 4.5, question leak or biliary
obstruction, abnormal ultrasound with complex fluid collection at
gallbladder fossa

NUCLEAR MEDICINE HEPATOHBILIARY INCLUDE GB
Radiopharmaceutical:  5 mCi Ic-KKm mebrofenin

[hida · 3.20mm/px · 6 of 60 frames shown]
[frame 6/60]
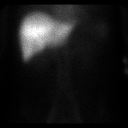
[frame 16/60]
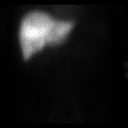
[frame 26/60]
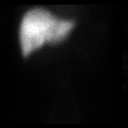
[frame 36/60]
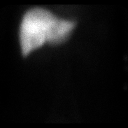
[frame 46/60]
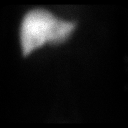
[frame 56/60]
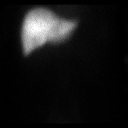

[6 of 6 positions shown; findings below may reference images not displayed]

FINDINGS: Prompt tracer extraction by bloodstream indicating normal
hepatocellular function.
No concentration and excretion of tracer is seen into the biliary
tree by 1 hour.
Additional delayed imaging for 30 minutes demonstrates persistent
tracer retention within the liver without biliary excretion.
Findings are most consistent with a high-grade CBD obstruction.
No gross evidence of a large bile leak that would decompressed the
biliary system is identified, but unable to assess for presence of
small bowel leaks due to failed excretion of tracer.
IMPRESSION: Good uptake of tracer by the liver with failed excretion of tracer
into the biliary tree by 90 minutes consistent with a high-grade
distal CBD obstruction as discussed above.

Findings discussed with Dr. Blanca Karina and Dr. Saf prior to
dictation of this report.

## 2014-02-03 IMAGING — RF DG ERCP WO/W SPHINCTEROTOMY
1 series · 15 of 24 positions shown · non-contrast
Comparison: Ultrasound of 10/06/2012.

CLINICAL DATA: Stones.  Balloon dilatation.

ERCP
TECHNIQUE: Multiple spot images obtained with the fluoroscopic
device and submitted for interpretation post-procedure.  ERCP was
performed by Dr. Kpormegbe.

[Series 1: run · 13 acquisitions, 15 frames shown]
[im 1/13]
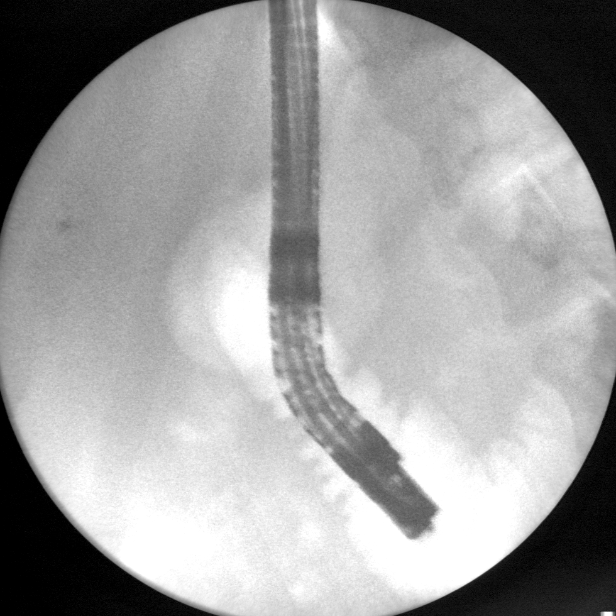
[im 2/13]
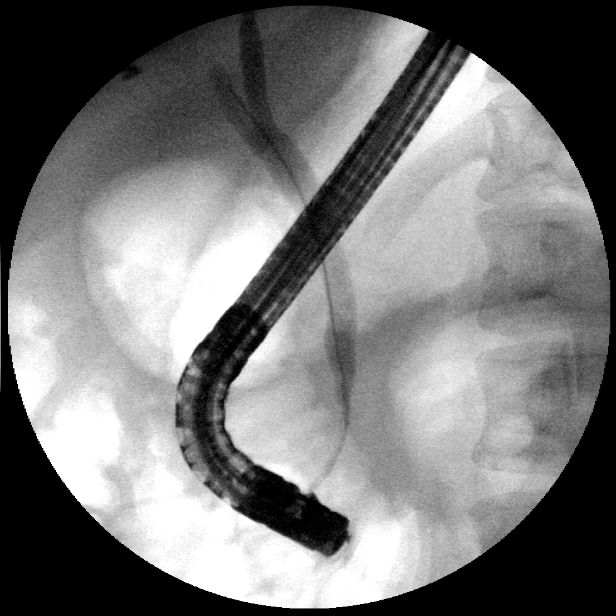
[im 3/13]
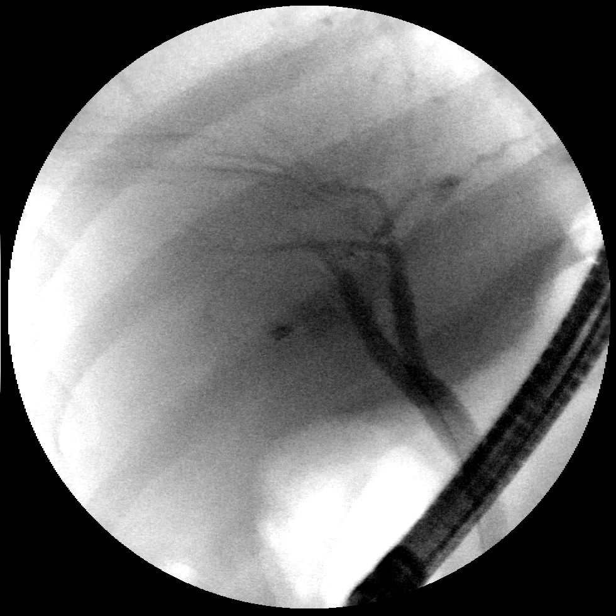
[im 3/13]
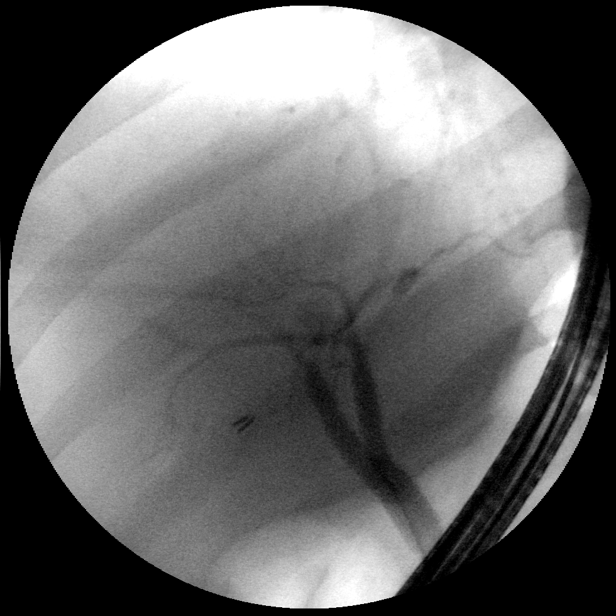
[im 5/13]
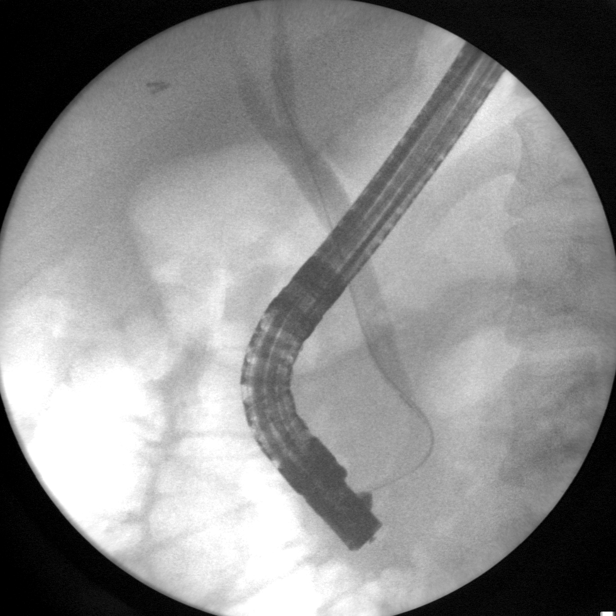
[im 6/13]
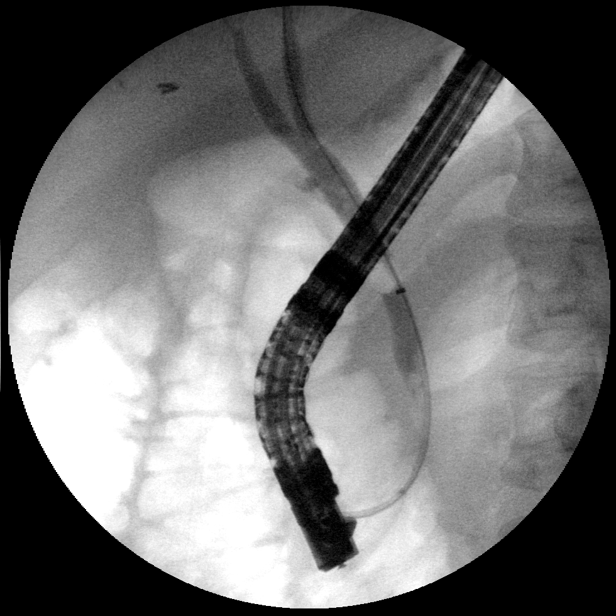
[im 6/13]
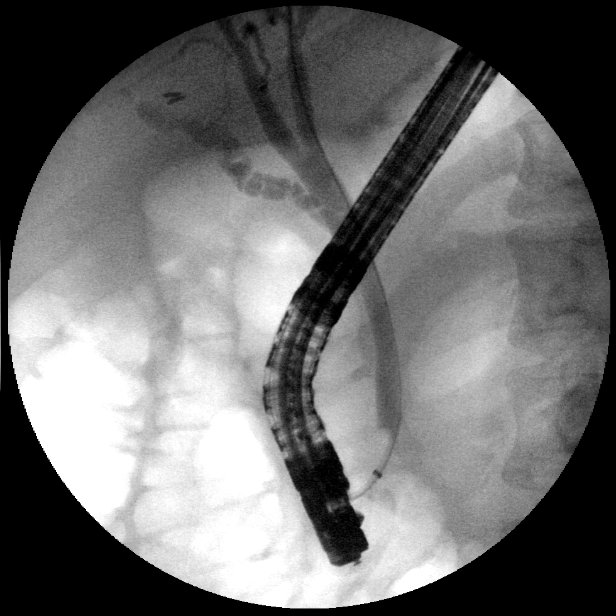
[im 7/13]
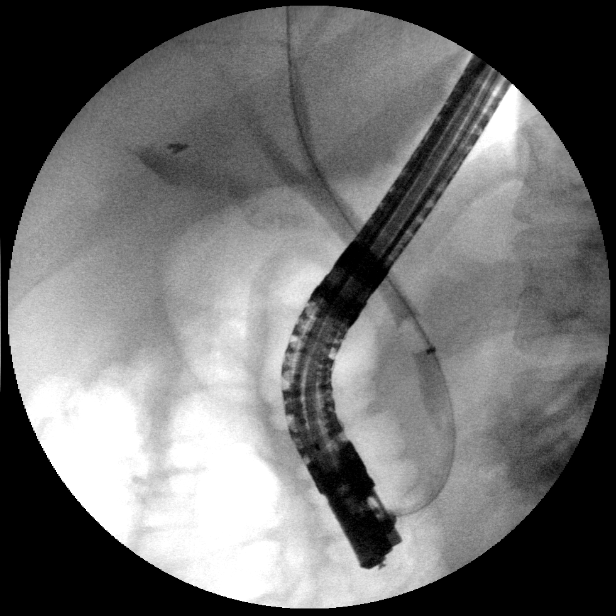
[im 8/13]
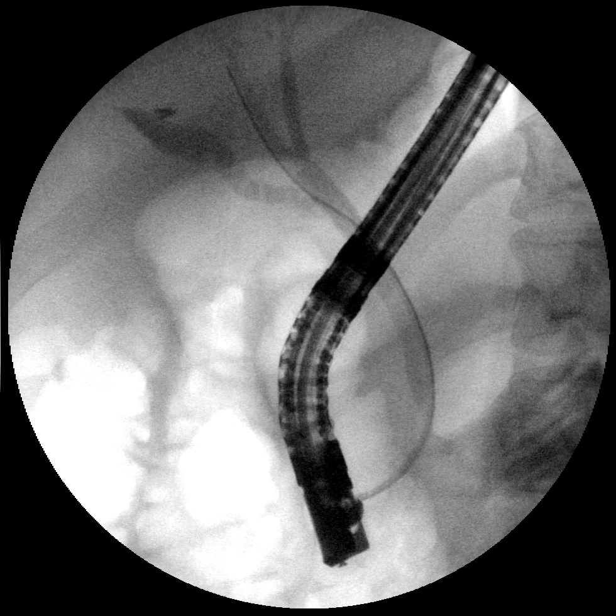
[im 8/13]
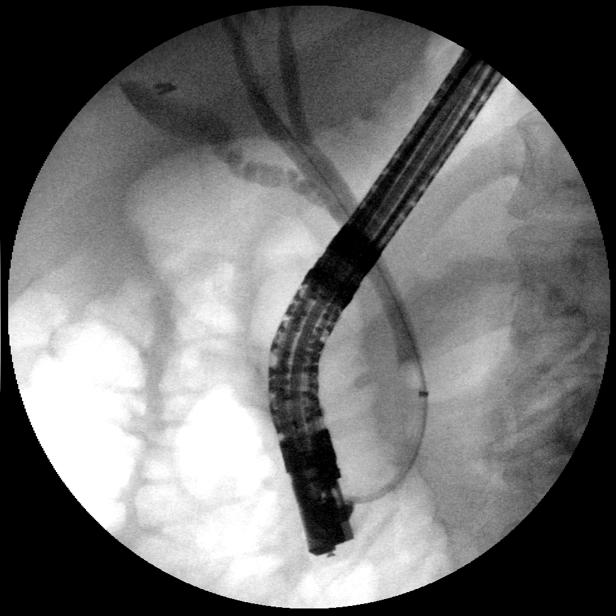
[im 9/13]
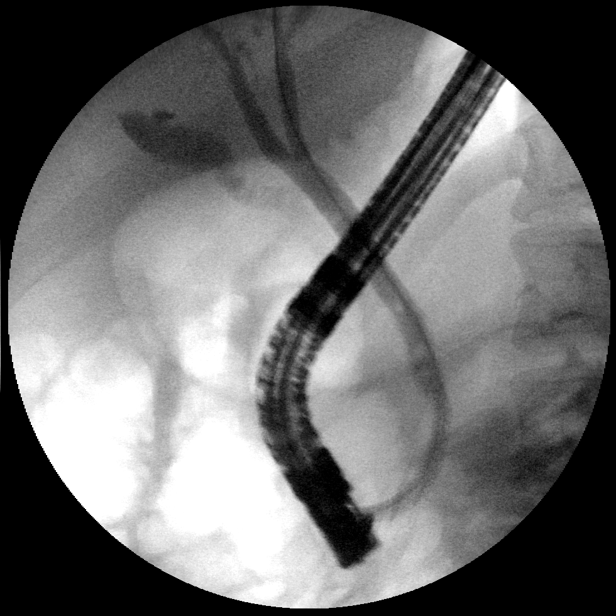
[im 9/13]
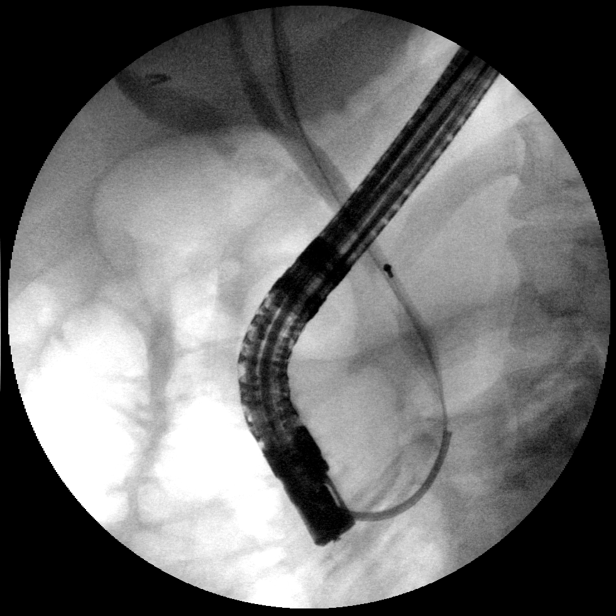
[im 11/13]
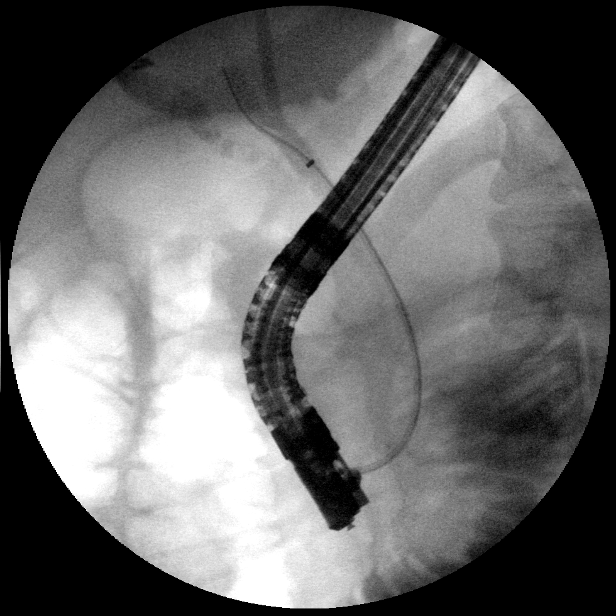
[im 11/13]
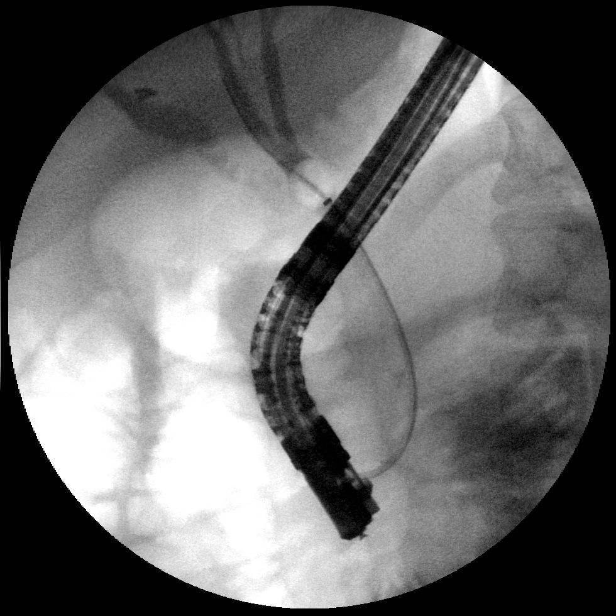
[im 13/13]
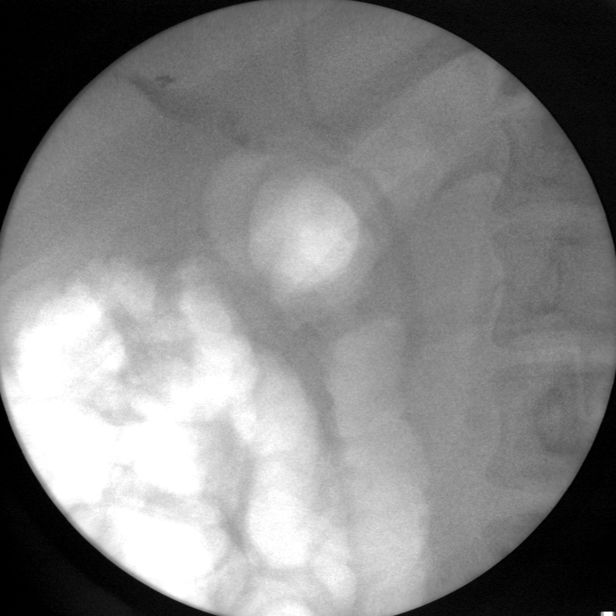

[15 of 24 positions shown; findings below may reference images not displayed]

FINDINGS: Multiple images from ERCP performed by the clinical
service submitted.  These demonstrate contrast injection of common
duct with distal common duct filling defects.  Subsequent balloon
retrieval of reported common duct stones.  After the final wire
removal, subsequent images do not well opacify common duct to
exclude residual stones.
IMPRESSION: ERCP performed by the clinical service with balloon retrieval of
common duct stones.  Please see above.

These images were submitted for radiologic interpretation only.
Please see the procedural report for the amount of contrast and the
fluoroscopy time utilized.

## 2014-03-25 ENCOUNTER — Encounter (HOSPITAL_COMMUNITY): Payer: Self-pay | Admitting: Obstetrics & Gynecology

## 2014-05-18 ENCOUNTER — Emergency Department (HOSPITAL_COMMUNITY)
Admission: EM | Admit: 2014-05-18 | Discharge: 2014-05-18 | Disposition: A | Discharge status: Home / Self Care | Payer: 59 | Source: Home / Self Care | Attending: Family Medicine | Admitting: Family Medicine

## 2014-05-18 ENCOUNTER — Encounter (HOSPITAL_COMMUNITY): Payer: Self-pay | Admitting: *Deleted

## 2014-05-18 DIAGNOSIS — J020 Streptococcal pharyngitis: Secondary | ICD-10-CM

## 2014-05-18 LAB — POCT RAPID STREP A: Streptococcus, Group A Screen (Direct): POSITIVE — AB

## 2014-05-18 MED ORDER — IPRATROPIUM BROMIDE 0.06 % NA SOLN: 2.0000 | Freq: Four times a day (QID) | NASAL | Status: DC

## 2014-05-18 MED ORDER — CLINDAMYCIN HCL 300 MG PO CAPS: 300.0000 mg | ORAL_CAPSULE | Freq: Three times a day (TID) | ORAL | Status: DC

## 2014-05-18 NOTE — ED Provider Notes (Signed)
Tanya Sanders is a 28 y.o. female who presents to Urgent Care today for congestion and right ear pain and sore throat. Symptoms present for week worsening recently. No vomiting diarrhea fevers or chills. She's tried ibuprofen and Tylenol Sudafed which helps. Breathing or cough.   History reviewed. No pertinent past medical history. Past Surgical History  Procedure Laterality Date  . Cesarean section    . Cholecystectomy N/A 09/22/2012    Procedure: LAPAROSCOPIC CHOLECYSTECTOMY;  Surgeon: Scherry Ran, MD;  Location: [facility];  Service: General;  Laterality: N/A;  . Ercp N/A 10/06/2012    Procedure: ENDOSCOPIC RETROGRADE CHOLANGIOPANCREATOGRAPHY (ERCP);  Surgeon: Daneil Dolin, MD;  Location: [facility];  Service: Gastroenterology;  Laterality: N/A;  . Sphincterotomy N/A 10/06/2012    Procedure: SPHINCTEROTOMY;  Surgeon: Daneil Dolin, MD;  Location: [facility];  Service: Gastroenterology;  Laterality: N/A;  . Removal of stones N/A 10/06/2012    Procedure: REMOVAL OF STONES;  Surgeon: Daneil Dolin, MD;  Location: [facility];  Service: Gastroenterology;  Laterality: N/A;  . Cesarean section N/A 01/31/2014    Procedure: Repeat CESAREAN SECTION;  Surgeon: Princess Bruins, MD;  Location: [facility];  Service: Obstetrics;  Laterality: N/A;  EDD: 02/06/14   History  Substance Use Topics  . Smoking status: Former Smoker -- 0.00 packs/day for 4 years    Types: Cigarettes    Quit date: 08/22/2012  . Smokeless tobacco: Never Used  . Alcohol Use: No   ROS as above Medications: No current facility-administered medications for this encounter.   Current Outpatient Prescriptions  Medication Sig Dispense Refill  . ibuprofen (ADVIL,MOTRIN) 600 MG tablet Take 1 tablet (600 mg total) by mouth every 6 (six) hours as needed for mild pain. 30 tablet 0  . UNKNOWN TO PATIENT BCPs    . clindamycin (CLEOCIN) 300 MG capsule Take 1 capsule (300 mg total) by mouth 3 (three) times daily. 30 capsule 0  . ipratropium  (ATROVENT) 0.06 % nasal spray Place 2 sprays into both nostrils 4 (four) times daily. 15 mL 1   Allergies  Allergen Reactions  . Bactrim [Sulfamethoxazole-Trimethoprim] Rash  . Penicillins Rash  . Sulfa Antibiotics Rash     Exam:  BP 137/76 mmHg  Pulse 101  Temp(Src) 98.8 F (37.1 C) (Oral)  Resp 16  SpO2 100%  LMP 04/09/2014 (Exact Date)  Breastfeeding? No Gen: Well NAD HEENT: EOMI,  MMM right tympanic membrane with effusion without erythema. Left is normal. Posterior pharynx is erythematous with exudate and cobblestoning. Clear nasal discharge and inflamed nasal turbinates bilaterally. Lungs: Normal work of breathing. CTABL Heart: RRR no MRG rate 90 bpm per my check Abd: NABS, Soft. Nondistended, Nontender Exts: Brisk capillary refill, warm and well perfused.   Results for orders placed or performed during the hospital encounter of 05/18/14 (from the past 24 hour(s))  POCT rapid strep A [facility])     Status: Abnormal   Collection Time: 05/18/14  9:40 AM  Result Value Ref Range   Streptococcus, Group A Screen (Direct) POSITIVE (A) NEGATIVE   No results found.  Assessment and Plan: 28 y.o. female with strep throat. Treatment with clindamycin as patient is allergic to penicillin. Additionally his Atrovent nasal spray. Continue Tylenol.  Discussed warning signs or symptoms. Please see discharge instructions. Patient expresses understanding.     Gregor Hams, MD 05/18/14 [phone]

## 2014-05-18 NOTE — ED Notes (Signed)
C/O HA, sinus congestion starting 1 wk ago; has been taking Sudafed, Tyl Sinus without any change.  Started with sore throat this week - initially felt it was from postnasal drainage, but became severe last night with swelling and pain.  Taking IBU with some improvement, but feels "gaggy" from tonsils.  Tonsils noted to be enlarged, inflamed.  Denies fevers.  Also c/o right earache.

## 2014-05-18 NOTE — Discharge Instructions (Signed)
Thank you for coming in today. Take clindamycin Use Atrovent nasal spray Continue Sudafed   Pharyngitis Pharyngitis is redness, pain, and swelling (inflammation) of your pharynx.  CAUSES  Pharyngitis is usually caused by infection. Most of the time, these infections are from viruses (viral) and are part of a cold. However, sometimes pharyngitis is caused by bacteria (bacterial). Pharyngitis can also be caused by allergies. Viral pharyngitis may be spread from person to person by coughing, sneezing, and personal items or utensils (cups, forks, spoons, toothbrushes). Bacterial pharyngitis may be spread from person to person by more intimate contact, such as kissing.  SIGNS AND SYMPTOMS  Symptoms of pharyngitis include:   Sore throat.   Tiredness (fatigue).   Low-grade fever.   Headache.  Joint pain and muscle aches.  Skin rashes.  Swollen lymph nodes.  Plaque-like film on throat or tonsils (often seen with bacterial pharyngitis). DIAGNOSIS  Your health care provider will ask you questions about your illness and your symptoms. Your medical history, along with a physical exam, is often all that is needed to diagnose pharyngitis. Sometimes, a rapid strep test is done. Other lab tests may also be done, depending on the suspected cause.  TREATMENT  Viral pharyngitis will usually get better in 3-4 days without the use of medicine. Bacterial pharyngitis is treated with medicines that kill germs (antibiotics).  HOME CARE INSTRUCTIONS   Drink enough water and fluids to keep your urine clear or pale yellow.   Only take over-the-counter or prescription medicines as directed by your health care provider:   If you are prescribed antibiotics, make sure you finish them even if you start to feel better.   Do not take aspirin.   Get lots of rest.   Gargle with 8 oz of salt water ( tsp of salt per 1 qt of water) as often as every 1-2 hours to soothe your throat.   Throat lozenges  (if you are not at risk for choking) or sprays may be used to soothe your throat. SEEK MEDICAL CARE IF:   You have large, tender lumps in your neck.  You have a rash.  You cough up green, yellow-brown, or bloody spit. SEEK IMMEDIATE MEDICAL CARE IF:   Your neck becomes stiff.  You drool or are unable to swallow liquids.  You vomit or are unable to keep medicines or liquids down.  You have severe pain that does not go away with the use of recommended medicines.  You have trouble breathing (not caused by a stuffy nose). MAKE SURE YOU:   Understand these instructions.  Will watch your condition.  Will get help right away if you are not doing well or get worse. Document Released: 05/10/2005 Document Revised: 02/28/2013 Document Reviewed: 01/15/2013 [facility] Patient Information 2015 [place], [place]. This information is not intended to replace advice given to you by your health care provider. Make sure you discuss any questions you have with your health care provider.

## 2014-08-12 ENCOUNTER — Ambulatory Visit (INDEPENDENT_AMBULATORY_CARE_PROVIDER_SITE_OTHER): Payer: 59 | Admitting: Family Medicine

## 2014-08-12 ENCOUNTER — Encounter: Payer: Self-pay | Admitting: Family Medicine

## 2014-08-12 DIAGNOSIS — M25562 Pain in left knee: Secondary | ICD-10-CM

## 2014-08-12 DIAGNOSIS — E282 Polycystic ovarian syndrome: Secondary | ICD-10-CM | POA: Diagnosis not present

## 2014-08-12 DIAGNOSIS — M25561 Pain in right knee: Secondary | ICD-10-CM | POA: Diagnosis not present

## 2014-08-12 MED ORDER — PHENTERMINE HCL 37.5 MG PO CAPS: 37.5000 mg | ORAL_CAPSULE | ORAL | Status: DC

## 2014-08-12 NOTE — Progress Notes (Signed)
   Subjective:    Patient ID: Tanya Sanders, female    DOB: Nov 16, 1985, 29 y.o.   MRN: [id number]  HPI The patient comes in today for a wellness visit.  A review of their health history was completed.  A review of medications was also completed.  Any needed refills: No  Eating habits: Health conscious  Falls/  MVA accidents in past few months: No  Regular exercise: Yes  Specialist pt sees on regular basis: No  Preventative health issues were discussed.   Additional concerns: Wants to discuss weight loss  Fa has family hx of elevated blood pressure  Pt concerned aobut her weight  Trying ot ease into running  Hurt both knees, had trouble walking  [facility] not [facility] , "barely able to walk". 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 BMI 36. Vital stable. Neck supple lungs clear. Heart regular in rhythm bilateral knee pain to press pressure anterior patellar region. No effusion no joint laxity       Assessment & Plan:  Impression 1 bilateral knee pain likely overuse syndrome. #2 polycystic ovarian syndrome diagnosed when attempting to get pregnant. #3 morbid obesity discussed at length plan patient has had success with phentermine 4. Will prescribe this. Anti-inflammatory medication recommended for knees. Overuse syndrome approach discussed. Recheck in several months. WSL

## 2014-08-20 ENCOUNTER — Encounter: Payer: Self-pay | Admitting: Nurse Practitioner

## 2014-08-20 ENCOUNTER — Ambulatory Visit (INDEPENDENT_AMBULATORY_CARE_PROVIDER_SITE_OTHER): Payer: 59 | Admitting: Nurse Practitioner

## 2014-08-20 VITALS — BP 132/88 | Temp 98.5°F | Ht 66.0 in | Wt 223.0 lb

## 2014-08-20 DIAGNOSIS — J020 Streptococcal pharyngitis: Secondary | ICD-10-CM

## 2014-08-20 LAB — POCT RAPID STREP A (OFFICE): Rapid Strep A Screen: POSITIVE — AB

## 2014-08-20 MED ORDER — AZITHROMYCIN 250 MG PO TABS: ORAL_TABLET | ORAL | Status: DC

## 2014-08-21 ENCOUNTER — Encounter: Payer: Self-pay | Admitting: Nurse Practitioner

## 2014-08-21 NOTE — Progress Notes (Signed)
Subjective:  Presents with complaints of sore throat that began on 3/28. No fever. No rash. Taking fluids well. Voiding normal limit. No nausea or vomiting. No cough runny nose or headache.  Objective:   BP 132/88 mmHg  Temp(Src) 98.5 F (36.9 C) (Oral)  Ht 5\' 6"  (1.676 m)  Wt 223 lb (101.152 kg)  BMI 36.01 kg/m2  LMP 07/08/2014 (Approximate) NAD. Alert, oriented. TMs mild clear effusion, no erythema. Pharynx mild erythema, RST positive. Neck supple with mild soft anterior adenopathy. Lungs clear. Heart regular rate rhythm. Skin clear.  Assessment: Strep pharyngitis - Plan: POCT rapid strep A  Plan:  Meds ordered this encounter  Medications  . azithromycin (ZITHROMAX Z-PAK) 250 MG tablet    Sig: Take 2 tablets (500 mg) on  Day 1,  followed by 1 tablet (250 mg) once daily on Days 2 through 5.    Dispense:  6 each    Refill:  0    Order Specific Question:  Supervising Provider    Answer:  Mikey Kirschner [[id number]]   OTC meds as directed for symptomatic care. Callback in 48-72 hours if no improvement, sooner if worse.

## 2014-11-13 ENCOUNTER — Ambulatory Visit (INDEPENDENT_AMBULATORY_CARE_PROVIDER_SITE_OTHER): Payer: 59 | Admitting: Family Medicine

## 2014-11-13 ENCOUNTER — Encounter: Payer: Self-pay | Admitting: Family Medicine

## 2014-11-13 VITALS — BP 122/82 | Ht 66.0 in | Wt 211.1 lb

## 2014-11-13 DIAGNOSIS — M25562 Pain in left knee: Secondary | ICD-10-CM

## 2014-11-13 DIAGNOSIS — M25561 Pain in right knee: Secondary | ICD-10-CM | POA: Diagnosis not present

## 2014-11-13 DIAGNOSIS — R5383 Other fatigue: Secondary | ICD-10-CM | POA: Diagnosis not present

## 2014-11-13 DIAGNOSIS — E282 Polycystic ovarian syndrome: Secondary | ICD-10-CM | POA: Diagnosis not present

## 2014-11-13 MED ORDER — PHENTERMINE HCL 37.5 MG PO CAPS: 37.5000 mg | ORAL_CAPSULE | ORAL | Status: DC

## 2014-11-13 NOTE — Progress Notes (Signed)
   Subjective:    Patient ID: Tanya Sanders, female    DOB: 13-Jul-1985, 29 y.o.   MRN: [id number]  HPI Patient is here today for a follow up visit on morbid obesity. Patient is currently taking phentermine 37.5 mg daily.   Patient states that she wants to talk to the doctor about increasing the medication to 1 1/2 tablets daily.   Patient has no other concerns at this time.   Patient still noting fatigue at times. Excessive in nature. Some achiness in the joints. Primarily the knees.  No headache or chest pain Review of Systems No abdominal pain no change in bowel habits    Objective:   Physical Exam  Alert vitals stable blood pressure good on repeat lungs clear heart rare regular in rhythm ankles without edema      Assessment & Plan:  Impression excessive weight gain fatigue patient has lost 19 pounds on phentermine plan maintain firming dose next 3 months diet exercise discussed WSL

## 2015-04-14 ENCOUNTER — Telehealth: Payer: 59 | Admitting: Family

## 2015-04-14 DIAGNOSIS — A499 Bacterial infection, unspecified: Secondary | ICD-10-CM

## 2015-04-14 DIAGNOSIS — H109 Unspecified conjunctivitis: Secondary | ICD-10-CM

## 2015-04-14 DIAGNOSIS — H1089 Other conjunctivitis: Secondary | ICD-10-CM | POA: Diagnosis not present

## 2015-04-14 MED ORDER — OFLOXACIN 0.3 % OP SOLN: 1.0000 [drp] | Freq: Four times a day (QID) | OPHTHALMIC | Status: AC

## 2015-04-14 NOTE — Progress Notes (Signed)

## 2015-04-29 ENCOUNTER — Encounter: Payer: Self-pay | Admitting: Family Medicine

## 2015-04-30 MED ORDER — GENTAMICIN SULFATE 0.3 % OP SOLN: OPHTHALMIC | Status: DC

## 2015-07-23 DIAGNOSIS — H52203 Unspecified astigmatism, bilateral: Secondary | ICD-10-CM | POA: Diagnosis not present

## 2015-07-23 DIAGNOSIS — H5213 Myopia, bilateral: Secondary | ICD-10-CM | POA: Diagnosis not present

## 2015-07-31 MED FILL — LARIN 1.5 MG-30 MCG TABLET: 1.5-30 | 84 days supply | Qty: 63 | Fill #1

## 2015-08-03 ENCOUNTER — Emergency Department (HOSPITAL_COMMUNITY)
Admission: EM | Admit: 2015-08-03 | Discharge: 2015-08-03 | Disposition: A | Discharge status: Home / Self Care | Payer: 59 | Source: Home / Self Care | Attending: Emergency Medicine | Admitting: Emergency Medicine

## 2015-08-03 ENCOUNTER — Encounter (HOSPITAL_COMMUNITY): Payer: Self-pay | Admitting: Emergency Medicine

## 2015-08-03 DIAGNOSIS — J020 Streptococcal pharyngitis: Secondary | ICD-10-CM

## 2015-08-03 LAB — POCT RAPID STREP A: Streptococcus, Group A Screen (Direct): POSITIVE — AB

## 2015-08-03 MED ORDER — AZITHROMYCIN 250 MG PO TABS: ORAL_TABLET | ORAL | Status: DC

## 2015-08-03 NOTE — ED Notes (Signed)
C/o ST onset x4 days associated w/fatigue... Reports husband dx w/strep recently A&O x4... No acute distress.

## 2015-08-03 NOTE — ED Provider Notes (Signed)
CSN: [id number]     Arrival date & time 08/03/15  1300 History   First MD Initiated Contact with Patient 08/03/15 1324     Chief Complaint  Patient presents with  . Sore Throat   (Consider location/radiation/quality/duration/timing/severity/associated sxs/prior Treatment) Patient is a 30 y.o. female presenting with pharyngitis. The history is provided by the patient.  Sore Throat This is a new problem. The current episode started more than 2 days ago. The problem occurs constantly. The problem has not changed since onset.Pertinent negatives include no chest pain, no abdominal pain, no headaches and no shortness of breath. The symptoms are aggravated by swallowing. Nothing relieves the symptoms.    History reviewed. No pertinent past medical history. Past Surgical History  Procedure Laterality Date  . Cesarean section    . Cholecystectomy N/A 09/22/2012    Procedure: LAPAROSCOPIC CHOLECYSTECTOMY;  Surgeon: Scherry Ran, MD;  Location: [facility];  Service: General;  Laterality: N/A;  . Ercp N/A 10/06/2012    Procedure: ENDOSCOPIC RETROGRADE CHOLANGIOPANCREATOGRAPHY (ERCP);  Surgeon: Daneil Dolin, MD;  Location: [facility];  Service: Gastroenterology;  Laterality: N/A;  . Sphincterotomy N/A 10/06/2012    Procedure: SPHINCTEROTOMY;  Surgeon: Daneil Dolin, MD;  Location: [facility];  Service: Gastroenterology;  Laterality: N/A;  . Removal of stones N/A 10/06/2012    Procedure: REMOVAL OF STONES;  Surgeon: Daneil Dolin, MD;  Location: [facility];  Service: Gastroenterology;  Laterality: N/A;  . Cesarean section N/A 01/31/2014    Procedure: Repeat CESAREAN SECTION;  Surgeon: Princess Bruins, MD;  Location: [facility];  Service: Obstetrics;  Laterality: N/A;  EDD: 02/06/14   No family history on file. Social History  Substance Use Topics  . Smoking status: Former Smoker -- 1.00 packs/day for 11 years    Types: Cigarettes    Start date: 10/26/2001    Quit date: 08/22/2012  . Smokeless tobacco: Never  Used  . Alcohol Use: No   OB History    Gravida Para Term Preterm AB TAB SAB Ectopic Multiple Living   2 2 1       2      Review of Systems  Constitutional: Negative for fever, activity change and fatigue.  HENT: Positive for postnasal drip and sore throat.   Respiratory: Negative.  Negative for cough and shortness of breath.   Cardiovascular: Negative for chest pain.  Gastrointestinal: Negative for abdominal pain.  Musculoskeletal: Negative.   Skin: Negative.   Neurological: Negative.  Negative for headaches.    Allergies  Bactrim; Penicillins; and Sulfa antibiotics  Home Medications   Prior to Admission medications   Medication Sig Start Date End Date Taking? Authorizing Provider  azithromycin (ZITHROMAX) 250 MG tablet 2 tabs daily for 5 days 08/03/15   Janne Napoleon, NP  gentamicin (GARAMYCIN) 0.3 % ophthalmic solution Take 2 drops QID to affected eye for five days 04/30/15   Mikey Kirschner, MD  phentermine 37.5 MG capsule Take 1 capsule (37.5 mg total) by mouth every morning. 11/13/14   Mikey Kirschner, MD  UNKNOWN TO PATIENT BCPs    Historical Provider, MD   Meds Ordered and Administered this Visit  Medications - No data to display  BP 109/66 mmHg  Pulse 90  Temp(Src) 98 F (36.7 C) (Oral)  Resp 18  SpO2 99%  LMP 07/13/2015  Breastfeeding? No No data found.   Physical Exam  Constitutional: She is oriented to person, place, and time. She appears well-developed and well-nourished. No distress.  HENT:  Mouth/Throat: No oropharyngeal exudate.  Bilateral TMs are retracted. No erythema. Oropharynx with erythema and mild swelling. No exudates. Copious amount of clear to frothy PND.  Eyes: Conjunctivae and EOM are normal.  Neck: Normal range of motion. Neck supple.  Cardiovascular: Normal rate, regular rhythm and normal heart sounds.   Pulmonary/Chest: Effort normal and breath sounds normal. No respiratory distress.  Musculoskeletal: Normal range of motion. She  exhibits no edema.  Lymphadenopathy:    She has no cervical adenopathy.  Neurological: She is alert and oriented to person, place, and time.  Skin: Skin is warm and dry. No rash noted.  Psychiatric: She has a normal mood and affect.  Nursing note and vitals reviewed.   ED Course  Procedures (including critical care time)  Labs Review Labs Reviewed  POCT RAPID STREP A - Abnormal; Notable for the following:    Streptococcus, Group A Screen (Direct) POSITIVE (*)    All other components within normal limits    Imaging Review No results found.   Visual Acuity Review  Right Eye Distance:   Left Eye Distance:   Bilateral Distance:    Right Eye Near:   Left Eye Near:    Bilateral Near:         MDM   1. Strep pharyngitis    Ibuprofen 600 mg every 6 hours as needed Cepacol lozenges for sore throat Drink plenty of fluids and stay well-hydrated Azithromycin as directed.     Janne Napoleon, NP 08/03/15 1337

## 2015-08-03 NOTE — Discharge Instructions (Signed)
Sore Throat Ibuprofen 600 mg every 6 hours as needed Cepacol lozenges for sore throat Drink plenty of fluids and stay well-hydrated Azithromycin as directed. A sore throat is pain, burning, irritation, or scratchiness of the throat. There is often pain or tenderness when swallowing or talking. A sore throat may be accompanied by other symptoms, such as coughing, sneezing, fever, and swollen neck glands. A sore throat is often the first sign of another sickness, such as a cold, flu, strep throat, or mononucleosis (commonly known as mono). Most sore throats go away without medical treatment. CAUSES  The most common causes of a sore throat include:  A viral infection, such as a cold, flu, or mono.  A bacterial infection, such as strep throat, tonsillitis, or whooping cough.  Seasonal allergies.  Dryness in the air.  Irritants, such as smoke or pollution.  Gastroesophageal reflux disease (GERD). HOME CARE INSTRUCTIONS   Only take over-the-counter medicines as directed by your caregiver.  Drink enough fluids to keep your urine clear or pale yellow.  Rest as needed.  Try using throat sprays, lozenges, or sucking on hard candy to ease any pain (if older than 4 years or as directed).  Sip warm liquids, such as broth, herbal tea, or warm water with honey to relieve pain temporarily. You may also eat or drink cold or frozen liquids such as frozen ice pops.  Gargle with salt water (mix 1 tsp salt with 8 oz of water).  Do not smoke and avoid secondhand smoke.  Put a cool-mist humidifier in your bedroom at night to moisten the air. You can also turn on a hot shower and sit in the bathroom with the door closed for 5-10 minutes. SEEK IMMEDIATE MEDICAL CARE IF:  You have difficulty breathing.  You are unable to swallow fluids, soft foods, or your saliva.  You have increased swelling in the throat.  Your sore throat does not get better in 7 days.  You have nausea and vomiting.  You  have a fever or persistent symptoms for more than 2-3 days.  You have a fever and your symptoms suddenly get worse. MAKE SURE YOU:   Understand these instructions.  Will watch your condition.  Will get help right away if you are not doing well or get worse.   This information is not intended to replace advice given to you by your health care provider. Make sure you discuss any questions you have with your health care provider.   Document Released: 06/17/2004 Document Revised: 05/31/2014 Document Reviewed: 01/16/2012 Elsevier Interactive Patient Education 2016 Else[employer]  Strep Throat Strep throat is a bacterial infection of the throat. Your health care provider may call the infection tonsillitis or pharyngitis, depending on whether there is swelling in the tonsils or at the back of the throat. Strep throat is most common during the cold months of the year in children who are 97-9 years of age, but it can happen during any season in people of any age. This infection is spread from person to person (contagious) through coughing, sneezing, or close contact. CAUSES Strep throat is caused by the bacteria called Streptococcus pyogenes. RISK FACTORS This condition is more likely to develop in:  People who spend time in crowded places where the infection can spread easily.  People who have close contact with someone who has strep throat. SYMPTOMS Symptoms of this condition include:  Fever or chills.   Redness, swelling, or pain in the tonsils or throat.  Pain or difficulty  when swallowing.  White or yellow spots on the tonsils or throat.  Swollen, tender glands in the neck or under the jaw.  Red rash all over the body (rare). DIAGNOSIS This condition is diagnosed by performing a rapid strep test or by taking a swab of your throat (throat culture test). Results from a rapid strep test are usually ready in a few minutes, but throat culture test results are available after one or  two days. TREATMENT This condition is treated with antibiotic medicine. HOME CARE INSTRUCTIONS Medicines  Take over-the-counter and prescription medicines only as told by your health care provider.  Take your antibiotic as told by your health care provider. Do not stop taking the antibiotic even if you start to feel better.  Have family members who also have a sore throat or fever tested for strep throat. They may need antibiotics if they have the strep infection. Eating and Drinking  Do not share food, drinking cups, or personal items that could cause the infection to spread to other people.  If swallowing is difficult, try eating soft foods until your sore throat feels better.  Drink enough fluid to keep your urine clear or pale yellow. General Instructions  Gargle with a salt-water mixture 3-4 times per day or as needed. To make a salt-water mixture, completely dissolve -1 tsp of salt in 1 cup of warm water.  Make sure that all household members wash their hands well.  Get plenty of rest.  Stay home from school or work until you have been taking antibiotics for 24 hours.  Keep all follow-up visits as told by your health care provider. This is important. SEEK MEDICAL CARE IF:  The glands in your neck continue to get bigger.  You develop a rash, cough, or earache.  You cough up a thick liquid that is green, yellow-brown, or bloody.  You have pain or discomfort that does not get better with medicine.  Your problems seem to be getting worse rather than better.  You have a fever. SEEK IMMEDIATE MEDICAL CARE IF:  You have new symptoms, such as vomiting, severe headache, stiff or painful neck, chest pain, or shortness of breath.  You have severe throat pain, drooling, or changes in your voice.  You have swelling of the neck, or the skin on the neck becomes red and tender.  You have signs of dehydration, such as fatigue, dry mouth, and decreased urination.  You become  increasingly sleepy, or you cannot wake up completely.  Your joints become red or painful.   This information is not intended to replace advice given to you by your health care provider. Make sure you discuss any questions you have with your health care provider.   Document Released: 05/07/2000 Document Revised: 01/29/2015 Document Reviewed: 09/02/2014 Elsevier Interactive Patient Education [employer].

## 2015-08-06 ENCOUNTER — Encounter: Payer: Self-pay | Admitting: Nurse Practitioner

## 2015-09-14 ENCOUNTER — Encounter: Payer: Self-pay | Admitting: Family Medicine

## 2015-10-14 MED FILL — LARIN 1.5 MG-30 MCG TABLET: 1.5-30 | 84 days supply | Qty: 63 | Fill #2

## 2016-01-01 ENCOUNTER — Telehealth: Payer: Self-pay | Admitting: Family Medicine

## 2016-01-01 DIAGNOSIS — Z13220 Encounter for screening for lipoid disorders: Secondary | ICD-10-CM

## 2016-01-01 DIAGNOSIS — E282 Polycystic ovarian syndrome: Secondary | ICD-10-CM

## 2016-01-01 DIAGNOSIS — Z131 Encounter for screening for diabetes mellitus: Secondary | ICD-10-CM

## 2016-01-01 DIAGNOSIS — Z79899 Other long term (current) drug therapy: Secondary | ICD-10-CM

## 2016-01-01 NOTE — Telephone Encounter (Signed)
Patient would like blood work ordered for upcoming physical in September.

## 2016-01-05 NOTE — Telephone Encounter (Signed)
Lip iv M7 A1c

## 2016-01-05 NOTE — Telephone Encounter (Addendum)
Blood work ordered in EPIC. Patient notified. 

## 2016-01-07 ENCOUNTER — Ambulatory Visit: Payer: 59 | Admitting: Family Medicine

## 2016-01-09 MED FILL — LARIN 1.5 MG-30 MCG TABLET: 1.5-30 | 84 days supply | Qty: 63 | Fill #3

## 2016-01-15 DIAGNOSIS — Z131 Encounter for screening for diabetes mellitus: Secondary | ICD-10-CM | POA: Diagnosis not present

## 2016-01-15 DIAGNOSIS — Z79899 Other long term (current) drug therapy: Secondary | ICD-10-CM | POA: Diagnosis not present

## 2016-01-15 DIAGNOSIS — Z13220 Encounter for screening for lipoid disorders: Secondary | ICD-10-CM | POA: Diagnosis not present

## 2016-01-15 DIAGNOSIS — E282 Polycystic ovarian syndrome: Secondary | ICD-10-CM | POA: Diagnosis not present

## 2016-01-16 LAB — LIPID PANEL
CHOLESTEROL TOTAL: 177 mg/dL (ref 100–199)
Chol/HDL Ratio: 3.7 ratio units (ref 0.0–4.4)
HDL: 48 mg/dL (ref >39)
LDL Calculated: 111 mg/dL — ABNORMAL HIGH (ref 0–99)
Triglycerides: 88 mg/dL (ref 0–149)
VLDL Cholesterol Cal: 18 mg/dL (ref 5–40)

## 2016-01-16 LAB — HEPATIC FUNCTION PANEL
ALK PHOS: 44 IU/L (ref 39–117)
ALT: 19 IU/L (ref 0–32)
AST: 23 IU/L (ref 0–40)
Albumin: 4.1 g/dL (ref 3.5–5.5)
BILIRUBIN, DIRECT: 0.12 mg/dL (ref 0.00–0.40)
Bilirubin Total: 0.5 mg/dL (ref 0.0–1.2)
Total Protein: 6.7 g/dL (ref 6.0–8.5)

## 2016-01-16 LAB — BASIC METABOLIC PANEL
BUN / CREAT RATIO: 16 (ref 9–23)
BUN: 14 mg/dL (ref 6–20)
CO2: 22 mmol/L (ref 18–29)
Calcium: 9.2 mg/dL (ref 8.7–10.2)
Chloride: 106 mmol/L (ref 96–106)
Creatinine, Ser: 0.88 mg/dL (ref 0.57–1.00)
GFR calc Af Amer: 102 mL/min/{1.73_m2} (ref >59)
GFR calc non Af Amer: 88 mL/min/{1.73_m2} (ref >59)
GLUCOSE: 95 mg/dL (ref 65–99)
POTASSIUM: 4.6 mmol/L (ref 3.5–5.2)
SODIUM: 142 mmol/L (ref 134–144)

## 2016-01-16 LAB — HEMOGLOBIN A1C
Est. average glucose Bld gHb Est-mCnc: 105 mg/dL
HEMOGLOBIN A1C: 5.3 % (ref 4.8–5.6)

## 2016-01-28 ENCOUNTER — Ambulatory Visit (INDEPENDENT_AMBULATORY_CARE_PROVIDER_SITE_OTHER): Payer: 59 | Admitting: Family Medicine

## 2016-01-28 ENCOUNTER — Encounter: Payer: Self-pay | Admitting: Family Medicine

## 2016-01-28 VITALS — BP 132/76 | Ht 66.0 in | Wt 214.0 lb

## 2016-01-28 DIAGNOSIS — Z0000 Encounter for general adult medical examination without abnormal findings: Secondary | ICD-10-CM

## 2016-01-28 NOTE — Progress Notes (Signed)
Subjective:    Patient ID: Tanya Sanders, female    DOB: [birth year], 30 y.o.   MRN: [id number]  HPI The patient comes in today for a wellness visit.   Results for orders placed or performed in visit on 01/01/16  Lipid panel  Result Value Ref Range   Cholesterol, Total 177 100 - 199 mg/dL   Triglycerides 88 0 - 149 mg/dL   HDL 48 >39 mg/dL   VLDL Cholesterol Cal 18 5 - 40 mg/dL   LDL Calculated 111 (H) 0 - 99 mg/dL   Chol/HDL Ratio 3.7 0.0 - 4.4 ratio units  Hepatic function panel  Result Value Ref Range   Total Protein 6.7 6.0 - 8.5 g/dL   Albumin 4.1 3.5 - 5.5 g/dL   Bilirubin Total 0.5 0.0 - 1.2 mg/dL   Bilirubin, Direct 0.12 0.00 - 0.40 mg/dL   Alkaline Phosphatase 44 39 - 117 IU/L   AST 23 0 - 40 IU/L   ALT 19 0 - 32 IU/L  Basic metabolic panel  Result Value Ref Range   Glucose 95 65 - 99 mg/dL   BUN 14 6 - 20 mg/dL   Creatinine, Ser 0.88 0.57 - 1.00 mg/dL   GFR calc non Af Amer 88 >59 mL/min/1.73   GFR calc Af Amer 102 >59 mL/min/1.73   BUN/Creatinine Ratio 16 9 - 23   Sodium 142 134 - 144 mmol/L   Potassium 4.6 3.5 - 5.2 mmol/L   Chloride 106 96 - 106 mmol/L   CO2 22 18 - 29 mmol/L   Calcium 9.2 8.7 - 10.2 mg/dL  Hemoglobin A1c  Result Value Ref Range   Hgb A1c MFr Bld 5.3 4.8 - 5.6 %   Est. average glucose Bld gHb Est-mCnc 105 mg/dL  no fam hx  No fam hx of colon ca or b ca  Flu shots yrly   elev Bp in family knees pop but not swell  No sig pain or   A review of their health history was completed.  A review of medications was also completed.  Any needed refills; None   Eating habits: Patient states eating habits are good.  Falls/  MVA accidents in past few months: None   Regular exercise: Does cardio routine 40 minutes daily. Past two mo reg cardio and pus activity  Trying to watch claories  Watching weight   Lost six poundsResistance 20-30 minutes daily  Specialist pt sees on regular basis: None   Preventative health issues were  discussed.   Additional concerns: States no other concerns this visit.   Patient sees GYN for pap smears and women's health related issues. Review of Systems  Constitutional: Negative for activity change, appetite change and fatigue.  HENT: Negative for congestion, ear discharge and rhinorrhea.   Eyes: Negative for discharge.  Respiratory: Negative for cough, chest tightness and wheezing.   Cardiovascular: Negative for chest pain.  Gastrointestinal: Negative for abdominal pain and vomiting.  Genitourinary: Negative for difficulty urinating and frequency.  Musculoskeletal: Negative for neck pain.  Allergic/Immunologic: Negative for environmental allergies and food allergies.  Neurological: Negative for weakness and headaches.  Psychiatric/Behavioral: Negative for agitation and behavioral problems.  All other systems reviewed and are negative.      Objective:   Physical Exam  Constitutional: She is oriented to person, place, and time. She appears well-developed and well-nourished.  HENT:  Head: Normocephalic.  Right Ear: External ear normal.  Left Ear: External ear normal.  Eyes: Pupils are equal,  round, and reactive to light.  Neck: Normal range of motion. No thyromegaly present.  Cardiovascular: Normal rate, regular rhythm, normal heart sounds and intact distal pulses.   No murmur heard. Pulmonary/Chest: Effort normal and breath sounds normal. No respiratory distress. She has no wheezes.  Abdominal: Soft. Bowel sounds are normal. She exhibits no distension and no mass. There is no tenderness.  Musculoskeletal: Normal range of motion. She exhibits no edema or tenderness.  Lymphadenopathy:    She has no cervical adenopathy.  Neurological: She is alert and oriented to person, place, and time. She exhibits normal muscle tone.  Skin: Skin is warm and dry.  Psychiatric: She has a normal mood and affect. Her behavior is normal.          Assessment & Plan:  Impression well  adult exam #2 patient is clinically overweight on discussion held in this #3 history of borderline elevated glucose No. 4 blood work discussed plan diet exercise discussed. Stress reduction discussed. Patient 3 see flu shot this fall. Patient to go to GYN for the resting exam. Patient at that time plans to have a substantial discussion with her GYN regarding her polycystic ovarian syndrome

## 2016-02-05 ENCOUNTER — Encounter: Payer: Self-pay | Admitting: Family Medicine

## 2016-03-29 MED FILL — LARIN 1.5 MG-30 MCG TABLET: 1.5-30 | 84 days supply | Qty: 63 | Fill #4

## 2016-04-20 DIAGNOSIS — Z113 Encounter for screening for infections with a predominantly sexual mode of transmission: Secondary | ICD-10-CM | POA: Diagnosis not present

## 2016-04-20 DIAGNOSIS — Z6834 Body mass index (BMI) 34.0-34.9, adult: Secondary | ICD-10-CM | POA: Diagnosis not present

## 2016-04-20 DIAGNOSIS — Z01419 Encounter for gynecological examination (general) (routine) without abnormal findings: Secondary | ICD-10-CM | POA: Diagnosis not present

## 2016-04-20 DIAGNOSIS — Z1151 Encounter for screening for human papillomavirus (HPV): Secondary | ICD-10-CM | POA: Diagnosis not present

## 2016-05-18 ENCOUNTER — Ambulatory Visit (INDEPENDENT_AMBULATORY_CARE_PROVIDER_SITE_OTHER): Payer: 59 | Admitting: Family Medicine

## 2016-05-18 ENCOUNTER — Encounter: Payer: Self-pay | Admitting: Family Medicine

## 2016-05-18 VITALS — Temp 98.4°F | Ht 66.0 in | Wt 209.0 lb

## 2016-05-18 DIAGNOSIS — J029 Acute pharyngitis, unspecified: Secondary | ICD-10-CM | POA: Diagnosis not present

## 2016-05-18 DIAGNOSIS — J020 Streptococcal pharyngitis: Secondary | ICD-10-CM

## 2016-05-18 LAB — POCT RAPID STREP A (OFFICE): Rapid Strep A Screen: POSITIVE — AB

## 2016-05-18 MED ORDER — AZITHROMYCIN 250 MG PO TABS: ORAL_TABLET | ORAL | 0 refills | Status: DC

## 2016-05-18 NOTE — Progress Notes (Signed)
3

## 2016-05-18 NOTE — Progress Notes (Signed)
   Subjective:    Patient ID: Tanya Sanders, female    DOB: 1986/02/27, 30 y.o.   MRN: [id number]  Sore Throat   This is a new problem. The current episode started yesterday. Associated symptoms include ear pain. She has tried NSAIDs for the symptoms.  Severe sore throat over the past several days started noticing on the weekend then got worse yesterday pain discomfort low-grade fever denies high fever chills denies sinus pressure pain discomfort nausea vomiting diarrhea he made PMH benign    Review of Systems  HENT: Positive for ear pain.    See above    Objective:   Physical Exam Throat erythematous neck minimal adenopathy eardrums normal lungs clear heart regular       Assessment & Plan:  Strep pharyngitis recommend Z-Pak as directed warning signs discussed follow-up if problems. Should gradually get better. Tylenol for aches and pains. Rest up as much is possible. Warning signs regarding pharyngeal abscess discussed

## 2016-05-20 ENCOUNTER — Encounter (HOSPITAL_COMMUNITY): Payer: Self-pay

## 2016-05-20 ENCOUNTER — Emergency Department (HOSPITAL_COMMUNITY)
Admission: EM | Admit: 2016-05-20 | Discharge: 2016-05-20 | Disposition: A | Discharge status: Home / Self Care | Payer: 59 | Attending: Emergency Medicine | Admitting: Emergency Medicine

## 2016-05-20 DIAGNOSIS — Z87891 Personal history of nicotine dependence: Secondary | ICD-10-CM | POA: Insufficient documentation

## 2016-05-20 DIAGNOSIS — J039 Acute tonsillitis, unspecified: Secondary | ICD-10-CM | POA: Diagnosis not present

## 2016-05-20 DIAGNOSIS — J029 Acute pharyngitis, unspecified: Secondary | ICD-10-CM | POA: Diagnosis not present

## 2016-05-20 DIAGNOSIS — J36 Peritonsillar abscess: Secondary | ICD-10-CM | POA: Diagnosis not present

## 2016-05-20 HISTORY — DX: Peritonsillar abscess: J36

## 2016-05-20 LAB — CBC WITH DIFFERENTIAL/PLATELET
BASOS ABS: 0 10*3/uL (ref 0.0–0.1)
BASOS PCT: 0 %
Eosinophils Absolute: 0 10*3/uL (ref 0.0–0.7)
Eosinophils Relative: 0 %
HEMATOCRIT: 37 % (ref 36.0–46.0)
Hemoglobin: 12.4 g/dL (ref 12.0–15.0)
Lymphocytes Relative: 15 %
Lymphs Abs: 1.7 10*3/uL (ref 0.7–4.0)
MCH: 28.8 pg (ref 26.0–34.0)
MCHC: 33.5 g/dL (ref 30.0–36.0)
MCV: 85.8 fL (ref 78.0–100.0)
Monocytes Absolute: 0.9 10*3/uL (ref 0.1–1.0)
Monocytes Relative: 8 %
NEUTROS ABS: 9 10*3/uL — AB (ref 1.7–7.7)
Neutrophils Relative %: 77 %
Platelets: 257 10*3/uL (ref 150–400)
RBC: 4.31 MIL/uL (ref 3.87–5.11)
RDW: 12.6 % (ref 11.5–15.5)
WBC: 11.7 10*3/uL — ABNORMAL HIGH (ref 4.0–10.5)

## 2016-05-20 LAB — BASIC METABOLIC PANEL
ANION GAP: 6 (ref 5–15)
BUN: 9 mg/dL (ref 6–20)
CHLORIDE: 106 mmol/L (ref 101–111)
CO2: 25 mmol/L (ref 22–32)
Calcium: 8.8 mg/dL — ABNORMAL LOW (ref 8.9–10.3)
Creatinine, Ser: 0.67 mg/dL (ref 0.44–1.00)
GFR calc Af Amer: >60 mL/min (ref >60)
GFR calc non Af Amer: >60 mL/min (ref >60)
Glucose, Bld: 106 mg/dL — ABNORMAL HIGH (ref 65–99)
Potassium: 3.8 mmol/L (ref 3.5–5.1)
Sodium: 137 mmol/L (ref 135–145)

## 2016-05-20 MED ORDER — ZOLPIDEM TARTRATE 5 MG PO TABS: 5.0000 mg | ORAL_TABLET | Freq: Every evening | ORAL | 0 refills | Status: DC | PRN

## 2016-05-20 MED ORDER — ONDANSETRON HCL 4 MG/2ML IJ SOLN: 4.0000 mg | Freq: Once | INTRAMUSCULAR | Status: DC

## 2016-05-20 MED ORDER — ONDANSETRON HCL 8 MG PO TABS: 8.0000 mg | ORAL_TABLET | ORAL | 0 refills | Status: DC | PRN

## 2016-05-20 MED ORDER — SODIUM CHLORIDE 0.9 % IV BOLUS (SEPSIS)
1000.0000 mL | Freq: Once | INTRAVENOUS | Status: AC
  Administered 2016-05-20: 1000 mL via INTRAVENOUS

## 2016-05-20 MED ORDER — OXYCODONE-ACETAMINOPHEN 5-325 MG PO TABS: 1.0000 | ORAL_TABLET | ORAL | 0 refills | Status: DC | PRN

## 2016-05-20 MED ORDER — FENTANYL CITRATE (PF) 100 MCG/2ML IJ SOLN
50.0000 ug | Freq: Once | INTRAMUSCULAR | Status: AC
  Administered 2016-05-20: 50 ug via INTRAVENOUS
  Filled 2016-05-20: qty 2

## 2016-05-20 MED ORDER — KETOROLAC TROMETHAMINE 30 MG/ML IJ SOLN
30.0000 mg | Freq: Once | INTRAMUSCULAR | Status: AC
  Administered 2016-05-20: 30 mg via INTRAVENOUS
  Filled 2016-05-20: qty 1

## 2016-05-20 MED ORDER — DEXAMETHASONE SODIUM PHOSPHATE 4 MG/ML IJ SOLN
8.0000 mg | Freq: Once | INTRAMUSCULAR | Status: AC
  Administered 2016-05-20: 8 mg via INTRAVENOUS
  Filled 2016-05-20: qty 2

## 2016-05-20 MED ORDER — CLINDAMYCIN PHOSPHATE 300 MG/50ML IV SOLN
300.0000 mg | Freq: Once | INTRAVENOUS | Status: AC
  Administered 2016-05-20: 300 mg via INTRAVENOUS
  Filled 2016-05-20: qty 50

## 2016-05-20 MED ORDER — FENTANYL CITRATE (PF) 100 MCG/2ML IJ SOLN: 50.0000 ug | Freq: Once | INTRAMUSCULAR | Status: DC

## 2016-05-20 MED ORDER — OXYCODONE-ACETAMINOPHEN 5-325 MG PO TABS
ORAL_TABLET | ORAL | Status: AC
  Administered 2016-05-20: 2
  Filled 2016-05-20: qty 2

## 2016-05-20 NOTE — ED Provider Notes (Signed)
[facility] DEPT Provider Note   CSN: [id number] Arrival date & time: 05/20/16  [id number]  By signing my name below, I, Tanya Sanders, attest that this documentation has been prepared under the direction and in the presence of Nat Christen, MD. Electronically Signed: Gwenlyn Sanders, ED Scribe. 05/20/16. 9:20 AM.   History   Chief Complaint Chief Complaint  Patient presents with  . Sore Throat   The history is provided by the patient. No language interpreter was used.   HPI Comments: Tanya Sanders is a 30 y.o. female who presents to the Emergency Department complaining of gradual onset, constant, moderate sore throat for 3 days. She states pain is worse on the right side. Pt reports associated right ear pain and right lateral neck fullness. She was seen by PCP 2 days PTA and tested positive for strep throat. She was prescribed Zithromax, but continues to experience persistent sore throat. Pt has PMHx of x3 Strep throat and 1 episode of Peritonsillar abscess as a child.   Past Medical History:  Diagnosis Date  . Abscess of tonsil     Patient Active Problem List   Diagnosis Date Noted  . Polycystic ovarian syndrome 08/12/2014  . Morbid obesity ([place]) 08/12/2014  . Postoperative state 01/31/2014  . Postpartum care following cesarean delivery (9/10) 01/31/2014    Past Surgical History:  Procedure Laterality Date  . CESAREAN SECTION    . CESAREAN SECTION N/A 01/31/2014   Procedure: Repeat CESAREAN SECTION;  Surgeon: Princess Bruins, MD;  Location: [facility];  Service: Obstetrics;  Laterality: N/A;  EDD: 02/06/14  . CHOLECYSTECTOMY N/A 09/22/2012   Procedure: LAPAROSCOPIC CHOLECYSTECTOMY;  Surgeon: Scherry Ran, MD;  Location: [facility];  Service: General;  Laterality: N/A;  . ERCP N/A 10/06/2012   Procedure: ENDOSCOPIC RETROGRADE CHOLANGIOPANCREATOGRAPHY (ERCP);  Surgeon: Daneil Dolin, MD;  Location: [facility];  Service: Gastroenterology;  Laterality: N/A;  . REMOVAL OF STONES N/A  10/06/2012   Procedure: REMOVAL OF STONES;  Surgeon: Daneil Dolin, MD;  Location: [facility];  Service: Gastroenterology;  Laterality: N/A;  . SPHINCTEROTOMY N/A 10/06/2012   Procedure: SPHINCTEROTOMY;  Surgeon: Daneil Dolin, MD;  Location: [facility];  Service: Gastroenterology;  Laterality: N/A;    OB History    Gravida Para Term Preterm AB Living   2 2 1     2    SAB TAB Ectopic Multiple Live Births           1       Home Medications    Prior to Admission medications   Medication Sig Start Date End Date Taking? Authorizing Provider  azithromycin (ZITHROMAX Z-PAK) 250 MG tablet Take 2 tablets (500 mg) on  Day 1,  followed by 1 tablet (250 mg) once daily on Days 2 through 5. 05/18/16   Kathyrn Drown, MD  azithromycin (ZITHROMAX) 250 MG tablet 2 tabs daily for 5 days Patient not taking: Reported on 01/28/2016 08/03/15   Janne Napoleon, NP  gentamicin (GARAMYCIN) 0.3 % ophthalmic solution Take 2 drops QID to affected eye for five days Patient not taking: Reported on 01/28/2016 04/30/15   Mikey Kirschner, MD  ondansetron (ZOFRAN) 8 MG tablet Take 1 tablet (8 mg total) by mouth every 4 (four) hours as needed. 05/20/16   Nat Christen, MD  oxyCODONE-acetaminophen (PERCOCET) 5-325 MG tablet Take 1-2 tablets by mouth every 4 (four) hours as needed. 05/20/16   Nat Christen, MD  phentermine 37.5 MG capsule Take 1 capsule (37.5 mg total) by  mouth every morning. Patient not taking: Reported on 01/28/2016 11/13/14   Mikey Kirschner, MD  UNKNOWN TO PATIENT BCPs    Historical Provider, MD  zolpidem (AMBIEN) 5 MG tablet Take 1 tablet (5 mg total) by mouth at bedtime as needed for sleep. 05/20/16   Nat Christen, MD    Family History No family history on file.  Social History Social History  Substance Use Topics  . Smoking status: Former Smoker    Packs/day: 1.00    Years: 11.00    Types: Cigarettes    Start date: 10/26/2001    Quit date: 08/22/2012  . Smokeless tobacco: Never Used  . Alcohol use No      Allergies   Bactrim [sulfamethoxazole-trimethoprim]; Penicillins; and Sulfa antibiotics   Review of Systems Review of Systems A complete 10 system review of systems was obtained and all systems are negative except as noted in the HPI and PMH.    Physical Exam Updated Vital Signs BP 139/84   Pulse 98   Temp 98.2 F (36.8 C) (Oral)   Resp 17   Ht 5\' 6"  (1.676 m)   Wt 203 lb (92.1 kg)   LMP 05/19/2016 (Approximate)   SpO2 99%   BMI 32.77 kg/m   Physical Exam  Constitutional: She is oriented to person, place, and time.  Alert, no acute distress, able to swallow fluids  HENT:  Head: Normocephalic and atraumatic.  Tenderness and edema to the right oropharyngeal area compared to the left   Eyes: Conjunctivae are normal.  Neck: Neck supple.  Slight tenderness and fullness to the right lateral   Cardiovascular: Normal rate and regular rhythm.   Pulmonary/Chest: Effort normal and breath sounds normal.  Abdominal: Soft. Bowel sounds are normal.  Musculoskeletal: Normal range of motion.  Neurological: She is alert and oriented to person, place, and time.  Skin: Skin is warm and dry.  Psychiatric: She has a normal mood and affect. Her behavior is normal.  Nursing note and vitals reviewed.    ED Treatments / Results  DIAGNOSTIC STUDIES: Oxygen Saturation is 98% on RA, normal by my interpretation.    COORDINATION OF CARE: 8:50 AM Discussed treatment plan with pt at bedside which includes fluids, steroids, pain management and reevaluation in a few hours and pt agreed to plan.  Labs (all labs ordered are listed, but only abnormal results are displayed) Labs Reviewed  CBC WITH DIFFERENTIAL/PLATELET - Abnormal; Notable for the following:       Result Value   WBC 11.7 (*)    Neutro Abs 9.0 (*)    All other components within normal limits  BASIC METABOLIC PANEL - Abnormal; Notable for the following:    Glucose, Bld 106 (*)    Calcium 8.8 (*)    All other components  within normal limits    EKG  EKG Interpretation None       Radiology No results found.  Procedures Procedures (including critical care time)  Medications Ordered in ED Medications  sodium chloride 0.9 % bolus 1,000 mL (0 mLs Intravenous Stopped 05/20/16 1147)  dexamethasone (DECADRON) injection 8 mg (8 mg Intravenous Given 05/20/16 0825)  sodium chloride 0.9 % bolus 1,000 mL (0 mLs Intravenous Stopped 05/20/16 1019)  ketorolac (TORADOL) 30 MG/ML injection 30 mg (30 mg Intravenous Given 05/20/16 0838)  fentaNYL (SUBLIMAZE) injection 50 mcg (50 mcg Intravenous Given 05/20/16 1155)  clindamycin (CLEOCIN) IVPB 300 mg (0 mg Intravenous Stopped 05/20/16 1416)  oxyCODONE-acetaminophen (PERCOCET/ROXICET) 5-325 MG per tablet (2  tablets  Given 05/20/16 1425)     Initial Impression / Assessment and Plan / ED Course  I have reviewed the triage vital signs and the nursing notes.  Pertinent labs & imaging results that were available during my care of the patient were reviewed by me and considered in my medical decision making (see chart for details).  Clinical Course    Suspect early peritonsillar abscess in the right. IV fluids, pain management, steroids, clindamycin.  Discussed with ENT. Will be evaluated by otolaryngologist at 3:20 PM today.  Discharge medications Percocet, Zofran 8 mg, Ambien 5 mg.  Discussed with patient and her husband. She is stable to be discharged and follow-up in the ENT office.  Final Clinical Impressions(s) / ED Diagnoses   Final diagnoses:  Pharyngitis, unspecified etiology    New Prescriptions Discharge Medication List as of 05/20/2016  1:04 PM    START taking these medications   Details  ondansetron (ZOFRAN) 8 MG tablet Take 1 tablet (8 mg total) by mouth every 4 (four) hours as needed., Starting Thu 05/20/2016, Print    oxyCODONE-acetaminophen (PERCOCET) 5-325 MG tablet Take 1-2 tablets by mouth every 4 (four) hours as needed., Starting Thu  05/20/2016, Print    zolpidem (AMBIEN) 5 MG tablet Take 1 tablet (5 mg total) by mouth at bedtime as needed for sleep., Starting Thu 05/20/2016, Print       I personally performed the services described in this documentation, which was scribed in my presence. The recorded information has been reviewed and is accurate.     Nat Christen, MD 05/21/16 0730

## 2016-05-20 NOTE — ED Triage Notes (Signed)
Pt reports sore throat x 2 days. Pt reports going to PCP Tuesday and starting a Z-pack after being diagnosed with strep throat- pt reports having hx of tonsilar abscess and says it feels similar.

## 2016-05-20 NOTE — Discharge Instructions (Signed)
Appointment today with Dr. Constance Holster at 3:40 PM. Prescription for pain medication, sleeping medication, nausea medication

## 2016-05-22 ENCOUNTER — Emergency Department (HOSPITAL_COMMUNITY): Payer: 59

## 2016-05-22 ENCOUNTER — Emergency Department (HOSPITAL_COMMUNITY)
Admission: EM | Admit: 2016-05-22 | Discharge: 2016-05-22 | Disposition: A | Discharge status: Home / Self Care | Payer: 59 | Attending: Emergency Medicine | Admitting: Emergency Medicine

## 2016-05-22 ENCOUNTER — Encounter (HOSPITAL_COMMUNITY): Payer: Self-pay

## 2016-05-22 DIAGNOSIS — J0300 Acute streptococcal tonsillitis, unspecified: Secondary | ICD-10-CM | POA: Diagnosis not present

## 2016-05-22 DIAGNOSIS — J029 Acute pharyngitis, unspecified: Secondary | ICD-10-CM | POA: Diagnosis not present

## 2016-05-22 DIAGNOSIS — Z79899 Other long term (current) drug therapy: Secondary | ICD-10-CM | POA: Insufficient documentation

## 2016-05-22 DIAGNOSIS — J36 Peritonsillar abscess: Secondary | ICD-10-CM | POA: Insufficient documentation

## 2016-05-22 DIAGNOSIS — Z87891 Personal history of nicotine dependence: Secondary | ICD-10-CM | POA: Insufficient documentation

## 2016-05-22 LAB — CBC WITH DIFFERENTIAL/PLATELET
BASOS ABS: 0 10*3/uL (ref 0.0–0.1)
Basophils Relative: 0 %
Eosinophils Absolute: 0 10*3/uL (ref 0.0–0.7)
Eosinophils Relative: 0 %
HEMATOCRIT: 34.5 % — AB (ref 36.0–46.0)
HEMOGLOBIN: 11.6 g/dL — AB (ref 12.0–15.0)
LYMPHS PCT: 19 %
Lymphs Abs: 1.9 10*3/uL (ref 0.7–4.0)
MCH: 28.2 pg (ref 26.0–34.0)
MCHC: 33.6 g/dL (ref 30.0–36.0)
MCV: 83.7 fL (ref 78.0–100.0)
MONO ABS: 1 10*3/uL (ref 0.1–1.0)
Monocytes Relative: 10 %
NEUTROS ABS: 7.4 10*3/uL (ref 1.7–7.7)
NEUTROS PCT: 71 %
Platelets: 277 10*3/uL (ref 150–400)
RBC: 4.12 MIL/uL (ref 3.87–5.11)
RDW: 12.6 % (ref 11.5–15.5)
WBC: 10.4 10*3/uL (ref 4.0–10.5)

## 2016-05-22 LAB — I-STAT CHEM 8, ED
BUN: 12 mg/dL (ref 6–20)
CREATININE: 0.7 mg/dL (ref 0.44–1.00)
Calcium, Ion: 1.15 mmol/L (ref 1.15–1.40)
Chloride: 104 mmol/L (ref 101–111)
GLUCOSE: 105 mg/dL — AB (ref 65–99)
HCT: 34 % — ABNORMAL LOW (ref 36.0–46.0)
HEMOGLOBIN: 11.6 g/dL — AB (ref 12.0–15.0)
POTASSIUM: 3.8 mmol/L (ref 3.5–5.1)
Sodium: 140 mmol/L (ref 135–145)
TCO2: 26 mmol/L (ref 0–100)

## 2016-05-22 LAB — RAPID STREP SCREEN (MED CTR MEBANE ONLY): Streptococcus, Group A Screen (Direct): NEGATIVE

## 2016-05-22 LAB — I-STAT BETA HCG BLOOD, ED (MC, WL, AP ONLY): I-stat hCG, quantitative: <5 m[IU]/mL (ref <5)

## 2016-05-22 MED ORDER — DEXAMETHASONE SODIUM PHOSPHATE 10 MG/ML IJ SOLN
10.0000 mg | Freq: Once | INTRAMUSCULAR | Status: AC
  Administered 2016-05-22: 10 mg via INTRAVENOUS
  Filled 2016-05-22: qty 1

## 2016-05-22 MED ORDER — HYDROMORPHONE HCL 2 MG/ML IJ SOLN
0.5000 mg | Freq: Once | INTRAMUSCULAR | Status: AC
  Administered 2016-05-22: 0.5 mg via INTRAVENOUS
  Filled 2016-05-22: qty 1

## 2016-05-22 MED ORDER — SODIUM CHLORIDE 0.9 % IV BOLUS (SEPSIS)
1000.0000 mL | Freq: Once | INTRAVENOUS | Status: AC
  Administered 2016-05-22: 1000 mL via INTRAVENOUS

## 2016-05-22 MED ORDER — CLINDAMYCIN PHOSPHATE 600 MG/50ML IV SOLN
600.0000 mg | Freq: Once | INTRAVENOUS | Status: AC
  Administered 2016-05-22: 600 mg via INTRAVENOUS
  Filled 2016-05-22: qty 50

## 2016-05-22 MED ORDER — LIDOCAINE HCL (PF) 1 % IJ SOLN
INTRAMUSCULAR | Status: AC
  Filled 2016-05-22: qty 5

## 2016-05-22 MED ORDER — KETOROLAC TROMETHAMINE 30 MG/ML IJ SOLN
15.0000 mg | Freq: Once | INTRAMUSCULAR | Status: AC
  Administered 2016-05-22: 15 mg via INTRAVENOUS
  Filled 2016-05-22: qty 1

## 2016-05-22 MED ORDER — IOPAMIDOL (ISOVUE-300) INJECTION 61%
INTRAVENOUS | Status: AC
  Administered 2016-05-22: 75 mL
  Filled 2016-05-22: qty 75

## 2016-05-22 MED ORDER — HYDROCODONE-ACETAMINOPHEN 7.5-325 MG/15ML PO SOLN: 10.0000 mL | ORAL | 0 refills | Status: DC | PRN

## 2016-05-22 NOTE — ED Triage Notes (Signed)
Pt endorses waking up with sore throat on Christmas Morning, pt went to pcp on Tuesday and receive z-pack and tested positive for strep. Then pt went to [facility] Thursday morning and received IV steroids and fluids, then pt received ENT follow up and went there Thursday afternoon, pt was tested there for abscess and negative. Pt reports waking up with difficulty swallowing and swelling in throat. Airway intact, breath sounds clear. Pt afebrile in triage.

## 2016-05-22 NOTE — Discharge Instructions (Signed)
It was our pleasure to provide your ER care today - we hope that you feel better.  Drink plenty of fluids.  Complete the course of your antibiotic.  Take tylenol and/or motrin as need for pain.  Follow up with ENT doctor in 2 weeks - call office to arrange appointment.  You were given pain medication in the ER - no driving for the next 4 hours.   Return to ER if worse, unable to swallow, difficulty breathing, other concern.

## 2016-05-22 NOTE — Consult Note (Signed)
ENT CONSULT:  Reason for Consult: Acute tonsillitis and peritonsillar abscess Referring Physician: EDP  Tanya Sanders is an 30 y.o. female.  HPI: The patient presents for evaluation of progressive symptoms of sore throat, odynophagia and trismus. She was seen in the [facility] emergency department and diagnosed with acute tonsillitis, treated with Zithromax and clindamycin. She was seen by Dr. Constance Holster on 05/20/16 and needle aspiration of possible right peritonsillar abscess was attempted without success. The patient has had progressive symptoms of sore throat, odynophagia and trismus. CT scan of the neck shows a 3.5 cm soft tissue density in the right peritonsillar fossa consistent with abscess.  Past Medical History:  Diagnosis Date  . Abscess of tonsil     Past Surgical History:  Procedure Laterality Date  . CESAREAN SECTION    . CESAREAN SECTION N/A 01/31/2014   Procedure: Repeat CESAREAN SECTION;  Surgeon: Princess Bruins, MD;  Location: [place] [facility];  Service: Obstetrics;  Laterality: N/A;  EDD: 02/06/14  . CHOLECYSTECTOMY N/A 09/22/2012   Procedure: LAPAROSCOPIC CHOLECYSTECTOMY;  Surgeon: Scherry Ran, MD;  Location: [facility];  Service: General;  Laterality: N/A;  . ERCP N/A 10/06/2012   Procedure: ENDOSCOPIC RETROGRADE CHOLANGIOPANCREATOGRAPHY (ERCP);  Surgeon: Daneil Dolin, MD;  Location: [facility];  Service: Gastroenterology;  Laterality: N/A;  . REMOVAL OF STONES N/A 10/06/2012   Procedure: REMOVAL OF STONES;  Surgeon: Daneil Dolin, MD;  Location: [facility];  Service: Gastroenterology;  Laterality: N/A;  . SPHINCTEROTOMY N/A 10/06/2012   Procedure: SPHINCTEROTOMY;  Surgeon: Daneil Dolin, MD;  Location: [facility];  Service: Gastroenterology;  Laterality: N/A;    History reviewed. No pertinent family history.  Social History:  reports that she quit smoking about 3 years ago. Her smoking use included Cigarettes. She started smoking about 14 years ago. She has a 11.00 pack-year smoking  history. She has never used smokeless tobacco. She reports that she does not drink alcohol or use drugs.  Allergies:  Allergies  Allergen Reactions  . Bactrim [Sulfamethoxazole-Trimethoprim] Rash  . Penicillins Rash  . Sulfa Antibiotics Rash    Medications: I have reviewed the patient's current medications.  Results for orders placed or performed during the hospital encounter of 05/22/16 (from the past 48 hour(s))  Rapid strep screen     Status: None   Collection Time: 05/22/16  2:23 AM  Result Value Ref Range   Streptococcus, Group A Screen (Direct) NEGATIVE NEGATIVE    Comment: (NOTE) A Rapid Antigen test may result negative if the antigen level in the sample is below the detection level of this test. The FDA has not cleared this test as a stand-alone test therefore the rapid antigen negative result has reflexed to a Group A Strep culture.   CBC with Differential/Platelet     Status: Abnormal   Collection Time: 05/22/16  6:21 AM  Result Value Ref Range   WBC 10.4 4.0 - 10.5 K/uL   RBC 4.12 3.87 - 5.11 MIL/uL   Hemoglobin 11.6 (L) 12.0 - 15.0 g/dL   HCT 34.5 (L) 36.0 - 46.0 %   MCV 83.7 78.0 - 100.0 fL   MCH 28.2 26.0 - 34.0 pg   MCHC 33.6 30.0 - 36.0 g/dL   RDW 12.6 11.5 - 15.5 %   Platelets 277 150 - 400 K/uL   Neutrophils Relative % 71 %   Neutro Abs 7.4 1.7 - 7.7 K/uL   Lymphocytes Relative 19 %   Lymphs Abs 1.9 0.7 - 4.0 K/uL  Monocytes Relative 10 %   Monocytes Absolute 1.0 0.1 - 1.0 K/uL   Eosinophils Relative 0 %   Eosinophils Absolute 0.0 0.0 - 0.7 K/uL   Basophils Relative 0 %   Basophils Absolute 0.0 0.0 - 0.1 K/uL  I-stat chem 8, ed     Status: Abnormal   Collection Time: 05/22/16  6:29 AM  Result Value Ref Range   Sodium 140 135 - 145 mmol/L   Potassium 3.8 3.5 - 5.1 mmol/L   Chloride 104 101 - 111 mmol/L   BUN 12 6 - 20 mg/dL   Creatinine, Ser 0.70 0.44 - 1.00 mg/dL   Glucose, Bld 105 (H) 65 - 99 mg/dL   Calcium, Ion 1.15 1.15 - 1.40 mmol/L    TCO2 26 0 - 100 mmol/L   Hemoglobin 11.6 (L) 12.0 - 15.0 g/dL   HCT 34.0 (L) 36.0 - 46.0 %  I-Stat Beta hCG blood, ED ([facility], [facility], [facility] only)     Status: None   Collection Time: 05/22/16  6:29 AM  Result Value Ref Range   I-stat hCG, quantitative <5.0 <5 mIU/mL   Comment 3            Comment:   GEST. AGE      CONC.  (mIU/mL)   <=1 WEEK        5 - 50     2 WEEKS       50 - 500     3 WEEKS       100 - 10,000     4 WEEKS     1,000 - 30,000        FEMALE AND NON-PREGNANT FEMALE:     LESS THAN 5 mIU/mL     Ct Soft Tissue Neck W Contrast  Addendum Date: 05/22/2016   ADDENDUM REPORT: 05/22/2016 07:51 ADDENDUM: Study discussed by telephone with Dr. Varney Biles on 05/22/2016 at 0747 hours. Electronically Signed   By: Genevie Ann M.D.   On: 05/22/2016 07:51   Result Date: 05/22/2016 CLINICAL DATA:  30 year old female with sore throat for 5-6 days. Started antibiotics 4 days ago without relief. Right side pain and difficulty swallowing. Initial encounter. EXAM: CT NECK WITH CONTRAST TECHNIQUE: Multidetector CT imaging of the neck was performed using the standard protocol following the bolus administration of intravenous contrast. CONTRAST:  47mL ISOVUE-300 IOPAMIDOL (ISOVUE-300) INJECTION 61% COMPARISON:  None. FINDINGS: Pharynx and larynx: Generalized edema throughout the right parapharyngeal space and affecting the right lateral wall of the pharynx. Moderate to severe enlargement of the soft tissues of the palatine tonsil within underlying large oval shaped 36 x 27 by 38 mm (AP by transverse by CC) low-density mass occupying the right tonsil. This has intermediate densitometry (39 Hounsfield units). Mild effacement of the pharynx, no severe airway narrowing. The soft palate and uvula also appear edematous. The left palatine tonsil and adenoids have a more normal appearance. The lingual tonsil is hypertrophied but otherwise normal. There is asymmetry Of the larynx suggesting right vocal cord paralysis. There  is a small retropharyngeal effusion, which also tracks laterally to the right carotid space. Salivary glands: Secondary inflammation in the right submandibular space, but the right submandibular gland appears normal. The sublingual space is spared. The left submandibular gland and bilateral parotid glands are within normal limits. Thyroid: Negative aside from small volume of adjacent fluid on the right. Lymph nodes: Right level 2 lymphadenopathy, with rounded enlarged lymph nodes measuring up to 14 mm short axis individually. Lesser right  level 1 B lymphadenopathy, with nodes measuring up to 10 mm short axis. Mild right level 3 lymphadenopathy with subcentimeter nodes. No cystic or necrotic nodes. The largest contralateral nodes in the left neck are at level 2 measuring 9-10 mm short axis. Vascular: Major vascular structures in the neck and at the skullbase are patent, including the right carotid and right internal jugular vein. Limited intracranial: Negative. Visualized orbits: Not included. Mastoids and visualized paranasal sinuses: Clear. Leftward nasal septal deviation. Skeleton: No acute dental abnormality identified. The mandible is intact. There is mild reversal of cervical lordosis, but no osseous changes in the cervical spine. No acute osseous abnormality identified. Upper chest: The superior mediastinum is normal. Normal visualized lungs. IMPRESSION: 1. Large intra-tonsillar abscess on the right measuring up to 3.8 cm (estimated abscess volume 18 mL), with moderate to severe right lateral pharyngeal wall, soft palate, and parapharyngeal space edema. 2. No other drainable fluid collection, but there is a small retropharyngeal effusion eccentric to the right with associated edema throughout the right submandibular and carotid spaces. No associated vascular abnormality, the right IJ is patent. 3. Right level 1B through level 3 lymphadenopathy. The largest nodes are at the right level 2 station measuring up to  14 mm short axis individually. No cystic or necrotic nodes. 4. Recommend ENT consultation. Electronically Signed: By: Genevie Ann M.D. On: 05/22/2016 07:45    ROS:ROS  Blood pressure 129/87, pulse 95, temperature 99.6 F (37.6 C), temperature source Oral, resp. rate 18, last menstrual period 05/19/2016, SpO2 98 %.  PHYSICAL EXAM: General appearance - alert, well appearing, and in no distress and ill-appearing Mouth - mucous membranes moist, pharynx normal without lesions and Acute tonsillitis with significant right parapharyngeal swelling and lateralization of the uvula and palate. Neck - bilateral symmetric anterior adenopathy  Procedure: Incision and Drainage right Peritonsillar Abscess  Risks and benefits of incision and drainage of right peritonsillar abscess were discussed in detail with the patient and his family. They understand and agree with the procedure.  Patient treated with topical anesthetic spray. Patient injected with 1 mL of 1% lidocaine with epinephrine in the right peritonsillar fossa. After allowing adequate time for vasoconstriction and anesthesia a 1 cm curvilinear incision was made in the right peritonsillar fossa. 15 mL of mucopurulent material was expressed. Tonsil hemostat was used to carefully divide loculations and ensure adequate drainage. Minimal bleeding.  Patient tolerated the procedure without complication or difficulty.  Delsa Bern M.D.  Studies Reviewed: CT scan of the neck  Assessment/Plan: Patient presents with progressive symptoms of acute tonsillitis, findings on physical examination and CT scan consistent with right peritonsillar abscess. She underwent incision and drainage for a significant amount of purulent material with immediate improvement in pain and trismus. Recommend discharge on current clindamycin, patient prescribed Hycet elixir. Recommend tonsil precautions as outlined below. Follow-up in our office in 2 weeks for recheck or sooner as  needed.  Tonsil Precautions:  1. Increase by mouth fluids (4 L per day). 2. Liquid and soft oral diet as tolerated. 3. Warm salt water gargle as tolerated. 4. Medications as prescribed. 5. Limited physical activity, no lifting or straining. 6. Expect continued improving sore throat, difficulty swallowing and ear pain. 7. Plan follow-up in 2 weeks for recheck. 8. Return if symptoms worsen over the next 48 hours.  Spurgeon, Shemeika Starzyk 05/22/2016, 9:51 AM

## 2016-05-22 NOTE — ED Provider Notes (Signed)
[facility] DEPT Provider Note   CSN: [id number] Arrival date & time: 05/22/16  0211     History   Chief Complaint Chief Complaint  Patient presents with  . Sore Throat    HPI Tanya Sanders is a 30 y.o. female.  The history is provided by the patient.  Sore Throat  This is a new problem. The current episode started more than 2 days ago. The problem occurs constantly. The problem has been gradually worsening. Pertinent negatives include no chest pain and no shortness of breath.   Pt give Zpack by PCP. Got worse, seen in the ER and then by ENT. Started on [place]. Aspiration in the clinic didn't yield any pus. Overnight, pt's symptoms got worse, and she has essentially lost her voice. Pt is not having difficulty breathing.  Past Medical History:  Diagnosis Date  . Abscess of tonsil     Patient Active Problem List   Diagnosis Date Noted  . Polycystic ovarian syndrome 08/12/2014  . Morbid obesity ([place]) 08/12/2014  . Postoperative state 01/31/2014  . Postpartum care following cesarean delivery (9/10) 01/31/2014    Past Surgical History:  Procedure Laterality Date  . CESAREAN SECTION    . CESAREAN SECTION N/A 01/31/2014   Procedure: Repeat CESAREAN SECTION;  Surgeon: Princess Bruins, MD;  Location: [facility];  Service: Obstetrics;  Laterality: N/A;  EDD: 02/06/14  . CHOLECYSTECTOMY N/A 09/22/2012   Procedure: LAPAROSCOPIC CHOLECYSTECTOMY;  Surgeon: Scherry Ran, MD;  Location: [facility];  Service: General;  Laterality: N/A;  . ERCP N/A 10/06/2012   Procedure: ENDOSCOPIC RETROGRADE CHOLANGIOPANCREATOGRAPHY (ERCP);  Surgeon: Daneil Dolin, MD;  Location: [facility];  Service: Gastroenterology;  Laterality: N/A;  . REMOVAL OF STONES N/A 10/06/2012   Procedure: REMOVAL OF STONES;  Surgeon: Daneil Dolin, MD;  Location: [facility];  Service: Gastroenterology;  Laterality: N/A;  . SPHINCTEROTOMY N/A 10/06/2012   Procedure: SPHINCTEROTOMY;  Surgeon: Daneil Dolin, MD;  Location: [facility];  Service: Gastroenterology;  Laterality: N/A;    OB History    Gravida Para Term Preterm AB Living   2 2 1     2    SAB TAB Ectopic Multiple Live Births           1       Home Medications    Prior to Admission medications   Medication Sig Start Date End Date Taking? Authorizing Provider  clindamycin (CLEOCIN) 300 MG capsule Take 300 mg by mouth 3 (three) times daily. 05/20/16  Yes Historical Provider, MD  Ronnette Juniper 1.5/30 1.5-30 MG-MCG tablet Take 1 tablet by mouth daily. 03/29/16  Yes Historical Provider, MD  oxyCODONE-acetaminophen (PERCOCET) 5-325 MG tablet Take 1-2 tablets by mouth every 4 (four) hours as needed. 05/20/16  Yes Nat Christen, MD  azithromycin (ZITHROMAX Z-PAK) 250 MG tablet Take 2 tablets (500 mg) on  Day 1,  followed by 1 tablet (250 mg) once daily on Days 2 through 5. Patient not taking: Reported on 05/22/2016 05/18/16   Kathyrn Drown, MD  azithromycin (ZITHROMAX) 250 MG tablet 2 tabs daily for 5 days Patient not taking: Reported on 05/22/2016 08/03/15   Janne Napoleon, NP  gentamicin (GARAMYCIN) 0.3 % ophthalmic solution Take 2 drops QID to affected eye for five days Patient not taking: Reported on 05/22/2016 04/30/15   Mikey Kirschner, MD  ondansetron (ZOFRAN) 8 MG tablet Take 1 tablet (8 mg total) by mouth every 4 (four) hours as needed. Patient not taking: Reported on 05/22/2016 05/20/16  Nat Christen, MD  phentermine 37.5 MG capsule Take 1 capsule (37.5 mg total) by mouth every morning. Patient not taking: Reported on 05/22/2016 11/13/14   Mikey Kirschner, MD  zolpidem (AMBIEN) 5 MG tablet Take 1 tablet (5 mg total) by mouth at bedtime as needed for sleep. Patient not taking: Reported on 05/22/2016 05/20/16   Nat Christen, MD    Family History History reviewed. No pertinent family history.  Social History Social History  Substance Use Topics  . Smoking status: Former Smoker    Packs/day: 1.00    Years: 11.00    Types: Cigarettes    Start date: 10/26/2001     Quit date: 08/22/2012  . Smokeless tobacco: Never Used  . Alcohol use No     Allergies   Bactrim [sulfamethoxazole-trimethoprim]; Penicillins; and Sulfa antibiotics   Review of Systems Review of Systems  Respiratory: Negative for shortness of breath.   Cardiovascular: Negative for chest pain.   ROS 10 Systems reviewed and are negative for acute change except as noted in the HPI.      Physical Exam Updated Vital Signs BP 122/83   Pulse 98   Temp 99.6 F (37.6 C) (Oral)   Resp 18   LMP 05/19/2016 (Exact Date)   SpO2 99%   Physical Exam  Constitutional: She is oriented to person, place, and time. She appears well-developed and well-nourished.  HENT:  Head: Normocephalic and atraumatic.  Mouth/Throat: Oropharyngeal exudate present.  R sided tonsillar enlargement with exudates  Eyes: EOM are normal. Pupils are equal, round, and reactive to light.  Neck: Normal range of motion. Neck supple. No tracheal deviation present.  No trismus  Cardiovascular: Normal rate, regular rhythm and normal heart sounds.   No murmur heard. Pulmonary/Chest: Effort normal. No stridor. No respiratory distress.  Abdominal: Soft. She exhibits no distension. There is no tenderness. There is no rebound and no guarding.  Lymphadenopathy:    She has cervical adenopathy.  Neurological: She is alert and oriented to person, place, and time.  Skin: Skin is warm and dry.  Nursing note and vitals reviewed.    ED Treatments / Results  Labs (all labs ordered are listed, but only abnormal results are displayed) Labs Reviewed  CBC WITH DIFFERENTIAL/PLATELET - Abnormal; Notable for the following:       Result Value   Hemoglobin 11.6 (*)    HCT 34.5 (*)    All other components within normal limits  I-STAT CHEM 8, ED - Abnormal; Notable for the following:    Glucose, Bld 105 (*)    Hemoglobin 11.6 (*)    HCT 34.0 (*)    All other components within normal limits  RAPID STREP SCREEN (NOT AT [facility])    CULTURE, GROUP A STREP ([place])  I-STAT BETA HCG BLOOD, ED ([facility], [facility], [facility] ONLY)    EKG  EKG Interpretation None       Radiology Ct Soft Tissue Neck W Contrast  Addendum Date: 05/22/2016   ADDENDUM REPORT: 05/22/2016 07:51 ADDENDUM: Study discussed by telephone with Dr. Varney Biles on 05/22/2016 at 0747 hours. Electronically Signed   By: Genevie Ann M.D.   On: 05/22/2016 07:51   Result Date: 05/22/2016 CLINICAL DATA:  30 year old female with sore throat for 5-6 days. Started antibiotics 4 days ago without relief. Right side pain and difficulty swallowing. Initial encounter. EXAM: CT NECK WITH CONTRAST TECHNIQUE: Multidetector CT imaging of the neck was performed using the standard protocol following the bolus administration of intravenous contrast. CONTRAST:  8mL ISOVUE-300 IOPAMIDOL (ISOVUE-300) INJECTION 61% COMPARISON:  None. FINDINGS: Pharynx and larynx: Generalized edema throughout the right parapharyngeal space and affecting the right lateral wall of the pharynx. Moderate to severe enlargement of the soft tissues of the palatine tonsil within underlying large oval shaped 36 x 27 by 38 mm (AP by transverse by CC) low-density mass occupying the right tonsil. This has intermediate densitometry (39 Hounsfield units). Mild effacement of the pharynx, no severe airway narrowing. The soft palate and uvula also appear edematous. The left palatine tonsil and adenoids have a more normal appearance. The lingual tonsil is hypertrophied but otherwise normal. There is asymmetry Of the larynx suggesting right vocal cord paralysis. There is a small retropharyngeal effusion, which also tracks laterally to the right carotid space. Salivary glands: Secondary inflammation in the right submandibular space, but the right submandibular gland appears normal. The sublingual space is spared. The left submandibular gland and bilateral parotid glands are within normal limits. Thyroid: Negative aside from small volume of  adjacent fluid on the right. Lymph nodes: Right level 2 lymphadenopathy, with rounded enlarged lymph nodes measuring up to 14 mm short axis individually. Lesser right level 1 B lymphadenopathy, with nodes measuring up to 10 mm short axis. Mild right level 3 lymphadenopathy with subcentimeter nodes. No cystic or necrotic nodes. The largest contralateral nodes in the left neck are at level 2 measuring 9-10 mm short axis. Vascular: Major vascular structures in the neck and at the skullbase are patent, including the right carotid and right internal jugular vein. Limited intracranial: Negative. Visualized orbits: Not included. Mastoids and visualized paranasal sinuses: Clear. Leftward nasal septal deviation. Skeleton: No acute dental abnormality identified. The mandible is intact. There is mild reversal of cervical lordosis, but no osseous changes in the cervical spine. No acute osseous abnormality identified. Upper chest: The superior mediastinum is normal. Normal visualized lungs. IMPRESSION: 1. Large intra-tonsillar abscess on the right measuring up to 3.8 cm (estimated abscess volume 18 mL), with moderate to severe right lateral pharyngeal wall, soft palate, and parapharyngeal space edema. 2. No other drainable fluid collection, but there is a small retropharyngeal effusion eccentric to the right with associated edema throughout the right submandibular and carotid spaces. No associated vascular abnormality, the right IJ is patent. 3. Right level 1B through level 3 lymphadenopathy. The largest nodes are at the right level 2 station measuring up to 14 mm short axis individually. No cystic or necrotic nodes. 4. Recommend ENT consultation. Electronically Signed: By: Genevie Ann M.D. On: 05/22/2016 07:45    Procedures Procedures (including critical care time)  Medications Ordered in ED Medications  sodium chloride 0.9 % bolus 1,000 mL (0 mLs Intravenous Stopped 05/22/16 0746)  ketorolac (TORADOL) 30 MG/ML injection  15 mg (15 mg Intravenous Given 05/22/16 [id number])  dexamethasone (DECADRON) injection 10 mg (10 mg Intravenous Given 05/22/16 [id number])  sodium chloride 0.9 % bolus 1,000 mL (0 mLs Intravenous Stopped 05/22/16 0732)  iopamidol (ISOVUE-300) 61 % injection (75 mLs  Contrast Given 05/22/16 0703)     Initial Impression / Assessment and Plan / ED Course  I have reviewed the triage vital signs and the nursing notes.  Pertinent labs & imaging results that were available during my care of the patient were reviewed by me and considered in my medical decision making (see chart for details).  Clinical Course as of May 22 810  Sat May 22, 2016  [id number] Results from the ER workup discussed with the patient face to face and  all questions answered to the best of my ability. ENT evaluation pending. We will give her iv clinda. CT Soft Tissue Neck W Contrast [AN]    Clinical Course User Index [AN] Varney Biles, MD    Pt with worsening sore throat despite antibiotics. CT soft tissue ordered, as clinic visit didn't yield abscess and despite antibiotics, pt has worsened. Appears to be peritonsillar and tonsillar abscess. ENT consulted -and they will see the patient.   Final Clinical Impressions(s) / ED Diagnoses   Final diagnoses:  Tonsillar abscess    New Prescriptions New Prescriptions   No medications on file     Varney Biles, MD 05/22/16 [phone]

## 2016-05-24 LAB — CULTURE, GROUP A STREP (THRC)

## 2016-05-25 ENCOUNTER — Encounter: Payer: Self-pay | Admitting: Family Medicine

## 2016-05-25 ENCOUNTER — Telehealth: Payer: Self-pay

## 2016-05-25 NOTE — Telephone Encounter (Signed)
Tanya Sanders to speak with, fill out form for info so we may do the fmla, charge pt, We can handle this for pt  ----- Message -----  From: Jesusita Oka, LPN  Sent: [id number] AM  To: Mikey Kirschner, MD  Subject: FW: Visit Follow-Up Question                 ----- Message -----  From: [name]  Sent: 05/25/2016 10:30 AM  To: [facility] Clinical Pool  Subject: Visit Follow-Up Question               I was seen last Tuesday by Dr. Richardson Landry, tested positive for strep, went home on z-pack - didn't get better. ended up in [facility] ED on Thursday with ? abcess was treated in ED and sent to ENT office that same day, saw Dr. Constance Holster who did aspiration and did not get anything back so he did not think I had abscess. He changed my abx and sent me home. Friday night/Sat Morning ended up back in the [facility] ED with peritonsillar abscess - was seen by Dr. Wilburn Cornelia (ENT) and had I &D. I'm continuing to improved but was told that unless things get worse or unless I have another instance of this in the future. I do not have to follow back up with them. I'm was out of work most of last week and will be most of this week. I'm needing to start an FMLA claim but need to know which physician to have sign off on it. Since your my PCP, I saw you first, and will probably follow up with you. I'm thinking I need to have them send it to you. Is that okay?

## 2016-05-26 DIAGNOSIS — Z029 Encounter for administrative examinations, unspecified: Secondary | ICD-10-CM

## 2016-06-01 ENCOUNTER — Telehealth: Payer: Self-pay | Admitting: Pediatrics

## 2016-06-01 ENCOUNTER — Encounter: Payer: Self-pay | Admitting: Family Medicine

## 2016-06-01 ENCOUNTER — Ambulatory Visit (INDEPENDENT_AMBULATORY_CARE_PROVIDER_SITE_OTHER): Payer: 59 | Admitting: Family Medicine

## 2016-06-01 VITALS — BP 112/76 | Temp 98.6°F | Ht 66.0 in | Wt 197.4 lb

## 2016-06-01 DIAGNOSIS — J36 Peritonsillar abscess: Secondary | ICD-10-CM | POA: Diagnosis not present

## 2016-06-01 MED ORDER — CLINDAMYCIN HCL 300 MG PO CAPS: 300.0000 mg | ORAL_CAPSULE | Freq: Three times a day (TID) | ORAL | 0 refills | Status: DC

## 2016-06-01 MED ORDER — DEXAMETHASONE SODIUM PHOSPHATE 4 MG/ML IJ SOLN
4.0000 mg | Freq: Once | INTRAMUSCULAR | Status: AC
  Administered 2016-06-01: 4 mg via INTRAMUSCULAR

## 2016-06-01 NOTE — Telephone Encounter (Signed)
Patient is calling stating tonsillar abscess is getting worse. She states she finished ATBs last night. She denies reps distress a this point. Per Dr. Richardson Landry will see patient today at 1630.  I advised to go to ED if resp distress occurs or pain becomes worse. She voiced understanding and agreed with plan.

## 2016-06-01 NOTE — Progress Notes (Signed)
   Subjective:    Patient ID: Tanya Sanders, female    DOB: 07-05-1985, 31 y.o.   MRN: [id number]  HPI Patient arrives with history of tonsillar abscess that was lanced last week in ED. Patient finished antibiotic yesterday and woke up this morning and the abscess has returned. Testing all notes from specialist reviewed. All notes from emergency room reviewed.   Had tonsillar absscess , initially drained with a needle, then with incision.  The last several days patient has had a recurrence of symptomatology. More throat pain. More noticeable swelling. No true trouble breathing. No high fever or chills. Positive diminished appetite  Review of Systems No headache or chest pain    Objective:   Physical Exam  Alert vitals stable, NAD. Blood pressure good on repeat. HEENT erythematous throat right tonsillar swelling not touching uvula, yet, tender anterior cervical nodes. Voice has hot potato sounding pronunciation. TMs otherwise normal. Lungs clear. Heart regular rate and rhythm.       Assessment & Plan:  Impression probable reemergence of tonsillar abscess discussed. Has ENT visit tomorrow. I've advised patient likely will need surgery. Antibiotics refilled. Warning signs discussed carefully

## 2016-06-02 ENCOUNTER — Ambulatory Visit: Payer: Self-pay | Admitting: Otolaryngology

## 2016-06-02 ENCOUNTER — Encounter (HOSPITAL_COMMUNITY): Payer: Self-pay | Admitting: *Deleted

## 2016-06-02 DIAGNOSIS — Z029 Encounter for administrative examinations, unspecified: Secondary | ICD-10-CM

## 2016-06-02 DIAGNOSIS — J36 Peritonsillar abscess: Secondary | ICD-10-CM | POA: Diagnosis not present

## 2016-06-02 DIAGNOSIS — J0301 Acute recurrent streptococcal tonsillitis: Secondary | ICD-10-CM | POA: Diagnosis not present

## 2016-06-02 NOTE — H&P (Signed)
  HPI:    Tanya Sanders is a 31 y.o. female who presents as a return  Patient.   Referring Provider: Referral, Self   Chief complaint: Sore throat.  HPI: A couple of days after I saw her last, and had a negative aspirate, she got much worse and went to emergency department.  She was found to have an abscess at that point which was incised and drained effectively.  She felt much better but never completely cleared.  She completed her antibiotic on Monday and by yesterday was having severe pain again.  She did get a shot of steroid and antibiotic yesterday.  She has a history of peritonsillar abscess as a teenager as well.  Otherwise in good health.  PMH/Meds/All/SocHx/FamHx/ROS:   Past Medical History  History reviewed. No pertinent past medical history.    Past Surgical History       Past Surgical History:  Procedure Laterality Date  . CESAREAN SECTION    . CHOLECYSTECTOMY        No family history of bleeding disorders, wound healing problems or difficulty with anesthesia.   Social History  Social History        Social History  . Marital status: Single    Spouse name: N/A  . Number of children: N/A  . Years of education: N/A      Occupational History  . Not on file.       Social History Main Topics  . Smoking status: Never Smoker  . Smokeless tobacco: Never Used  . Alcohol use Yes  . Drug use: No  . Sexual activity: Not on file       Other Topics Concern  . Not on file      Social History Narrative  . No narrative on file       Current Outpatient Prescriptions:  .  clindamycin (CLEOCIN) 300 MG capsule, Take by mouth., Disp: , Rfl:  .  LARIN 1.5/30, 21, 1.5-30 mg-mcg Tab, , Disp: , Rfl: 4  A complete ROS was performed with pertinent positives/negatives noted in the HPI. The remainder of the ROS are negative.    Physical Exam:    There were no vitals taken for this visit.   General:  Healthy and alert, in no  distress, breathing easily. Normal affect. In a pleasant mood. Head: Normocephalic, atraumatic. No masses, or scars. Eyes: Pupils are equal, and reactive to light. Vision is grossly intact. No spontaneous or gaze nystagmus. Ears: Ear canals are clear. Tympanic membranes are intact, with normal landmarks and the middle ears are clear and healthy. Hearing: Grossly normal. Nose: Nasal cavities are clear with healthy mucosa, no polyps or exudate.Airways are patent. Face: No masses or scars, facial nerve function is symmetric. Oral Cavity: No mucosal abnormalities are noted. Tongue with normal mobility. Dentition appears healthy. Oropharynx: Tonsils are asymmetrically enlarged on the right without obvious erythema of the soft palate mucosa. There are no mucosal masses identified. Tongue base appears normal and healthy. Larynx/Hypopharynx: deferred Chest: Deferred Neck: No palpable masses, no cervical adenopathy, no thyroid nodules or enlargement. Neuro: Cranial nerves II-XII will normal function. Balance: Normal gate. Other findings: none.   Independent Review of Additional Tests or Records:  none  Procedures:  none   Impression & Plans:  Recurrent peritonsillar abscess.  At this point, my recommendation is for urgent tonsillectomy.[place] meets the indications for tonsillectomy. Risks and benefits were discussed in detail. All questions were answered. A handout was provided with additional details.

## 2016-06-02 NOTE — Progress Notes (Signed)
Spoke with pt for pre-op call. Pt denies cardiac history, chest pain or sob. 

## 2016-06-02 NOTE — Anesthesia Preprocedure Evaluation (Addendum)
Anesthesia Evaluation  Patient identified by MRN, date of birth, ID band Patient awake    Reviewed: Allergy & Precautions, H&P , NPO status , Patient's Chart, lab work & pertinent test results  Airway Mallampati: I  TM Distance: >3 FB Neck ROM: full    Dental no notable dental hx. (+) Teeth Intact   Pulmonary neg pulmonary ROS, former smoker,    Pulmonary exam normal        Cardiovascular negative cardio ROS Normal cardiovascular exam     Neuro/Psych negative neurological ROS  negative psych ROS   GI/Hepatic negative GI ROS, Neg liver ROS,   Endo/Other  negative endocrine ROS  Renal/GU negative Renal ROS     Musculoskeletal negative musculoskeletal ROS (+)   Abdominal (+) + obese,   Peds  Hematology negative hematology ROS (+)   Anesthesia Other Findings   Reproductive/Obstetrics negative OB ROS                            Anesthesia Physical Anesthesia Plan  ASA: II  Anesthesia Plan: General   Post-op Pain Management:    Induction: Intravenous  Airway Management Planned: Oral ETT  Additional Equipment:   Intra-op Plan:   Post-operative Plan: Extubation in OR  Informed Consent: I have reviewed the patients History and Physical, chart, labs and discussed the procedure including the risks, benefits and alternatives for the proposed anesthesia with the patient or authorized representative who has indicated his/her understanding and acceptance.   Dental Advisory Given  Plan Discussed with: CRNA and Surgeon  Anesthesia Plan Comments:        Anesthesia Quick Evaluation

## 2016-06-03 ENCOUNTER — Encounter (HOSPITAL_COMMUNITY): Payer: Self-pay | Admitting: *Deleted

## 2016-06-03 ENCOUNTER — Encounter (HOSPITAL_COMMUNITY)
Admission: RE | Disposition: A | Discharge status: Home / Self Care | Payer: Self-pay | Source: Ambulatory Visit | Attending: Otolaryngology

## 2016-06-03 ENCOUNTER — Ambulatory Visit (HOSPITAL_COMMUNITY)
Admission: RE | Admit: 2016-06-03 | Discharge: 2016-06-03 | Disposition: A | Discharge status: Home / Self Care | Payer: 59 | Source: Ambulatory Visit | Attending: Otolaryngology | Admitting: Otolaryngology

## 2016-06-03 ENCOUNTER — Ambulatory Visit (HOSPITAL_COMMUNITY): Payer: 59 | Admitting: Certified Registered Nurse Anesthetist

## 2016-06-03 DIAGNOSIS — J0301 Acute recurrent streptococcal tonsillitis: Secondary | ICD-10-CM | POA: Diagnosis not present

## 2016-06-03 DIAGNOSIS — J36 Peritonsillar abscess: Secondary | ICD-10-CM | POA: Insufficient documentation

## 2016-06-03 DIAGNOSIS — Z87891 Personal history of nicotine dependence: Secondary | ICD-10-CM | POA: Insufficient documentation

## 2016-06-03 DIAGNOSIS — J3501 Chronic tonsillitis: Secondary | ICD-10-CM | POA: Diagnosis not present

## 2016-06-03 HISTORY — PX: TONSILLECTOMY: SHX5217

## 2016-06-03 SURGERY — TONSILLECTOMY
Anesthesia: General | Site: Throat

## 2016-06-03 MED ORDER — HYDROCODONE-ACETAMINOPHEN 7.5-325 MG/15ML PO SOLN
ORAL | Status: AC
  Filled 2016-06-03: qty 15

## 2016-06-03 MED ORDER — ACETAMINOPHEN 10 MG/ML IV SOLN
INTRAVENOUS | Status: AC
  Filled 2016-06-03: qty 100

## 2016-06-03 MED ORDER — ATROPINE SULFATE 1 MG/ML IJ SOLN
INTRAMUSCULAR | Status: AC
  Filled 2016-06-03: qty 1

## 2016-06-03 MED ORDER — MIDAZOLAM HCL 2 MG/2ML IJ SOLN
INTRAMUSCULAR | Status: AC
  Filled 2016-06-03: qty 2

## 2016-06-03 MED ORDER — DEXAMETHASONE SODIUM PHOSPHATE 10 MG/ML IJ SOLN
INTRAMUSCULAR | Status: AC
  Filled 2016-06-03: qty 1

## 2016-06-03 MED ORDER — IBUPROFEN 100 MG/5ML PO SUSP: 400.0000 mg | Freq: Four times a day (QID) | ORAL | Status: DC | PRN

## 2016-06-03 MED ORDER — KETOROLAC TROMETHAMINE 30 MG/ML IJ SOLN
30.0000 mg | Freq: Once | INTRAMUSCULAR | Status: AC
  Administered 2016-06-03: 30 mg via INTRAVENOUS

## 2016-06-03 MED ORDER — FENTANYL CITRATE (PF) 100 MCG/2ML IJ SOLN
INTRAMUSCULAR | Status: AC
  Filled 2016-06-03: qty 4

## 2016-06-03 MED ORDER — LIDOCAINE HCL (CARDIAC) 20 MG/ML IV SOLN
INTRAVENOUS | Status: DC | PRN
  Administered 2016-06-03: 100 mg via INTRAVENOUS

## 2016-06-03 MED ORDER — BACITRACIN ZINC 500 UNIT/GM EX OINT: 1.0000 "application " | TOPICAL_OINTMENT | Freq: Three times a day (TID) | CUTANEOUS | Status: DC

## 2016-06-03 MED ORDER — ROCURONIUM BROMIDE 50 MG/5ML IV SOSY
PREFILLED_SYRINGE | INTRAVENOUS | Status: AC
  Filled 2016-06-03: qty 5

## 2016-06-03 MED ORDER — MIDAZOLAM HCL 5 MG/5ML IJ SOLN
INTRAMUSCULAR | Status: DC | PRN
  Administered 2016-06-03: 2 mg via INTRAVENOUS

## 2016-06-03 MED ORDER — ONDANSETRON HCL 4 MG/2ML IJ SOLN
INTRAMUSCULAR | Status: AC
  Filled 2016-06-03: qty 2

## 2016-06-03 MED ORDER — PROMETHAZINE HCL 25 MG RE SUPP: 25.0000 mg | Freq: Four times a day (QID) | RECTAL | 1 refills | Status: DC | PRN

## 2016-06-03 MED ORDER — FENTANYL CITRATE (PF) 100 MCG/2ML IJ SOLN
INTRAMUSCULAR | Status: AC
  Filled 2016-06-03: qty 2

## 2016-06-03 MED ORDER — DEXTROSE-NACL 5-0.9 % IV SOLN: INTRAVENOUS | Status: DC

## 2016-06-03 MED ORDER — ACETAMINOPHEN 10 MG/ML IV SOLN
INTRAVENOUS | Status: DC | PRN
Start: 2016-06-03 — End: 2016-06-03
  Administered 2016-06-03: 1000 mg via INTRAVENOUS

## 2016-06-03 MED ORDER — PHENOL 1.4 % MT LIQD: 1.0000 | OROMUCOSAL | Status: DC | PRN

## 2016-06-03 MED ORDER — SUGAMMADEX SODIUM 200 MG/2ML IV SOLN
INTRAVENOUS | Status: DC | PRN
  Administered 2016-06-03: 200 mg via INTRAVENOUS

## 2016-06-03 MED ORDER — KETOROLAC TROMETHAMINE 30 MG/ML IJ SOLN
INTRAMUSCULAR | Status: AC
  Administered 2016-06-03: 30 mg
  Filled 2016-06-03: qty 1

## 2016-06-03 MED ORDER — PROPOFOL 10 MG/ML IV BOLUS
INTRAVENOUS | Status: DC | PRN
  Administered 2016-06-03: 200 mg via INTRAVENOUS

## 2016-06-03 MED ORDER — 0.9 % SODIUM CHLORIDE (POUR BTL) OPTIME
TOPICAL | Status: DC | PRN
  Administered 2016-06-03: 1000 mL

## 2016-06-03 MED ORDER — ONDANSETRON HCL 4 MG/2ML IJ SOLN: 4.0000 mg | Freq: Once | INTRAMUSCULAR | Status: DC | PRN

## 2016-06-03 MED ORDER — ROCURONIUM BROMIDE 100 MG/10ML IV SOLN
INTRAVENOUS | Status: DC | PRN
  Administered 2016-06-03: 30 mg via INTRAVENOUS

## 2016-06-03 MED ORDER — LACTATED RINGERS IV SOLN
INTRAVENOUS | Status: DC | PRN
  Administered 2016-06-03: 07:00:00 via INTRAVENOUS

## 2016-06-03 MED ORDER — DEXAMETHASONE SODIUM PHOSPHATE 10 MG/ML IJ SOLN
INTRAMUSCULAR | Status: DC | PRN
  Administered 2016-06-03: 10 mg via INTRAVENOUS

## 2016-06-03 MED ORDER — FENTANYL CITRATE (PF) 100 MCG/2ML IJ SOLN
INTRAMUSCULAR | Status: AC
  Administered 2016-06-03: 50 ug
  Filled 2016-06-03: qty 2

## 2016-06-03 MED ORDER — MEPERIDINE HCL 25 MG/ML IJ SOLN: 6.2500 mg | INTRAMUSCULAR | Status: DC | PRN

## 2016-06-03 MED ORDER — ONDANSETRON HCL 4 MG/2ML IJ SOLN
INTRAMUSCULAR | Status: DC | PRN
  Administered 2016-06-03: 4 mg via INTRAVENOUS

## 2016-06-03 MED ORDER — LIDOCAINE 2% (20 MG/ML) 5 ML SYRINGE
INTRAMUSCULAR | Status: AC
  Filled 2016-06-03: qty 5

## 2016-06-03 MED ORDER — FENTANYL CITRATE (PF) 100 MCG/2ML IJ SOLN
25.0000 ug | INTRAMUSCULAR | Status: DC | PRN
  Administered 2016-06-03 (×3): 50 ug via INTRAVENOUS

## 2016-06-03 MED ORDER — FENTANYL CITRATE (PF) 100 MCG/2ML IJ SOLN
INTRAMUSCULAR | Status: DC | PRN
  Administered 2016-06-03: 25 ug via INTRAVENOUS
  Administered 2016-06-03: 100 ug via INTRAVENOUS
  Administered 2016-06-03: 25 ug via INTRAVENOUS
  Administered 2016-06-03: 50 ug via INTRAVENOUS

## 2016-06-03 MED ORDER — PROPOFOL 10 MG/ML IV BOLUS
INTRAVENOUS | Status: AC
  Filled 2016-06-03: qty 40

## 2016-06-03 MED ORDER — HYDROCODONE-ACETAMINOPHEN 7.5-325 MG/15ML PO SOLN
10.0000 mL | ORAL | Status: DC | PRN
  Administered 2016-06-03: 15 mL via ORAL

## 2016-06-03 MED ORDER — HYDROCODONE-ACETAMINOPHEN 7.5-325 MG/15ML PO SOLN: 15.0000 mL | Freq: Four times a day (QID) | ORAL | 0 refills | Status: DC | PRN

## 2016-06-03 SURGICAL SUPPLY — 34 items
BLADE SURG 15 STRL LF DISP TIS (BLADE) IMPLANT
BLADE SURG 15 STRL SS (BLADE)
CANISTER SUCTION 2500CC (MISCELLANEOUS) ×2 IMPLANT
CATH ROBINSON RED A/P 10FR (CATHETERS) ×2 IMPLANT
CLEANER TIP ELECTROSURG 2X2 (MISCELLANEOUS) ×2 IMPLANT
COAGULATOR SUCT SWTCH 10FR 6 (ELECTROSURGICAL) ×2 IMPLANT
DRAPE PROXIMA HALF (DRAPES) IMPLANT
ELECT COATED BLADE 2.86 ST (ELECTRODE) ×2 IMPLANT
ELECT REM PT RETURN 9FT ADLT (ELECTROSURGICAL) ×2
ELECT REM PT RETURN 9FT PED (ELECTROSURGICAL)
ELECTRODE REM PT RETRN 9FT PED (ELECTROSURGICAL) IMPLANT
ELECTRODE REM PT RTRN 9FT ADLT (ELECTROSURGICAL) IMPLANT
GAUZE SPONGE 4X4 16PLY XRAY LF (GAUZE/BANDAGES/DRESSINGS) ×2 IMPLANT
GLOVE ECLIPSE 7.5 STRL STRAW (GLOVE) ×2 IMPLANT
GOWN STRL REUS W/ TWL LRG LVL3 (GOWN DISPOSABLE) ×2 IMPLANT
GOWN STRL REUS W/TWL LRG LVL3 (GOWN DISPOSABLE) ×4
KIT BASIN OR (CUSTOM PROCEDURE TRAY) ×2 IMPLANT
KIT ROOM TURNOVER OR (KITS) ×2 IMPLANT
NDL PRECISIONGLIDE 27X1.5 (NEEDLE) IMPLANT
NEEDLE PRECISIONGLIDE 27X1.5 (NEEDLE) IMPLANT
NS IRRIG 1000ML POUR BTL (IV SOLUTION) ×2 IMPLANT
PACK SURGICAL SETUP 50X90 (CUSTOM PROCEDURE TRAY) ×2 IMPLANT
PAD ARMBOARD 7.5X6 YLW CONV (MISCELLANEOUS) ×4 IMPLANT
PENCIL FOOT CONTROL (ELECTRODE) ×2 IMPLANT
SPECIMEN JAR SMALL (MISCELLANEOUS) ×4 IMPLANT
SPONGE TONSIL 1 RF SGL (DISPOSABLE) ×2 IMPLANT
SYR BULB 3OZ (MISCELLANEOUS) ×2 IMPLANT
TOWEL OR 17X24 6PK STRL BLUE (TOWEL DISPOSABLE) ×4 IMPLANT
TUBE CONNECTING 12X1/4 (SUCTIONS) ×2 IMPLANT
TUBE SALEM SUMP 10F W/ARV (TUBING) IMPLANT
TUBE SALEM SUMP 12R W/ARV (TUBING) IMPLANT
TUBE SALEM SUMP 14F W/ARV (TUBING) IMPLANT
TUBE SALEM SUMP 16 FR W/ARV (TUBING) ×1 IMPLANT
WATER STERILE IRR 1000ML POUR (IV SOLUTION) ×1 IMPLANT

## 2016-06-03 NOTE — Progress Notes (Signed)
Per Dr. Jillyn Hidden labs from 05/22/16 are okay for surgery.

## 2016-06-03 NOTE — Transfer of Care (Signed)
Immediate Anesthesia Transfer of Care Note  Patient: Tanya Sanders  Procedure(s) Performed: Procedure(s): TONSILLECTOMY (N/A)  Patient Location: PACU  Anesthesia Type:General  Level of Consciousness: awake, alert  and oriented  Airway & Oxygen Therapy: Patient Spontanous Breathing and Patient connected to nasal cannula oxygen  Post-op Assessment: Report given to RN and Post -op Vital signs reviewed and stable  Post vital signs: Reviewed and stable  Last Vitals:  Vitals:   06/03/16 0646 06/03/16 0804  BP: 111/70   Pulse: 78   Resp: 18   Temp: 36.9 C (P) 36.8 C    Last Pain:  Vitals:   06/03/16 0646  TempSrc: Oral  PainSc:       Patients Stated Pain Goal: 3 ([id number])  Complications: No apparent anesthesia complications

## 2016-06-03 NOTE — Interval H&P Note (Signed)
History and Physical Interval Note:  06/03/2016 7:04 AM  Tanya Sanders  has presented today for surgery, with the diagnosis of PERITONSILLAR ABSCESS  The various methods of treatment have been discussed with the patient and family. After consideration of risks, benefits and other options for treatment, the patient has consented to  Procedure(s): TONSILLECTOMY (N/A) as a surgical intervention .  The patient's history has been reviewed, patient examined, no change in status, stable for surgery.  I have reviewed the patient's chart and labs.  Questions were answered to the patient's satisfaction.     Ewing Fandino

## 2016-06-03 NOTE — H&P (View-Only) (Signed)
  HPI:    Tanya Sanders is a 31 y.o. female who presents as a return  Patient.   Referring Provider: Referral, Self   Chief complaint: Sore throat.  HPI: A couple of days after I saw her last, and had a negative aspirate, she got much worse and went to emergency department.  She was found to have an abscess at that point which was incised and drained effectively.  She felt much better but never completely cleared.  She completed her antibiotic on Monday and by yesterday was having severe pain again.  She did get a shot of steroid and antibiotic yesterday.  She has a history of peritonsillar abscess as a teenager as well.  Otherwise in good health.  PMH/Meds/All/SocHx/FamHx/ROS:   Past Medical History  History reviewed. No pertinent past medical history.    Past Surgical History       Past Surgical History:  Procedure Laterality Date  . CESAREAN SECTION    . CHOLECYSTECTOMY        No family history of bleeding disorders, wound healing problems or difficulty with anesthesia.   Social History  Social History        Social History  . Marital status: Single    Spouse name: N/A  . Number of children: N/A  . Years of education: N/A      Occupational History  . Not on file.       Social History Main Topics  . Smoking status: Never Smoker  . Smokeless tobacco: Never Used  . Alcohol use Yes  . Drug use: No  . Sexual activity: Not on file       Other Topics Concern  . Not on file      Social History Narrative  . No narrative on file       Current Outpatient Prescriptions:  .  clindamycin (CLEOCIN) 300 MG capsule, Take by mouth., Disp: , Rfl:  .  LARIN 1.5/30, 21, 1.5-30 mg-mcg Tab, , Disp: , Rfl: 4  A complete ROS was performed with pertinent positives/negatives noted in the HPI. The remainder of the ROS are negative.    Physical Exam:    There were no vitals taken for this visit.   General:  Healthy and alert, in no  distress, breathing easily. Normal affect. In a pleasant mood. Head: Normocephalic, atraumatic. No masses, or scars. Eyes: Pupils are equal, and reactive to light. Vision is grossly intact. No spontaneous or gaze nystagmus. Ears: Ear canals are clear. Tympanic membranes are intact, with normal landmarks and the middle ears are clear and healthy. Hearing: Grossly normal. Nose: Nasal cavities are clear with healthy mucosa, no polyps or exudate.Airways are patent. Face: No masses or scars, facial nerve function is symmetric. Oral Cavity: No mucosal abnormalities are noted. Tongue with normal mobility. Dentition appears healthy. Oropharynx: Tonsils are asymmetrically enlarged on the right without obvious erythema of the soft palate mucosa. There are no mucosal masses identified. Tongue base appears normal and healthy. Larynx/Hypopharynx: deferred Chest: Deferred Neck: No palpable masses, no cervical adenopathy, no thyroid nodules or enlargement. Neuro: Cranial nerves II-XII will normal function. Balance: Normal gate. Other findings: none.   Independent Review of Additional Tests or Records:  none  Procedures:  none   Impression & Plans:  Recurrent peritonsillar abscess.  At this point, my recommendation is for urgent tonsillectomy.[place] meets the indications for tonsillectomy. Risks and benefits were discussed in detail. All questions were answered. A handout was provided with additional details.

## 2016-06-03 NOTE — Anesthesia Procedure Notes (Signed)
Procedure Name: Intubation Date/Time: 06/03/2016 7:20 AM Performed by: Candis Shine Pre-anesthesia Checklist: Patient identified, Emergency Drugs available, Suction available and Patient being monitored Patient Re-evaluated:Patient Re-evaluated prior to inductionOxygen Delivery Method: Circle System Utilized Preoxygenation: Pre-oxygenation with 100% oxygen Intubation Type: IV induction Ventilation: Mask ventilation without difficulty Laryngoscope Size: Mac and 3 Grade View: Grade I Tube type: Oral Number of attempts: 1 Airway Equipment and Method: Stylet Placement Confirmation: ETT inserted through vocal cords under direct vision,  positive ETCO2 and breath sounds checked- equal and bilateral Secured at: 22 cm Tube secured with: Tape Dental Injury: Teeth and Oropharynx as per pre-operative assessment

## 2016-06-03 NOTE — Op Note (Signed)
06/03/2016  7:54 AM  PATIENT:  [name]  31 y.o. female  PRE-OPERATIVE DIAGNOSIS:  PERITONSILLAR ABSCESS  POST-OPERATIVE DIAGNOSIS:  PERITONSILLAR ABSCESS  PROCEDURE:  Procedure(s): TONSILLECTOMY  SURGEON:  Surgeon(s): Izora Gala, MD  ANESTHESIA:   General  COUNTS: Correct  EBL: 50 cc    DICTATION: The patient was taken to the operating room and placed on the operating table in the supine position. Following induction of general endotracheal anesthesia, the table was turned and the patient was draped in a standard fashion. A Crowe-Davis mouthgag was inserted into the oral cavity and used to retract the tongue and mandible, then attached to the Mayo stand.  The tonsillectomy was then performed using electrocautery dissection, carefully dissecting the avascular plane between the capsule and constrictor muscles. A large peritonsillar plane was already opened on the right from the prior procedure. Suction Cautery was used for completion of hemostasis. The tonsils were sent for pathology together.   The pharynx was irrigated with saline and suctioned. An oral gastric tube was used to aspirate the contents of the stomach. The patient was then awakened from anesthesia and transferred to PACU in stable condition.   PATIENT DISPOSITION:  To PACA, stable

## 2016-06-03 NOTE — Anesthesia Postprocedure Evaluation (Addendum)
Anesthesia Post Note  Patient: Tanya Sanders  Procedure(s) Performed: Procedure(s) (LRB): TONSILLECTOMY (N/A)  Patient location during evaluation: PACU Anesthesia Type: General Level of consciousness: awake and sedated Pain management: pain level not controlled Vital Signs Assessment: post-procedure vital signs reviewed and stable Respiratory status: spontaneous breathing Cardiovascular status: stable Postop Assessment: no signs of nausea or vomiting Anesthetic complications: no        Last Vitals:  Vitals:   06/03/16 0815 06/03/16 0830  BP: 133/89 131/89  Pulse: 99 86  Resp: 12 14  Temp:      Last Pain:  Vitals:   06/03/16 0830  TempSrc:   PainSc: 7    Pain Goal: Patients Stated Pain Goal: 3 (06/03/16 [id number])               [name] JR,JOHN Mateo Flow

## 2016-06-03 NOTE — Discharge Instructions (Signed)
Diet Following Tonsillectomy, Adult Introduction A tonsillectomy is a surgery to remove your tonsils. After a tonsillectomy you should eat foods that are easy to swallow and gentle on the throat. This makes recovery easier. Follow the diet guidelines on this sheet for 1-2 weeks or until any pain from the surgery is completely gone. What do I need to know about this diet? In the first 24 hours after surgery:  Do not eat any food.  Do not drink citrus juices or liquids that are cloudy.  You may drink liquids that are clear, such as water, chicken broth, apple juice, and lemon-lime soda that has been set out to remove the carbonation. After the first 24 hours:  You may only eat soft foods.  You may drink any liquid except citrus juices. For example, do not drink orange juice.  Do not eat foods that are hard or that have a hard crust.  Do not eat hot, spicy, or highly seasoned foods. While you are on this diet:  Cut foods into small pieces and chew them well.  Drink several glasses of warm water daily.  Consider drinking a liquid nutritional supplement, such as a liquid nutrition drink. Your health care provider can give you recommendations. Which foods can I eat? Grains  Soft bread. Soggy waffles or [place] toast without crust and soaked in syrup. Pancakes. Oatmeal or other creamy cereal. Soggy, cold cereal. Pasta noodles. Vegetables  Cooked vegetables. Mashed potatoes. Fruits  Applesauce. Bananas. Canned fruit. Watermelon without seeds. Meats and Other Protein Sources  Hot dogs. Hamburger. Tender, moist meat. [place]. Scrambled or poached eggs. Dairy  Milk. Smooth yogurt. Cottage cheese. Processed cheeses. Beverages  Milk. Juices without seeds. Soda without carbonation. Sweets and Desserts  Custard. Pudding. Ice cream. Malts. Shakes. Popsicles. Other  Soup. Macaroni and cheese. Smooth nut butter, such as smooth peanut butter. Smooth peanut butter and jelly sandwiches without  crust. The items listed above may not be a complete list of recommended foods or beverages. Ask your dietitian for more options.  Which foods are not recommended? Grains  Toast. Crispy waffles. Crunchy, cold cereal. Crackers. Pretzels. Popcorn. Chips. Any grain that is dry, hard, or has a hard crust. Vegetables  Any raw vegetables. Fruits  All citrus fruits. Most fresh fruits, including oranges, apples, and melon. Meats and Other Protein Sources  Tough, dry meat. Poultry. Fish. Nuts. Crunchy peanut butter or other crunchy nut butters. Beverages  Citrus juices (such as orange juice or lemonade). Any soda or carbonated beverage with bubbles. Sweets and Desserts  Cookies. Any dessert that contains nuts, seeds, or coconut. Other  Fried foods. Grilled cheese sandwiches. The items listed above may not be a complete list of foods and beverages that are not recommended. Ask your dietitian for more information.  This information is not intended to replace advice given to you by your health care provider. Make sure you discuss any questions you have with your health care provider. Document Released: 05/10/2005 Document Revised: 10/16/2015 Document Reviewed: 10/09/2013  2017 Elsevier Tonsillectomy, Adult, Care After Refer to this sheet in the next few weeks. These instructions provide you with information on caring for yourself after your procedure. Your health care provider may also give you more specific instructions. Your treatment has been planned according to current medical practices, but problems sometimes occur. Call your health care provider if you have any problems or questions after your procedure. WHAT TO EXPECT AFTER THE PROCEDURE After your procedure, it is typical to have the following:  Your tongue will be numb, and your sense of taste will be reduced.  Your swallowing will be difficult and painful.  Your jaw may hurt or make a clicking noise when you yawn or chew.  Liquids that  you drink may leak out of your nose.  Your voice may sound muffled.  The area at the middle of the roof of your mouth (uvula) may be very swollen.  You may have a constant cough and need to clear mucus and phlegm from your throat. HOME CARE INSTRUCTIONS   Get proper rest, keeping your head elevated at all times.  Drink plenty of fluids. This reduces pain and hastens the healing process.  Take medicines only as directed by your health care provider.  Soft and cold foods, such as gelatin, sherbet, ice cream, frozen ice pops, and cold drinks, are usually the easiest to eat. Several days after surgery, you will be able to eat more solid food.  Avoid mouthwashes and gargles.  Avoid contact with people who have upper respiratory infections, such as colds and sore throats. SEEK MEDICAL CARE IF:   You have increasing pain that is not controlled with medicines.  You have a fever.  You have a rash.  You feel light-headed or faint.  You are unable to swallow even small amounts of liquid or saliva.  Your urine is becoming very dark. SEEK IMMEDIATE MEDICAL CARE IF:   You have difficulty breathing.  You experience side effects or allergic reactions to medicines.  You bleed bright red blood from your throat, or you vomit bright red blood. This information is not intended to replace advice given to you by your health care provider. Make sure you discuss any questions you have with your health care provider. Document Released: 03/10/2004 Document Revised: 09/24/2014 Document Reviewed: 12/05/2012 Elsevier Interactive Patient Education  2017 [place] op diet instructions: Liquids and soft food

## 2016-06-04 ENCOUNTER — Encounter (HOSPITAL_COMMUNITY): Payer: Self-pay | Admitting: Otolaryngology

## 2016-06-08 ENCOUNTER — Encounter: Payer: Self-pay | Admitting: Family Medicine

## 2016-07-13 MED FILL — LARIN 1.5 MG-30 MCG TABLET: 1.5-30 | 84 days supply | Qty: 63 | Fill #0

## 2016-08-04 DIAGNOSIS — H52203 Unspecified astigmatism, bilateral: Secondary | ICD-10-CM | POA: Diagnosis not present

## 2016-08-04 DIAGNOSIS — H5213 Myopia, bilateral: Secondary | ICD-10-CM | POA: Diagnosis not present

## 2016-09-20 MED FILL — LARIN 1.5 MG-30 MCG TABLET: 1.5-30 | 84 days supply | Qty: 63 | Fill #1

## 2016-10-26 ENCOUNTER — Encounter: Payer: Self-pay | Admitting: Family Medicine

## 2016-10-28 ENCOUNTER — Ambulatory Visit: Payer: 59 | Admitting: Family Medicine

## 2016-10-29 ENCOUNTER — Encounter: Payer: Self-pay | Admitting: Family Medicine

## 2016-10-29 ENCOUNTER — Ambulatory Visit (INDEPENDENT_AMBULATORY_CARE_PROVIDER_SITE_OTHER): Payer: 59 | Admitting: Family Medicine

## 2016-10-29 VITALS — BP 122/76 | Ht 66.0 in | Wt 191.1 lb

## 2016-10-29 DIAGNOSIS — E669 Obesity, unspecified: Secondary | ICD-10-CM | POA: Diagnosis not present

## 2016-10-29 DIAGNOSIS — R5382 Chronic fatigue, unspecified: Secondary | ICD-10-CM | POA: Diagnosis not present

## 2016-10-29 MED ORDER — NALTREXONE-BUPROPION HCL ER 8-90 MG PO TB12
ORAL_TABLET | ORAL | 3 refills | Status: DC
Start: 2016-10-29 — End: 2017-02-01

## 2016-10-29 NOTE — Addendum Note (Signed)
Addendum  created 10/29/16 9249 by Lyn Hollingshead, MD   Sign clinical note

## 2016-10-29 NOTE — Progress Notes (Signed)
   Subjective:    Patient ID: Tanya Sanders, female    DOB: 07-14-1985, 31 y.o.   MRN: [id number]  HPI Patient in today to discuss losing weight. Patient would like to start on contrave.has maternal excess weight issues  Hx of elev weight   States no other concerns this visit.   Felt itchy from the phentermine   pt los some weight wit the phentermine but id not tolerate  Lost weight with tonsilllar abscess  Pt mindful about eating  Ties toexercise  Tries to eat well  BMI   Not sports in high school . But id in mid school  More active now nd now active  Still quit on smoking, no sig weight gain post    Tried inprocessed only  occas   Review of Systems No headache, no major weight loss or weight gain, no chest pain no back pain abdominal pain no change in bowel habits complete ROS otherwise negative     Objective:   Physical Exam Elevated BMI 31  Obesity present and evident  Alert vitals stable, NAD. Blood pressure good on repeat. HEENT normal. Lungs clear. Heart regular rate and rhythm.        Assessment & Plan:  Impression obesity substantial. Patient now noting symptoms with it. Tendency toward shortness of breath with exertion and general fatigue. Also trying hard with diet and exercise. Addition tick phentermine but had side effects with it plan trial of contrave discussed patient would like to try/prescribed for next several months follow-up diet exercise discussed in encourage

## 2016-11-01 ENCOUNTER — Telehealth: Payer: Self-pay | Admitting: Family Medicine

## 2016-11-01 NOTE — Telephone Encounter (Signed)
Rx Prior Auth submitted by fax to [facility] for pt's Naltrexone-Bupropion HCl ER 8-90 MG   Await decision

## 2016-11-04 NOTE — Telephone Encounter (Signed)
Rx prior auth APPROVED for pt's Naltrexone-Bupropion HCl ER 8-90 MG      Sig: Take 1 tablet by mouth in the morning for 7 days, then take 1 tablet by mouth twice a day for 7 days, then take 2 tablets by mouth every morning and 1 tablet in the evening for 7 days, then take 2 tablets by mouth twice daily  PA ref# [place] approval letter to be scanned in to the chart & faxed to [facility]

## 2016-11-26 ENCOUNTER — Ambulatory Visit (INDEPENDENT_AMBULATORY_CARE_PROVIDER_SITE_OTHER): Payer: 59 | Admitting: Obstetrics & Gynecology

## 2016-11-26 ENCOUNTER — Encounter: Payer: Self-pay | Admitting: Obstetrics & Gynecology

## 2016-11-26 VITALS — BP 124/76

## 2016-11-26 DIAGNOSIS — N9412 Deep dyspareunia: Secondary | ICD-10-CM | POA: Diagnosis not present

## 2016-11-26 DIAGNOSIS — N898 Other specified noninflammatory disorders of vagina: Secondary | ICD-10-CM | POA: Diagnosis not present

## 2016-11-26 LAB — WET PREP FOR TRICH, YEAST, CLUE
CLUE CELLS WET PREP: NONE SEEN
TRICH WET PREP: NONE SEEN

## 2016-11-26 MED ORDER — FLUCONAZOLE 150 MG PO TABS: 150.0000 mg | ORAL_TABLET | Freq: Every day | ORAL | 1 refills | Status: AC

## 2016-11-26 NOTE — Progress Notes (Signed)
    Tanya Sanders [birth year] [id number]        31 y.o.  G2P2L2  Married.  Tanya Sanders is 31 yo.  RP:  Vaginal discomfort with dryness and positional deep dyspareunia  HPI:  Well on [place] 1.5/30 x 1 1/2 year.  Last Annual/Gyn exam 03/2016 with normal pap.  No abnormal bleeding on BPCs.  No pelvic pain.  But for many months, per patient, might be since delivery of youngest 3 yrs ago, experiences deep dyspareunia with deep IC from behind.  No deep dyspareunia in other positions and no superficial dyspareunia.  For 1 week, feels vaginal dryness which is uncomfortable, no abnormal d/c noted, just no secretion.  Mictions normal/BMs normal.   Past medical history,surgical history, problem list, medications, allergies, family history and social history were all reviewed and documented in the EPIC chart.  Directed ROS with pertinent positives and negatives documented in the history of present illness/assessment and plan.  Exam:  Vitals:   11/26/16 0834  BP: 124/76   General appearance:  Normal  Gyn exam:  Vulva normal                     Speculum:  Cervix/Vagina normal, but secretions c/w Yeast.  Wet prep done.  Declines STD w-up.                     Bimanual exam:  Uterus RV, normal volume, mobile.  Cervix NT to mobilisation.  No adnexal mass, NT.   Assessment/Plan:  31 y.o. G2P1002   1. Vaginal dryness Yeast Vaginitis.  Diflucan 150 mg PO qd x 3 prescribed.  Recommend Probiotic vaginally (tab or supp) qweek for prevention. - WET PREP FOR TRICH, YEAST, CLUE:  Yeast Vaginitis positive  2. Deep dyspareunia Positional with RV uterus.  No evidence of pathology.  Recommendations discussed, reassured.  Counseling on above issues >50% x 15 minutes.  Princess Bruins MD, 9:03 AM 11/26/2016

## 2016-11-26 NOTE — Patient Instructions (Signed)
1. Vaginal dryness Yeast Vaginitis.  Diflucan 150 mg PO qd x 3 prescribed.  Recommend Probiotic vaginally (tab or supp) qweek for prevention. - WET PREP FOR TRICH, YEAST, CLUE:  Yeast Vaginitis positive  2. Deep dyspareunia Positional with RV uterus.  No evidence of pathology.  Recommendations discussed, reassured.  Tanya Sanders, it was a pleasure to see you today!

## 2016-12-07 ENCOUNTER — Encounter: Payer: Self-pay | Admitting: Family Medicine

## 2016-12-07 MED ORDER — NITROFURANTOIN MACROCRYSTAL 100 MG PO CAPS: ORAL_CAPSULE | ORAL | 0 refills | Status: DC

## 2016-12-07 NOTE — Addendum Note (Signed)
Addended by: Launa Grill on: 12/07/2016 04:48 PM   Modules accepted: Orders

## 2016-12-07 NOTE — Telephone Encounter (Signed)
I recommend Macrodantin 100 mg 1 twice a day for 7 days. May also use OTC Azo-Standard for a few days to help with the dysuria-nurse's-please send then to [facility] in notify patient

## 2016-12-13 MED FILL — LARIN 1.5 MG-30 MCG TABLET: 1.5-30 | 63 days supply | Qty: 63 | Fill #2

## 2016-12-16 ENCOUNTER — Telehealth: Payer: Self-pay | Admitting: Family Medicine

## 2016-12-16 DIAGNOSIS — Z0000 Encounter for general adult medical examination without abnormal findings: Secondary | ICD-10-CM

## 2016-12-16 NOTE — Telephone Encounter (Signed)
Patient has an appointment on 01/28/17 with Dr. Richardson Landry.  She is requesting order for labs, specifically hormones and thyroid.

## 2016-12-16 NOTE — Telephone Encounter (Signed)
met7 lip liv tsh estroge

## 2016-12-16 NOTE — Telephone Encounter (Signed)
Spoke with patient and informed her per Dr.Steve [place] have been ordered for appointment on 01/28/17. Patient verbalized understanding.

## 2016-12-28 ENCOUNTER — Encounter: Payer: Self-pay | Admitting: Family Medicine

## 2016-12-28 ENCOUNTER — Ambulatory Visit (INDEPENDENT_AMBULATORY_CARE_PROVIDER_SITE_OTHER): Payer: 59 | Admitting: Family Medicine

## 2016-12-28 VITALS — BP 128/84 | Temp 98.3°F | Ht 66.0 in | Wt 184.0 lb

## 2016-12-28 DIAGNOSIS — R300 Dysuria: Secondary | ICD-10-CM | POA: Diagnosis not present

## 2016-12-28 DIAGNOSIS — N3000 Acute cystitis without hematuria: Secondary | ICD-10-CM | POA: Diagnosis not present

## 2016-12-28 MED ORDER — CEFDINIR 300 MG PO CAPS: 300.0000 mg | ORAL_CAPSULE | Freq: Two times a day (BID) | ORAL | 0 refills | Status: DC

## 2016-12-28 MED ORDER — FLUCONAZOLE 150 MG PO TABS: ORAL_TABLET | ORAL | 0 refills | Status: DC

## 2016-12-28 NOTE — Progress Notes (Signed)
   Subjective:    Patient ID: Tanya Sanders, female    DOB: 03-30-1986, 31 y.o.   MRN: [id number]  Urinary Tract Infection   This is a new problem. The current episode started today.  Complaining of burning and stinging frequency and odor.  Pt had a symtoms then she "knew" she had one. Took husbands abx  Then went to the syn for vag dryness  Went to [employer] and had a massive uti, took a rx over the phone  Pt took another dose of diflucan then    Currently experiencing dysuria. Increase frequency. Some nocturnal frequency. Some hesitancy. No fever no chills no nausea no vomiting        No other concerns. Review of Systems ROS otherwise negative    Objective:   Physical Exam  Alert vitals stable, NAD. Blood pressure good on repeat. HEENT normal. Lungs clear. Heart regular rate and rhythm. No CVA tenderness  Urinalysis 6 to 8 white blood cells per high-power field      Assessment & Plan:  Impression urinary tract infection. Either partially treated versus resistant versus recurrent. Discussed. Plan 7 day course of Omnicef. Has artery had both Cipro and nitrofurantoin. Warning signs discussed. Culture urine further recommendations based on results

## 2016-12-29 DIAGNOSIS — Z0000 Encounter for general adult medical examination without abnormal findings: Secondary | ICD-10-CM | POA: Diagnosis not present

## 2016-12-31 LAB — BASIC METABOLIC PANEL
BUN / CREAT RATIO: 8 — AB (ref 9–23)
BUN: 8 mg/dL (ref 6–20)
CHLORIDE: 107 mmol/L — AB (ref 96–106)
CO2: 23 mmol/L (ref 20–29)
Calcium: 9.1 mg/dL (ref 8.7–10.2)
Creatinine, Ser: 0.96 mg/dL (ref 0.57–1.00)
GFR calc non Af Amer: 79 mL/min/{1.73_m2} (ref >59)
GFR, EST AFRICAN AMERICAN: 91 mL/min/{1.73_m2} (ref >59)
Glucose: 96 mg/dL (ref 65–99)
Potassium: 5 mmol/L (ref 3.5–5.2)
Sodium: 140 mmol/L (ref 134–144)

## 2016-12-31 LAB — HEPATIC FUNCTION PANEL
ALT: 14 IU/L (ref 0–32)
AST: 19 IU/L (ref 0–40)
Albumin: 4.3 g/dL (ref 3.5–5.5)
Alkaline Phosphatase: 40 IU/L (ref 39–117)
BILIRUBIN TOTAL: 0.5 mg/dL (ref 0.0–1.2)
Bilirubin, Direct: 0.14 mg/dL (ref 0.00–0.40)
Total Protein: 6.6 g/dL (ref 6.0–8.5)

## 2016-12-31 LAB — LIPID PANEL
CHOL/HDL RATIO: 3.5 ratio (ref 0.0–4.4)
Cholesterol, Total: 178 mg/dL (ref 100–199)
HDL: 51 mg/dL (ref >39)
LDL Calculated: 113 mg/dL — ABNORMAL HIGH (ref 0–99)
Triglycerides: 71 mg/dL (ref 0–149)
VLDL CHOLESTEROL CAL: 14 mg/dL (ref 5–40)

## 2016-12-31 LAB — ESTROGENS, TOTAL: ESTROGEN: 75 pg/mL

## 2016-12-31 LAB — TSH: TSH: 1.74 u[IU]/mL (ref 0.450–4.500)

## 2017-01-01 LAB — URINE CULTURE

## 2017-01-03 ENCOUNTER — Encounter: Payer: Self-pay | Admitting: Family Medicine

## 2017-01-03 MED ORDER — NITROFURANTOIN MONOHYD MACRO 100 MG PO CAPS: 100.0000 mg | ORAL_CAPSULE | Freq: Two times a day (BID) | ORAL | 0 refills | Status: DC

## 2017-01-03 NOTE — Addendum Note (Signed)
Addended by: Karle Barr on: 01/03/2017 01:56 PM   Modules accepted: Orders

## 2017-01-05 ENCOUNTER — Encounter: Payer: Self-pay | Admitting: Family Medicine

## 2017-01-10 ENCOUNTER — Telehealth: Payer: Self-pay | Admitting: Family Medicine

## 2017-01-10 NOTE — Telephone Encounter (Signed)
Spoke with patient in regards to medication not being dispensed correctly directed patient to contact pharmacy.

## 2017-01-10 NOTE — Telephone Encounter (Signed)
Pt is requesting a call from a nurse regarding a prescription.

## 2017-01-27 ENCOUNTER — Encounter: Payer: 59 | Admitting: Family Medicine

## 2017-01-28 ENCOUNTER — Encounter: Payer: 59 | Admitting: Family Medicine

## 2017-02-01 ENCOUNTER — Ambulatory Visit (INDEPENDENT_AMBULATORY_CARE_PROVIDER_SITE_OTHER): Payer: 59 | Admitting: Family Medicine

## 2017-02-01 ENCOUNTER — Encounter: Payer: Self-pay | Admitting: Family Medicine

## 2017-02-01 VITALS — Ht 65.5 in | Wt 176.2 lb

## 2017-02-01 DIAGNOSIS — L723 Sebaceous cyst: Secondary | ICD-10-CM

## 2017-02-01 DIAGNOSIS — E669 Obesity, unspecified: Secondary | ICD-10-CM

## 2017-02-01 DIAGNOSIS — E66811 Obesity, class 1: Secondary | ICD-10-CM

## 2017-02-01 MED ORDER — NALTREXONE-BUPROPION HCL ER 8-90 MG PO TB12: ORAL_TABLET | ORAL | 3 refills | Status: DC

## 2017-02-01 NOTE — Progress Notes (Signed)
   Subjective:    Patient ID: Tanya Sanders, female    DOB: July 11, 1985, 31 y.o.   MRN: [id number]  HPI Patient in today for weight loss management. Patient also has concerns of possible abscess under right arm.  Mostly helping meds going well  exerfise nearly daily very active  Had very sore spot under the arm a few wks ago,  Felt a small nodule, no lnger painfull  Distinct tenderness  No hx laely    Review of Systems No headache, no major weight loss or weight gain, no chest pain no back pain abdominal pain no change in bowel habits complete ROS otherwise negative     Objective:   Physical Exam  Alert vitals stable, NAD. Blood pressure good on repeat. HEENT normal. Lungs clear. Heart regular rate and rhythm. Patient has lost 15 pounds in the last 3 months on medication.  Right axillary region reveals a small subcutaneous cyst      Assessment & Plan:  Impression 1 obesity clinically substantially improved. 15 pound weight loss. Will maintain contrary. Need to re-authorizing 10  #2 subcutaneous cyst patient reassured

## 2017-02-10 ENCOUNTER — Encounter: Payer: Self-pay | Admitting: Family Medicine

## 2017-02-10 ENCOUNTER — Telehealth: Payer: Self-pay | Admitting: *Deleted

## 2017-02-10 NOTE — Telephone Encounter (Signed)
Fax from [facility] for contrave 8mg  -90mg . Med is approved for 12 refills from 02/07/17 - 02/06/2018. Pharm notified.  Prior auth Reference number [id number].

## 2017-02-14 ENCOUNTER — Other Ambulatory Visit: Payer: Self-pay | Admitting: Nurse Practitioner

## 2017-02-14 MED FILL — LARIN 1.5 MG-30 MCG TABLET: 1.5-30 | 63 days supply | Qty: 63 | Fill #3

## 2017-02-15 MED ORDER — NALTREXONE-BUPROPION HCL ER 8-90 MG PO TB12: ORAL_TABLET | ORAL | 3 refills | Status: DC

## 2017-02-21 ENCOUNTER — Ambulatory Visit (INDEPENDENT_AMBULATORY_CARE_PROVIDER_SITE_OTHER): Payer: 59 | Admitting: Nurse Practitioner

## 2017-02-21 ENCOUNTER — Encounter: Payer: Self-pay | Admitting: Nurse Practitioner

## 2017-02-21 VITALS — BP 134/70 | Ht 65.5 in | Wt 181.1 lb

## 2017-02-21 DIAGNOSIS — N390 Urinary tract infection, site not specified: Secondary | ICD-10-CM

## 2017-02-21 MED ORDER — NITROFURANTOIN MONOHYD MACRO 100 MG PO CAPS: 100.0000 mg | ORAL_CAPSULE | Freq: Two times a day (BID) | ORAL | 0 refills | Status: DC

## 2017-02-21 NOTE — Progress Notes (Signed)
Subjective:  Presents with complaints of dysuria urgency and frequency that began 2 days ago. No fever. Nausea but no vomiting. Mild mid lower abdominal pain. No back or flank pain. Married, same sexual partner. Saw her gynecologist a few months ago and was diagnosed with a vaginal yeast infection which has resolved. Has had frequent problems with UTI/dysuria since June. The last infection was treated with Macrobid and a culture done, the symptoms resolved for while. Based on her description this appears to be her third distinct UTI. No vaginal discharge. No changes in her medications.  Objective:   BP 134/70   Ht 5' 5.5" (1.664 m)   Wt 181 lb 2 oz (82.2 kg)   BMI 29.68 kg/m  NAD. Alert, oriented. Lungs clear. Heart regular rate rhythm. No CVA tenderness. Abdomen soft nondistended with mild suprapubic area tenderness. UA could not be done due to AZO use. Urine microscopic rare WBC. Otherwise clear. Urine culture dated 12/28/2016 shows Escherichia coli.  Assessment:  Acute UTI    Plan:   Meds ordered this encounter  Medications  . nitrofurantoin, macrocrystal-monohydrate, (MACROBID) 100 MG capsule    Sig: Take 1 capsule (100 mg total) by mouth 2 (two) times daily.    Dispense:  14 capsule    Refill:  0    Order Specific Question:   Supervising Provider    Answer:   Mikey Kirschner [[id number]]   Continue AZO for 48 hours then discontinue. Increase clear fluid intake. Call back by the end of the week if no improvement, sooner if worse. Warning signs reviewed. Also contact our office once anabiotic is complete if symptoms recur, at that time recommend referral to urology for further evaluation.

## 2017-04-13 ENCOUNTER — Telehealth: Payer: Self-pay | Admitting: Family Medicine

## 2017-04-13 ENCOUNTER — Encounter: Payer: Self-pay | Admitting: Nurse Practitioner

## 2017-04-13 NOTE — Telephone Encounter (Signed)
Thinks she has another UTI, she is having abd fullness,pressure,frequency. She was told that she may need a referal to Urology.

## 2017-04-13 NOTE — Telephone Encounter (Signed)
Pt called stating that she is pretty sure that she has another uti. Pt is unsure if she needs to be reseen. Please advise.

## 2017-04-13 NOTE — Telephone Encounter (Signed)
Message sent through my chart

## 2017-05-04 ENCOUNTER — Ambulatory Visit: Payer: 59 | Admitting: Family Medicine

## 2017-05-09 ENCOUNTER — Encounter: Payer: Self-pay | Admitting: Family Medicine

## 2017-05-09 ENCOUNTER — Ambulatory Visit (INDEPENDENT_AMBULATORY_CARE_PROVIDER_SITE_OTHER): Payer: 59 | Admitting: Family Medicine

## 2017-05-09 VITALS — BP 130/84 | Ht 66.0 in | Wt 176.0 lb

## 2017-05-09 DIAGNOSIS — E669 Obesity, unspecified: Secondary | ICD-10-CM | POA: Diagnosis not present

## 2017-05-09 MED ORDER — NALTREXONE-BUPROPION HCL ER 8-90 MG PO TB12: ORAL_TABLET | ORAL | 5 refills | Status: DC

## 2017-05-09 NOTE — Progress Notes (Signed)
   Subjective:    Patient ID: Tanya Sanders, female    DOB: 1985-12-16, 31 y.o.   MRN: [id number]  HPIFollow up on weight loss medication.   Using the meds reg  Was down to 171   Exercise faith fully forut o six times per wk    191 in June  171 early   Fluid retintion with cycle  Patient tolerating medication well.  No obvious side effects.     Review of Systems No headache, no major weight loss or weight gain, no chest pain no back pain abdominal pain no change in bowel habits complete ROS otherwise negative     Objective:   Physical Exam Alert vitals stable, NAD. Blood pressure good on repeat. HEENT normal. Lungs clear. Heart regular rate and rhythm.        Assessment & Plan:  Impression obesity discussed tolerating meds well meds definitely help and would like to maintain refills written diet exercise discussed

## 2017-05-31 MED FILL — CONTRAVE ER 8-90 MG TABLET: 8-90 | 30 days supply | Qty: 120 | Fill #0

## 2017-07-06 MED FILL — CONTRAVE ER 8-90 MG TABLET: 8-90 | 30 days supply | Qty: 120 | Fill #1

## 2017-07-15 ENCOUNTER — Ambulatory Visit (INDEPENDENT_AMBULATORY_CARE_PROVIDER_SITE_OTHER): Payer: No Typology Code available for payment source | Admitting: Nurse Practitioner

## 2017-07-15 ENCOUNTER — Encounter: Payer: Self-pay | Admitting: Nurse Practitioner

## 2017-07-15 VITALS — BP 116/76 | Temp 98.0°F | Ht 66.0 in | Wt 176.8 lb

## 2017-07-15 DIAGNOSIS — B9689 Other specified bacterial agents as the cause of diseases classified elsewhere: Secondary | ICD-10-CM | POA: Diagnosis not present

## 2017-07-15 DIAGNOSIS — R300 Dysuria: Secondary | ICD-10-CM

## 2017-07-15 DIAGNOSIS — J069 Acute upper respiratory infection, unspecified: Secondary | ICD-10-CM | POA: Diagnosis not present

## 2017-07-15 LAB — POCT URINALYSIS DIPSTICK: pH, UA: 5 (ref 5.0–8.0)

## 2017-07-15 LAB — POCT UA - MICROSCOPIC ONLY: RBC, urine, microscopic: NEGATIVE

## 2017-07-15 MED ORDER — LEVOFLOXACIN 500 MG PO TABS: 500.0000 mg | ORAL_TABLET | Freq: Every day | ORAL | 0 refills | Status: DC

## 2017-07-15 NOTE — Progress Notes (Signed)
Subjective:    Patient ID: JULIUS BONIFACE, female    DOB: April 28, 1986, 32 y.o.   MRN: [id number]  HPI Patient presents today with upper respiratory issues that began last week. Hoarseness and sore throat initially. Beginning early this week she started experiencing a productive cough with green sputum, rhinorrhea with green nasal drainage, fatigue, and tooth pain. Patient denies fever, chills, or body aches. Additionally, patient reports that 3 days ago she started experiencing  burning on urination that escalated yesterday to include urinary urgency, frequency, hematuria, and left back flank pain. From June 2018- Oct 2018 the patient was diagnosed with a UTI on four encounters. Patient has been taking AZO OTC that has provided mild relief. Patient denies any new sexual partners and husband has had a vasectomy.    Review of Systems  Constitutional: Positive for fatigue. Negative for chills, diaphoresis and fever.  HENT: Positive for congestion, rhinorrhea, sinus pressure and sore throat.        Patient reports mild tooth pain  Respiratory: Positive for cough. Negative for chest tightness, shortness of breath and wheezing.   Genitourinary: Positive for difficulty urinating, dysuria, flank pain, frequency, hematuria and urgency. Negative for vaginal bleeding, vaginal discharge and vaginal pain.       Objective:   Physical Exam  Constitutional: She appears well-developed and well-nourished.  HENT:  Ears: BIL tympanic membrane retracted but no evidence of effusion or exudate.   Throat: Oropharynx mild erythema with cloudy post nasal drainage.    Neck:  Mild anterior cervical adenopathy   Cardiovascular: Normal rate, regular rhythm and normal heart sounds.  No CVA tenderness on examination  Pulmonary/Chest: Effort normal and breath sounds normal. She has no wheezes.  Abdominal: Soft. She exhibits no distension. There is no tenderness.    Vitals:   07/15/17 1538  BP: 116/76  Temp: 98  F (36.7 C)   Results for orders placed or performed in visit on 07/15/17  POCT urinalysis dipstick  Result Value Ref Range   Color, UA     Clarity, UA     Glucose, UA     Bilirubin, UA     Ketones, UA     Spec Grav, UA <=1.005 (A) 1.010 - 1.025   Blood, UA trace    pH, UA 5.0 5.0 - 8.0   Protein, UA     Urobilinogen, UA  0.2 or 1.0 E.U./dL   Nitrite, UA +    Leukocytes, UA Trace (A) Negative   Appearance     Odor    POCT UA - Microscopic Only  Result Value Ref Range   WBC, Ur, HPF, POC 0-1    RBC, urine, microscopic neg    Bacteria, U Microscopic many    Mucus, UA     Epithelial cells, urine per micros     Crystals, Ur, HPF, POC     Casts, Ur, LPF, POC     Yeast, UA             Assessment & Plan:   Problem List Items Addressed This Visit    None    Visit Diagnoses    Dysuria    -  Primary   Relevant Orders   POCT urinalysis dipstick   POCT urinalysis dipstick (Completed)   Bacterial upper respiratory infection          Meds ordered this encounter  Medications  . levofloxacin (LEVAQUIN) 500 MG tablet    Sig: Take 1 tablet (500 mg total) by  mouth daily.    Dispense:  10 tablet    Refill:  0    Order Specific Question:   Supervising Provider    Answer:   Mikey Kirschner [[id number]]   Plan : -Diagnostic tests: U/A and micro completed.  -Medication: Begin taking levaquin for UTI. Take AZO for 48 hours and then d/c.  -Education: drink plenty of fluids, continue antibiotic therapy until it is complete, and get plenty of rest.  -Referral: Urology, Pt plans to make appt with urology. Instructed to call if need assistance in scheduling an appt.  -Follow Up: Warning signs reviewed. Call back in 48 hours if no improvement, sooner if worse.

## 2017-07-16 ENCOUNTER — Encounter: Payer: Self-pay | Admitting: Nurse Practitioner

## 2017-08-02 MED FILL — CONTRAVE ER 8-90 MG TABLET: 8-90 | 30 days supply | Qty: 120 | Fill #2

## 2017-08-05 ENCOUNTER — Other Ambulatory Visit: Payer: Self-pay | Admitting: Nurse Practitioner

## 2017-08-05 ENCOUNTER — Telehealth: Payer: Self-pay | Admitting: Family Medicine

## 2017-08-05 DIAGNOSIS — R300 Dysuria: Secondary | ICD-10-CM

## 2017-08-05 NOTE — Progress Notes (Unsigned)
amb  

## 2017-08-05 NOTE — Telephone Encounter (Signed)
Referral placed in Epic.

## 2017-08-05 NOTE — Telephone Encounter (Signed)
Please initiate referral in system for urology so I may document details  Pt made her own appointment  Per Carolyn's recommendation however she has the [facility] plan and a referral is required

## 2017-08-31 MED FILL — CONTRAVE ER 8-90 MG TABLET: 8-90 | 30 days supply | Qty: 120 | Fill #3

## 2017-10-03 MED FILL — CONTRAVE ER 8-90 MG TABLET: 8-90 | 30 days supply | Qty: 120 | Fill #4

## 2017-11-03 ENCOUNTER — Ambulatory Visit (INDEPENDENT_AMBULATORY_CARE_PROVIDER_SITE_OTHER): Payer: No Typology Code available for payment source | Admitting: Family Medicine

## 2017-11-03 ENCOUNTER — Encounter: Payer: Self-pay | Admitting: Family Medicine

## 2017-11-03 VITALS — BP 122/88 | Ht 66.0 in | Wt 171.0 lb

## 2017-11-03 DIAGNOSIS — Z0000 Encounter for general adult medical examination without abnormal findings: Secondary | ICD-10-CM

## 2017-11-03 MED ORDER — NALTREXONE-BUPROPION HCL ER 8-90 MG PO TB12: ORAL_TABLET | ORAL | 5 refills | Status: DC

## 2017-11-03 MED FILL — CONTRAVE ER 8-90 MG TABLET: 8-90 | 30 days supply | Qty: 120 | Fill #5

## 2017-11-03 NOTE — Progress Notes (Signed)
   Subjective:    Patient ID: Tanya Sanders, female    DOB: 03-26-1986, 32 y.o.   MRN: [id number]  HPI   Patient is here today for a six month follow up to follow up on pos.She states she is also here today to follow up on contrave. She states she has been on it for a year.  Imp fasting glu wih  preg  contrave takes pretty faithfully  Five go six times per wk  wights and now has lost weight   No colon cancer hs'  gmo sibling long time ago   n   Hx close in the family   No   The patient comes in today for a wellness visit.    A review of their health history was completed.  A review of medications was also completed.  Any needed refills;   Eating habits: health conscious  Falls/  MVA accidents in past few months: none  Regular exercise: cardio and weights 5 -6 days per week  Specialist pt sees on regular basis: none  Preventative health issues were discussed.   Additional concerns: none   Review of Systems  Constitutional: Negative for activity change, appetite change and fatigue.  HENT: Negative for congestion and rhinorrhea.   Eyes: Negative for discharge.  Respiratory: Negative for cough, chest tightness and wheezing.   Cardiovascular: Negative for chest pain.  Gastrointestinal: Negative for abdominal pain, blood in stool and vomiting.  Endocrine: Negative for polyphagia.  Genitourinary: Negative for difficulty urinating and frequency.  Musculoskeletal: Negative for neck pain.  Skin: Negative for color change.  Allergic/Immunologic: Negative for environmental allergies and food allergies.  Neurological: Negative for weakness and headaches.  Psychiatric/Behavioral: Negative for agitation and behavioral problems.  All other systems reviewed and are negative.      Objective:   Physical Exam  Constitutional: She is oriented to person, place, and time. She appears well-developed and well-nourished.  HENT:  Head: Normocephalic and atraumatic.    Right Ear: External ear normal.  Left Ear: External ear normal.  Eyes: Right eye exhibits no discharge. Left eye exhibits no discharge.  Neck: Normal range of motion. No tracheal deviation present.  Cardiovascular: Normal rate, regular rhythm, normal heart sounds and intact distal pulses. Exam reveals no gallop.  No murmur heard. Pulmonary/Chest: Effort normal and breath sounds normal. No stridor. No respiratory distress. She has no wheezes. She has no rales.  Abdominal: Soft. Bowel sounds are normal. She exhibits no distension and no mass. There is no tenderness. There is no rebound and no guarding.  Musculoskeletal: Normal range of motion. She exhibits no edema or tenderness.  Lymphadenopathy:    She has no cervical adenopathy.  Neurological: She is alert and oriented to person, place, and time. She exhibits normal muscle tone.  Skin: Skin is warm and dry.  Psychiatric: She has a normal mood and affect. Her behavior is normal.          Assessment & Plan:  Impression wellness exam.  Diet discussed.  Exercise discussed.  History of PCOS discussed.  Ongoing use of Contrave.  Has helped patient lose substantial weight.  Tolerating it well with no obvious side effects.  Uses injunction with a good approach to diet and exercise.  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  To maintain the Contrave.  Receives GYN care elsewhere

## 2017-11-07 ENCOUNTER — Encounter: Payer: Self-pay | Admitting: Family Medicine

## 2017-11-12 LAB — BASIC METABOLIC PANEL
BUN/Creatinine Ratio: 18 (ref 9–23)
BUN: 16 mg/dL (ref 6–20)
CO2: 22 mmol/L (ref 20–29)
Calcium: 9.4 mg/dL (ref 8.7–10.2)
Chloride: 106 mmol/L (ref 96–106)
Creatinine, Ser: 0.88 mg/dL (ref 0.57–1.00)
GFR calc Af Amer: 101 mL/min/{1.73_m2} (ref >59)
GFR calc non Af Amer: 87 mL/min/{1.73_m2} (ref >59)
GLUCOSE: 89 mg/dL (ref 65–99)
POTASSIUM: 5.1 mmol/L (ref 3.5–5.2)
SODIUM: 140 mmol/L (ref 134–144)

## 2017-11-12 LAB — HEPATIC FUNCTION PANEL
ALK PHOS: 50 IU/L (ref 39–117)
ALT: 53 IU/L — AB (ref 0–32)
AST: 39 IU/L (ref 0–40)
Albumin: 4.3 g/dL (ref 3.5–5.5)
Bilirubin Total: 0.5 mg/dL (ref 0.0–1.2)
Bilirubin, Direct: 0.13 mg/dL (ref 0.00–0.40)
Total Protein: 6.4 g/dL (ref 6.0–8.5)

## 2017-11-12 LAB — LIPID PANEL
CHOLESTEROL TOTAL: 176 mg/dL (ref 100–199)
Chol/HDL Ratio: 3.1 ratio (ref 0.0–4.4)
HDL: 56 mg/dL (ref >39)
LDL Calculated: 107 mg/dL — ABNORMAL HIGH (ref 0–99)
TRIGLYCERIDES: 63 mg/dL (ref 0–149)
VLDL CHOLESTEROL CAL: 13 mg/dL (ref 5–40)

## 2017-11-21 ENCOUNTER — Encounter: Payer: Self-pay | Admitting: Family Medicine

## 2017-11-27 ENCOUNTER — Telehealth: Payer: 59 | Admitting: Family

## 2017-11-27 NOTE — Progress Notes (Signed)
Based on what you shared with me it looks like you have a serious condition that should be evaluated in a face to face office visit.  I am sorry but we can not treat children through an Evisit.   NOTE: If you entered your credit card information for this eVisit, you will not be charged. You may see a "hold" on your card for the $30 but that hold will drop off and you will not have a charge processed.  If you are having a true medical emergency please call 911.  If you need an urgent face to face visit, [place] has four urgent care centers for your convenience.  If you need care fast and have a high deductible or no insurance consider:   [email or web address] to reserve your spot online an avoid wait times  [facility] [address], [address] [place], [place] [address] 8 am to 8 pm Monday-Friday 10 am to 4 pm Saturday-Sunday *Across the street from [employer]  [place], [address] 8 am to 5 pm Monday-Friday * In the [facility] on the [facility]   The following sites will take your  insurance:  . [facility] [place] a Provider at this Location  [address] [place], [place] [address] . 10 am to 8 pm Monday-Friday . 12 pm to 8 pm Saturday-Sunday   . [facility] at [place] a Provider at this Location  [address], [place], [place] [address] . 8 am to 8 pm Monday-Friday . 9 am to 6 pm Saturday . 11 am to 6 pm Sunday   . [facility] at [place] Get Driving Directions  [address] [place], [place] [address] . 8 am to 8 pm Monday-Friday . 8 am to 4 pm Saturday-Sunday   Your e-visit answers were reviewed by a board certified advanced clinical practitioner to complete your personal care plan.  Thank you for using  e-Visits.

## 2017-12-05 ENCOUNTER — Encounter: Payer: Self-pay | Admitting: Family Medicine

## 2017-12-05 ENCOUNTER — Telehealth: Payer: No Typology Code available for payment source | Admitting: Family

## 2017-12-05 DIAGNOSIS — B9689 Other specified bacterial agents as the cause of diseases classified elsewhere: Secondary | ICD-10-CM | POA: Diagnosis not present

## 2017-12-05 DIAGNOSIS — J329 Chronic sinusitis, unspecified: Secondary | ICD-10-CM | POA: Diagnosis not present

## 2017-12-05 MED ORDER — DOXYCYCLINE HYCLATE 100 MG PO TABS: 100.0000 mg | ORAL_TABLET | Freq: Two times a day (BID) | ORAL | 0 refills | Status: DC

## 2017-12-05 MED FILL — CONTRAVE ER 8-90 MG TABLET: 8-90 | 30 days supply | Qty: 120 | Fill #0

## 2017-12-05 MED FILL — DOXYCYCLINE HYCLATE 100 MG: 100 | 10 days supply | Qty: 20 | Fill #0

## 2017-12-05 NOTE — Progress Notes (Signed)

## 2018-01-02 MED FILL — CONTRAVE ER 8-90 MG TABLET: 8-90 | 30 days supply | Qty: 120 | Fill #1

## 2018-02-08 ENCOUNTER — Encounter: Payer: Self-pay | Admitting: Family Medicine

## 2018-02-08 ENCOUNTER — Other Ambulatory Visit: Payer: Self-pay | Admitting: *Deleted

## 2018-02-10 ENCOUNTER — Telehealth: Payer: No Typology Code available for payment source | Admitting: Family

## 2018-02-10 DIAGNOSIS — N390 Urinary tract infection, site not specified: Secondary | ICD-10-CM

## 2018-02-10 MED ORDER — NITROFURANTOIN MONOHYD MACRO 100 MG PO CAPS: 100.0000 mg | ORAL_CAPSULE | Freq: Two times a day (BID) | ORAL | 0 refills | Status: DC

## 2018-02-10 NOTE — Progress Notes (Signed)

## 2018-02-15 ENCOUNTER — Encounter: Payer: Self-pay | Admitting: Family Medicine

## 2018-02-16 ENCOUNTER — Telehealth: Payer: Self-pay | Admitting: *Deleted

## 2018-02-16 ENCOUNTER — Ambulatory Visit (INDEPENDENT_AMBULATORY_CARE_PROVIDER_SITE_OTHER): Payer: No Typology Code available for payment source | Admitting: Family Medicine

## 2018-02-16 VITALS — BP 124/72 | HR 78

## 2018-02-16 DIAGNOSIS — M9251 Juvenile osteochondrosis of tibia and fibula, right leg: Secondary | ICD-10-CM | POA: Diagnosis not present

## 2018-02-16 DIAGNOSIS — M92521 Juvenile osteochondrosis of tibia tubercle, right leg: Secondary | ICD-10-CM

## 2018-02-16 NOTE — Progress Notes (Signed)
Pt having osgood schlatters of the knee. Doing p t thru the p t on her own. They have done leg extendion aexcise, and also doing  Not much better with it, concerned about posibilty of tendon rupture  PR rec getting imaging  Has had discomfort off and on since childnhood worse with exrcisee   Getting frustrated  Leg actually felt weaker but PT said strength actually intact   Day [phone]  No headache, no major weight loss or weight gain, no chest pain no back pain abdominal pain no change in bowel habits complete ROS otherwise negative  Vital signs stable  Alert vitals stable, NAD. Blood pressure good on repeat. HEENT normal. Lungs clear. Heart regular rate and rhythm. Right knee no joint laxity no effusion no joint line tenderness.  Impressive anterior  tibial tubercle prominence mild tenderness  Impression chronic Osgood-Schlatter's disease.  Patient frustrated by this.  Impact on her ability to do what she would like to do with exercise.  Orthopedic referral rationale discussed

## 2018-02-16 NOTE — Telephone Encounter (Signed)
Contrave weight loss medication was  denied by [employer] due to patient not losing 5 % of body weight during last months of treatment. Patient notified and verbalized understanding.

## 2018-02-17 ENCOUNTER — Encounter: Payer: Self-pay | Admitting: Family Medicine

## 2018-02-23 ENCOUNTER — Encounter: Payer: Self-pay | Admitting: Family Medicine

## 2018-03-06 ENCOUNTER — Encounter: Payer: Self-pay | Admitting: Family Medicine

## 2018-03-06 MED ORDER — BUPROPION HCL ER (SR) 150 MG PO TB12: ORAL_TABLET | ORAL | 1 refills | Status: DC

## 2018-03-06 MED FILL — BUPROPION SR 150 MG TABLET: 150 | 45 days supply | Qty: 90 | Fill #0

## 2018-03-15 ENCOUNTER — Encounter: Payer: Self-pay | Admitting: *Deleted

## 2018-05-03 ENCOUNTER — Ambulatory Visit (INDEPENDENT_AMBULATORY_CARE_PROVIDER_SITE_OTHER): Payer: No Typology Code available for payment source | Admitting: Family Medicine

## 2018-05-03 ENCOUNTER — Encounter: Payer: Self-pay | Admitting: Family Medicine

## 2018-05-03 VITALS — BP 122/82 | Ht 65.0 in | Wt 184.2 lb

## 2018-05-03 DIAGNOSIS — E66811 Obesity, class 1: Secondary | ICD-10-CM

## 2018-05-03 DIAGNOSIS — E669 Obesity, unspecified: Secondary | ICD-10-CM

## 2018-05-03 MED ORDER — NALTREXONE-BUPROPION HCL ER 8-90 MG PO TB12: ORAL_TABLET | ORAL | 5 refills | Status: DC

## 2018-05-03 NOTE — Progress Notes (Signed)
   Subjective:    Patient ID: Tanya Sanders, adult    DOB: August 25, 1985, 32 y.o.   MRN: [id number]  HPI  Patient arrives and would like to restart contrave. Patient had to stop Contrave because insurance denied it with her BMI at 27. Patient has gained some of her weight back since stopping contrave and would like to restart.  Four to five times   Diet overall is goood,  Doe not notic in  cotrave was denied, not approved for maintennce puroose     contrave has orked in the past     Pt tried wellbutien alone but di not wnt to do that     Patient's weight now back up.  Leading to element of fatigue.  BMI now is over 30    Review of Systems No headache, no major weight loss or weight gain, no chest pain no back pain abdominal pain no change in bowel habits complete ROS otherwise negative     Objective:   Physical Exam Alert vitals stable, NAD. Blood pressure good on repeat. HEENT normal. Lungs clear. Heart regular rate and rhythm.        Assessment & Plan:  Impression obesity.  Substantial for patient.  BMI approaching 31.  Has responded nicely to Contrave in the past.  No major side effects.  Would like to resume.  Medication prescribed at standard dose since tolerating well in the past.  Follow-up in several months exercise diet discussed and encouraged

## 2018-05-16 ENCOUNTER — Telehealth: Payer: Self-pay | Admitting: *Deleted

## 2018-05-16 MED FILL — CONTRAVE ER 8-90 MG TABLET: 8-90 | 30 days supply | Qty: 120 | Fill #0

## 2018-05-16 NOTE — Telephone Encounter (Signed)
Contrave 8mg /90mg  has been approved by insurance from 05/11/18-05/11/19

## 2018-05-16 NOTE — Telephone Encounter (Signed)
Patient notified and verbalized understanding. 

## 2018-05-30 ENCOUNTER — Encounter: Payer: Self-pay | Admitting: Family Medicine

## 2018-05-30 ENCOUNTER — Telehealth: Payer: Self-pay | Admitting: Family Medicine

## 2018-05-30 MED ORDER — MAGIC MOUTHWASH: ORAL | 0 refills | Status: DC

## 2018-05-30 NOTE — Telephone Encounter (Signed)
Pt states pharmacy did not receive magic mouthwash SOLN   Pharmacy:  [facility], [place]. [address]

## 2018-05-30 NOTE — Telephone Encounter (Signed)
Pt also called to let us know pharm does not have rx and I refaxed rx and pt was notified.

## 2018-05-30 NOTE — Telephone Encounter (Signed)
rx faxed to pharm. Pt notified.

## 2018-06-29 ENCOUNTER — Encounter: Payer: Self-pay | Admitting: Family Medicine

## 2018-06-29 ENCOUNTER — Ambulatory Visit (INDEPENDENT_AMBULATORY_CARE_PROVIDER_SITE_OTHER): Payer: Self-pay | Admitting: Family Medicine

## 2018-06-29 VITALS — BP 130/70 | HR 92 | Temp 98.2°F | Resp 12 | Wt 182.8 lb

## 2018-06-29 DIAGNOSIS — R0981 Nasal congestion: Secondary | ICD-10-CM

## 2018-06-29 DIAGNOSIS — R6889 Other general symptoms and signs: Secondary | ICD-10-CM

## 2018-06-29 MED ORDER — OSELTAMIVIR PHOSPHATE 75 MG PO CAPS: 75.0000 mg | ORAL_CAPSULE | Freq: Two times a day (BID) | ORAL | 0 refills | Status: AC

## 2018-06-29 MED ORDER — AZELASTINE HCL 0.1 % NA SOLN: 1.0000 | Freq: Two times a day (BID) | NASAL | 0 refills | Status: DC

## 2018-06-29 MED FILL — OSELTAMIVIR PHOS 75 MG CAP: 75 | 5 days supply | Qty: 10 | Fill #0

## 2018-06-29 MED FILL — AZELASTINE HCL 137 MCG SPRY: 0.1 | 50 days supply | Qty: 30 | Fill #0

## 2018-06-29 NOTE — Progress Notes (Signed)
KELLI ROBECK is a 33 y.o. adult who presents today with 1 days of sinus congestion and headache, she has known sick contacts at work and at home and both her children are currently running a temperature. She has been using supportive care medications for sinus congestion and headache with some relief. She is otherwise healthy denies any chronic health conditions, denies any recent illness exacerbations or recent abx. Medication treatment.  Review of Systems  Constitutional: Positive for fever and malaise/fatigue. Negative for chills.  HENT: Positive for congestion and sinus pain. Negative for ear discharge, ear pain and sore throat.   Eyes: Negative.   Respiratory: Negative for cough, sputum production and shortness of breath.   Cardiovascular: Negative.  Negative for chest pain.  Gastrointestinal: Positive for diarrhea. Negative for abdominal pain, heartburn, nausea and vomiting.  Genitourinary: Negative for dysuria, frequency, hematuria and urgency.  Musculoskeletal: Positive for myalgias.  Skin: Negative.   Neurological: Negative for dizziness and headaches.  Endo/Heme/Allergies: Negative.   Psychiatric/Behavioral: Negative.     Zahara has a current medication list which includes the following prescription(s): azelastine, bupropion, doxycycline, levofloxacin, magic mouthwash, naltrexone-bupropion hcl er, naltrexone-bupropion hcl er, nitrofurantoin (macrocrystal-monohydrate), and oseltamivir. Also is allergic to phentermine; bactrim [sulfamethoxazole-trimethoprim]; penicillins; and sulfa antibiotics.  Tiffony  has a past medical history of Abscess of tonsil. Also  has a past surgical history that includes Cesarean section; Cholecystectomy (N/A, 09/22/2012); ERCP (N/A, 10/06/2012); sphincterotomy (N/A, 10/06/2012); removal of stones (N/A, 10/06/2012); Cesarean section (N/A, 01/31/2014); and Tonsillectomy (N/A, 06/03/2016).    O: Vitals:   06/29/18 1351  BP: 130/70  Pulse: 92  Resp: 12   Temp: 98.2 F (36.8 C)  SpO2: 100%     Physical Exam Vitals signs reviewed.  Constitutional:      General: She is not in acute distress.    Appearance: Normal appearance. She is well-developed. She is not ill-appearing, toxic-appearing or diaphoretic.  HENT:     Head: Normocephalic.     Right Ear: Hearing, tympanic membrane, ear canal and external ear normal.     Left Ear: Hearing, tympanic membrane, ear canal and external ear normal.     Nose: Congestion and rhinorrhea present.     Right Sinus: Maxillary sinus tenderness and frontal sinus tenderness present.     Left Sinus: Maxillary sinus tenderness and frontal sinus tenderness present.     Mouth/Throat:     Lips: Pink.     Mouth: Mucous membranes are moist.     Pharynx: Uvula midline.     Tonsils: No tonsillar exudate or tonsillar abscesses. Swelling: 0 on the right. 0 on the left.  Neck:     Musculoskeletal: Normal range of motion and neck supple.  Cardiovascular:     Rate and Rhythm: Normal rate and regular rhythm.     Pulses: Normal pulses.     Heart sounds: Normal heart sounds.  Pulmonary:     Effort: Pulmonary effort is normal.     Breath sounds: Normal breath sounds.  Abdominal:     General: Abdomen is flat. Bowel sounds are normal.     Palpations: Abdomen is soft.  Musculoskeletal: Normal range of motion.  Lymphadenopathy:     Head:     Right side of head: No submental or submandibular adenopathy.     Left side of head: No submental or submandibular adenopathy.     Cervical: No cervical adenopathy.  Neurological:     Mental Status: She is alert and oriented to person, place, and time.  Psychiatric:        Behavior: Behavior is cooperative.    A: 1. Flu-like symptoms   2. Sinus congestion    P: Will treat presumptively based on symptoms and known close contact with positive flu test. Mother/patient is primary care giver of 2 young children and a spouse who is pending surgery next week. Discussed ED  precautions, flu complications and reasons to follow up. Will provide antiviral as well as supportive care patient does not appear toxic today and should recover well with treatment based on physical exam today. VSS and patient in NAD>   1. Flu-like symptoms - oseltamivir (TAMIFLU) 75 MG capsule; Take 1 capsule (75 mg total) by mouth 2 (two) times daily for 5 days.  2. Sinus congestion - azelastine (ASTELIN) 0.1 % nasal spray; Place 1 spray into both nostrils 2 (two) times daily. Use in each nostril as directed   - azelastine (ASTELIN) 0.1 % nasal spray; Place 1 spray into both nostrils 2 (two) times daily. Use in each nostril as directed - oseltamivir (TAMIFLU) 75 MG capsule; Take 1 capsule (75 mg total) by mouth 2 (two) times daily for 5 days.   Discussed with patient exam findings, suspected diagnosis etiology and  reviewed recommended treatment plan and follow up, including complications and indications for urgent medical follow up and evaluation. Medications including use and indications reviewed with patient. Patient provided relevant patient education on diagnosis and/or relevant related condition that were discussed and reviewed with patient at discharge. Patient verbalized understanding of information provided and agrees with plan of care (POC), all questions answered.

## 2018-06-29 NOTE — Patient Instructions (Signed)

## 2018-06-29 NOTE — Telephone Encounter (Signed)
This message was copied into pt's chart and sent to dr scott.

## 2018-07-04 ENCOUNTER — Encounter: Payer: No Typology Code available for payment source | Admitting: Obstetrics & Gynecology

## 2018-07-05 MED FILL — CONTRAVE ER 8-90 MG TABLET: 8-90 | 30 days supply | Qty: 120 | Fill #1

## 2018-07-07 ENCOUNTER — Telehealth: Payer: No Typology Code available for payment source | Admitting: Family

## 2018-07-07 DIAGNOSIS — J029 Acute pharyngitis, unspecified: Secondary | ICD-10-CM

## 2018-07-07 MED ORDER — AZITHROMYCIN 250 MG PO TABS: ORAL_TABLET | ORAL | 0 refills | Status: DC

## 2018-07-07 MED FILL — AZITHROMYCIN 250 MG TABLET: 250 | 5 days supply | Qty: 6 | Fill #0

## 2018-07-07 NOTE — Progress Notes (Signed)
We are sorry that you are not feeling well.  Here is how we plan to help!  Based on what you have shared with me it is likely that you have strep pharyngitis.  Strep pharyngitis is inflammation and infection in the back of the throat.  This is an infection cause by bacteria and is treated with antibiotics.  I have prescribed Azithromycin 250 mg two tablets today and then one daily for 4 additional days. For throat pain, we recommend over the counter oral pain relief medications such as acetaminophen or aspirin, or anti-inflammatory medications such as ibuprofen or naproxen sodium. Topical treatments such as oral throat lozenges or sprays may be used as needed. Strep infections are not as easily transmitted as other respiratory infections, however we still recommend that you avoid close contact with loved ones, especially the very young and elderly.  Remember to wash your hands thoroughly throughout the day as this is the number one way to prevent the spread of infection and wipe down door knobs and counters with disinfectant.  I have spent apprx 5 mins documenting and reviewing patient's chart.   Home Care:  Only take medications as instructed by your medical team.  Complete the entire course of an antibiotic.  Do not take these medications with alcohol.  A steam or ultrasonic humidifier can help congestion.  You can place a towel over your head and breathe in the steam from hot water coming from a faucet.  Avoid close contacts especially the very young and the elderly.  Cover your mouth when you cough or sneeze.  Always remember to wash your hands.  Get Help Right Away If:  You develop worsening fever or sinus pain.  You develop a severe head ache or visual changes.  Your symptoms persist after you have completed your treatment plan.  Make sure you  Understand these instructions.  Will watch your condition.  Will get help right away if you are not doing well or get worse.  Your  e-visit answers were reviewed by a board certified advanced clinical practitioner to complete your personal care plan.  Depending on the condition, your plan could have included both over the counter or prescription medications.  If there is a problem please reply  once you have received a response from your provider.  Your safety is important to [place].  If you have drug allergies check your prescription carefully.    You can use MyChart to ask questions about today's visit, request a non-urgent call back, or ask for a work or school excuse for 24 hours related to this e-Visit. If it has been greater than 24 hours you will need to follow up with your provider, or enter a new e-Visit to address those concerns.  You will get an e-mail in the next two days asking about your experience.  I hope that your e-visit has been valuable and will speed your recovery. Thank you for using e-visits.

## 2018-07-11 ENCOUNTER — Encounter: Payer: No Typology Code available for payment source | Admitting: Obstetrics & Gynecology

## 2018-07-27 MED FILL — CONTRAVE ER 8-90 MG TABLET: 8-90 | 30 days supply | Qty: 120 | Fill #2 | Status: TO

## 2018-08-02 ENCOUNTER — Ambulatory Visit: Payer: No Typology Code available for payment source | Admitting: Family Medicine

## 2018-08-14 ENCOUNTER — Telehealth: Payer: Self-pay | Admitting: Family Medicine

## 2018-08-23 MED FILL — CONTRAVE ER 8-90 MG TABLET: 8-90 | 30 days supply | Qty: 120 | Fill #0

## 2018-08-25 ENCOUNTER — Ambulatory Visit: Payer: No Typology Code available for payment source | Admitting: Family Medicine

## 2018-09-11 ENCOUNTER — Other Ambulatory Visit: Payer: Self-pay

## 2018-09-14 ENCOUNTER — Encounter: Payer: Self-pay | Admitting: Obstetrics & Gynecology

## 2018-09-14 ENCOUNTER — Other Ambulatory Visit: Payer: Self-pay

## 2018-09-14 ENCOUNTER — Ambulatory Visit (INDEPENDENT_AMBULATORY_CARE_PROVIDER_SITE_OTHER): Payer: No Typology Code available for payment source | Admitting: Obstetrics & Gynecology

## 2018-09-14 VITALS — BP 124/78 | Ht 65.5 in | Wt 184.0 lb

## 2018-09-14 DIAGNOSIS — Z1151 Encounter for screening for human papillomavirus (HPV): Secondary | ICD-10-CM

## 2018-09-14 DIAGNOSIS — Z01419 Encounter for gynecological examination (general) (routine) without abnormal findings: Secondary | ICD-10-CM | POA: Diagnosis not present

## 2018-09-14 DIAGNOSIS — E669 Obesity, unspecified: Secondary | ICD-10-CM

## 2018-09-14 DIAGNOSIS — Z9189 Other specified personal risk factors, not elsewhere classified: Secondary | ICD-10-CM | POA: Diagnosis not present

## 2018-09-14 NOTE — Patient Instructions (Signed)
1. Encounter for routine gynecological examination with Papanicolaou smear of cervix Normal gynecologic exam.  Pap with high-risk HPV done today.  Breast exam normal.  2. Relies on partner vasectomy for contraception  3. Obesity (BMI 30.0-34.9) Body mass index at 30.15, classified as lower end of obesity, but patient is in good fitness with regular aerobic activities and weightlifting.  Probably has a good muscle mass.  Recommend percentage of fat for better evaluation of weight loss that would be indicated.  Recommend a lower calorie/carb diet such as [employer].  Continue with fitness.  Tanya Sanders, it was a pleasure seeing you today!  I will inform you of your results as soon as they are available.

## 2018-09-14 NOTE — Progress Notes (Signed)
Tanya Sanders 1986/03/20 [id number]   History:    33 y.o. G2P2L2 Married.  Vasectomy.  Daughter 62 yo, son 92 yo.  RP:  Established patient presenting for annual gyn exam   HPI: Menstrual periods every month with normal flow.  No breakthrough bleeding.  No pelvic pain.  No pain with intercourse.  Normal vaginal secretions.  Urine and bowel movements normal.  Breast normal.  Body mass index 30.15.  Good muscle mass.  Patient is fit with aerobic activities regularly and weightlifting 2-3 times a week.  Seen by family physician last year.  Past medical history,surgical history, family history and social history were all reviewed and documented in the EPIC chart.  Gynecologic History Patient's last menstrual period was 09/10/2018. Contraception: vasectomy Last Pap: 03/2016. Results were: Negative/HPV HR neg Last mammogram: Never Bone Density: Never Colonoscopy: Never  Obstetric History OB History  Gravida Para Term Preterm AB Living  2 2 1     2   SAB TAB Ectopic Multiple Live Births          1    # Outcome Date GA Lbr Len/2nd Weight Sex Delivery Anes PTL Lv  2 Para 01/31/14   7 lb 15 oz (3.6 kg) M CS-LTranv Spinal  LIV  1 Term              ROS: A ROS was performed and pertinent positives and negatives are included in the history.  GENERAL: No fevers or chills. HEENT: No change in vision, no earache, sore throat or sinus congestion. NECK: No pain or stiffness. CARDIOVASCULAR: No chest pain or pressure. No palpitations. PULMONARY: No shortness of breath, cough or wheeze. GASTROINTESTINAL: No abdominal pain, nausea, vomiting or diarrhea, melena or bright red blood per rectum. GENITOURINARY: No urinary frequency, urgency, hesitancy or dysuria. MUSCULOSKELETAL: No joint or muscle pain, no back pain, no recent trauma. DERMATOLOGIC: No rash, no itching, no lesions. ENDOCRINE: No polyuria, polydipsia, no heat or cold intolerance. No recent change in weight. HEMATOLOGICAL: No anemia or  easy bruising or bleeding. NEUROLOGIC: No headache, seizures, numbness, tingling or weakness. PSYCHIATRIC: No depression, no loss of interest in normal activity or change in sleep pattern.     Exam:   BP 124/78   Ht 5' 5.5" (1.664 m)   Wt 184 lb (83.5 kg)   LMP 09/10/2018 Comment: no birth control  BMI 30.15 kg/m   Body mass index is 30.15 kg/m.  General appearance : Well developed well nourished female. No acute distress HEENT: Eyes: no retinal hemorrhage or exudates,  Neck supple, trachea midline, no carotid bruits, no thyroidmegaly Lungs: Clear to auscultation, no rhonchi or wheezes, or rib retractions  Heart: Regular rate and rhythm, no murmurs or gallops Breast:Examined in sitting and supine position were symmetrical in appearance, no palpable masses or tenderness,  no skin retraction, no nipple inversion, no nipple discharge, no skin discoloration, no axillary or supraclavicular lymphadenopathy Abdomen: no palpable masses or tenderness, no rebound or guarding Extremities: no edema or skin discoloration or tenderness  Pelvic: Vulva: Normal             Vagina: No gross lesions or discharge  Cervix: No gross lesions or discharge.  Pap/HPV HR done.  Uterus  AV, normal size, shape and consistency, non-tender and mobile  Adnexa  Without masses or tenderness  Anus: Normal   Assessment/Plan:  33 y.o. female for annual exam   1. Encounter for routine gynecological examination with Papanicolaou smear of cervix Normal gynecologic  exam.  Pap with high-risk HPV done today.  Breast exam normal.  2. Relies on partner vasectomy for contraception  3. Obesity (BMI 30.0-34.9) Body mass index at 30.15, classified as lower end of obesity, but patient is in good fitness with regular aerobic activities and weightlifting.  Probably has a good muscle mass.  Recommend percentage of fat for better evaluation of weight loss that would be indicated.  Recommend a lower calorie/carb diet such as [employer].  Continue with fitness.  Princess Bruins MD, 8:10 AM 09/14/2018

## 2018-09-14 NOTE — Addendum Note (Signed)
Addended by: Thurnell Garbe A on: 09/14/2018 08:32 AM   Modules accepted: Orders

## 2018-09-15 ENCOUNTER — Encounter: Payer: Self-pay | Admitting: *Deleted

## 2018-09-15 LAB — PAP, TP IMAGING W/ HPV RNA, RFLX HPV TYPE 16,18/45: HPV DNA High Risk: NOT DETECTED

## 2018-09-29 MED FILL — CONTRAVE ER 8-90 MG TABLET: 8-90 | 30 days supply | Qty: 120 | Fill #1

## 2018-10-17 ENCOUNTER — Encounter: Payer: Self-pay | Admitting: Family Medicine

## 2018-10-17 NOTE — Telephone Encounter (Signed)
Called pt and scheduled her for an appt for tomorrow since no available appts for today

## 2018-10-18 ENCOUNTER — Other Ambulatory Visit: Payer: Self-pay

## 2018-10-18 ENCOUNTER — Ambulatory Visit (INDEPENDENT_AMBULATORY_CARE_PROVIDER_SITE_OTHER): Payer: No Typology Code available for payment source | Admitting: Family Medicine

## 2018-10-18 ENCOUNTER — Encounter: Payer: Self-pay | Admitting: Family Medicine

## 2018-10-18 DIAGNOSIS — N3000 Acute cystitis without hematuria: Secondary | ICD-10-CM

## 2018-10-18 MED ORDER — NITROFURANTOIN MONOHYD MACRO 100 MG PO CAPS: 100.0000 mg | ORAL_CAPSULE | Freq: Two times a day (BID) | ORAL | 0 refills | Status: DC

## 2018-10-18 NOTE — Progress Notes (Signed)
   Subjective:    Patient ID: Tanya Sanders, adult    DOB: Oct 10, 1985, 34 y.o.   MRN: [id number] Audio plus visual HPI  Patient call with dysuria and frequency since yesterday. No fever. Patient states she has a history of UTIs.  Virtual Visit via Video Note  I connected with [name] on 10/18/18 at 11:30 AM EDT by a video enabled telemedicine application and verified that I am speaking with the correct person using two identifiers.  Location: Patient: home Provider: office   I discussed the limitations of evaluation and management by telemedicine and the availability of in person appointments. The patient expressed understanding and agreed to proceed.  History of Present Illness:    Observations/Objective:   Assessment and Plan:   Follow Up Instructions:    I discussed the assessment and treatment plan with the patient. The patient was provided an opportunity to ask questions and all were answered. The patient agreed with the plan and demonstrated an understanding of the instructions.   The patient was advised to call back or seek an in-person evaluation if the symptoms worsen or if the condition fails to improve as anticipated.  I provided 15 minutes of non-face-to-face time during this encounter.  Patient gets 2 or 3 urinary tract infections per year.  Generally just limited to cystitis.  Has seen a urologist in the past they had no major recommendations.  No major systemic features.  No vomiting no nausea no fever    Review of Systems See above    Objective:   Physical Exam  Virtual visit      Assessment & Plan:  Impression probable urinary tract infection.  Discussed.  Will prescribe antibiotics.  Hold off on culture.  Symptom care discussed

## 2018-10-26 ENCOUNTER — Encounter: Payer: Self-pay | Admitting: Family Medicine

## 2018-10-27 MED ORDER — NALTREXONE-BUPROPION HCL ER 8-90 MG PO TB12: ORAL_TABLET | ORAL | 3 refills | Status: DC

## 2018-10-30 MED FILL — CONTRAVE ER 8-90 MG TABLET: 8-90 | 30 days supply | Qty: 120 | Fill #0

## 2018-11-30 MED FILL — CONTRAVE ER 8-90 MG TABLET: 8-90 | 30 days supply | Qty: 120 | Fill #1

## 2018-12-19 ENCOUNTER — Ambulatory Visit
Admission: EM | Admit: 2018-12-19 | Discharge: 2018-12-19 | Disposition: A | Discharge status: Home / Self Care | Payer: No Typology Code available for payment source | Attending: Emergency Medicine | Admitting: Emergency Medicine

## 2018-12-19 ENCOUNTER — Other Ambulatory Visit: Payer: Self-pay

## 2018-12-19 DIAGNOSIS — Z3202 Encounter for pregnancy test, result negative: Secondary | ICD-10-CM | POA: Diagnosis not present

## 2018-12-19 DIAGNOSIS — R350 Frequency of micturition: Secondary | ICD-10-CM

## 2018-12-19 DIAGNOSIS — N3001 Acute cystitis with hematuria: Secondary | ICD-10-CM | POA: Diagnosis present

## 2018-12-19 DIAGNOSIS — R300 Dysuria: Secondary | ICD-10-CM | POA: Diagnosis present

## 2018-12-19 LAB — POCT URINALYSIS DIP (MANUAL ENTRY)
Bilirubin, UA: NEGATIVE
Glucose, UA: 100 mg/dL — AB
Ketones, POC UA: NEGATIVE mg/dL
Nitrite, UA: POSITIVE — AB
Protein Ur, POC: 30 mg/dL — AB
Spec Grav, UA: 1.015 (ref 1.010–1.025)
Urobilinogen, UA: 2 E.U./dL — AB
pH, UA: 6.5 (ref 5.0–8.0)

## 2018-12-19 LAB — POCT URINE PREGNANCY: Preg Test, Ur: NEGATIVE

## 2018-12-19 MED ORDER — NITROFURANTOIN MONOHYD MACRO 100 MG PO CAPS: 100.0000 mg | ORAL_CAPSULE | Freq: Two times a day (BID) | ORAL | 0 refills | Status: DC

## 2018-12-19 NOTE — ED Provider Notes (Signed)
[facility]   CC: Burning with urination  SUBJECTIVE:  Tanya Sanders is a 33 y.o. adult who complains of urinary frequency, urgency and dysuria for the past 4 days.  Went to [employer] this past weekend, and states she did not drink a lot of water.  Also admits to sexual activity.  Denies concern for STDs.  Complains of some associated lower abdominal pressure, but denies pain.  Has tried OTC AZO with relief.  Symptoms are made worse with urination.  Admits to similar symptoms in the past.  Denies fever, chills, nausea, vomiting, abdominal pain, flank pain, abnormal vaginal discharge, vaginal odor, hematuria.    LMP: Patient's last menstrual period was 12/10/2018 (exact date).  ROS: As in HPI.  All other pertinent ROS negative.     Past Medical History:  Diagnosis Date  . Abscess of tonsil    Past Surgical History:  Procedure Laterality Date  . CESAREAN SECTION    . CESAREAN SECTION N/A 01/31/2014   Procedure: Repeat CESAREAN SECTION;  Surgeon: Princess Bruins, MD;  Location: [facility];  Service: Obstetrics;  Laterality: N/A;  EDD: 02/06/14  . CHOLECYSTECTOMY N/A 09/22/2012   Procedure: LAPAROSCOPIC CHOLECYSTECTOMY;  Surgeon: Scherry Ran, MD;  Location: [facility];  Service: General;  Laterality: N/A;  . ERCP N/A 10/06/2012   Procedure: ENDOSCOPIC RETROGRADE CHOLANGIOPANCREATOGRAPHY (ERCP);  Surgeon: Daneil Dolin, MD;  Location: [facility];  Service: Gastroenterology;  Laterality: N/A;  . REMOVAL OF STONES N/A 10/06/2012   Procedure: REMOVAL OF STONES;  Surgeon: Daneil Dolin, MD;  Location: [facility];  Service: Gastroenterology;  Laterality: N/A;  . SPHINCTEROTOMY N/A 10/06/2012   Procedure: SPHINCTEROTOMY;  Surgeon: Daneil Dolin, MD;  Location: [facility];  Service: Gastroenterology;  Laterality: N/A;  . TONSILLECTOMY N/A 06/03/2016   Procedure: TONSILLECTOMY;  Surgeon: Izora Gala, MD;  Location: [place];  Service: ENT;  Laterality: N/A;   Allergies  Allergen Reactions  .  Phentermine Itching  . Bactrim [Sulfamethoxazole-Trimethoprim] Rash  . Penicillins Rash  . Sulfa Antibiotics Rash   No current facility-administered medications on file prior to encounter.    Current Outpatient Medications on File Prior to Encounter  Medication Sig Dispense Refill  . Naltrexone-buPROPion HCl ER (CONTRAVE) 8-90 MG TB12 Take two tablets by mouth twice daily. BMI 30.6 120 tablet 3   Social History   Socioeconomic History  . Marital status: Single    Spouse name: Not on file  . Number of children: Not on file  . Years of education: Not on file  . Highest education level: Not on file  Occupational History  . Occupation: Programmer, multimedia: [employer]  . Financial resource strain: Not on file  . Food insecurity    Worry: Not on file    Inability: Not on file  . Transportation needs    Medical: Not on file    Non-medical: Not on file  Tobacco Use  . Smoking status: Former Smoker    Packs/day: 1.00    Years: 11.00    Pack years: 11.00    Types: Cigarettes    Start date: 10/26/2001    Quit date: 08/22/2012    Years since quitting: 6.3  . Smokeless tobacco: Never Used  Substance and Sexual Activity  . Alcohol use: Yes    Alcohol/week: 0.0 standard drinks    Comment: rare  . Drug use: No  . Sexual activity: Yes    Partners: Male  Comment: 1st intercourse- 39, partners- 13, married- 10 yrs  Lifestyle  . Physical activity    Days per week: Not on file    Minutes per session: Not on file  . Stress: Not on file  Relationships  . Social Herbalist on phone: Not on file    Gets together: Not on file    Attends religious service: Not on file    Active member of club or organization: Not on file    Attends meetings of clubs or organizations: Not on file    Relationship status: Not on file  . Intimate partner violence    Fear of current or ex partner: Not on file    Emotionally abused: Not on file    Physically abused: Not on  file    Forced sexual activity: Not on file  Other Topics Concern  . Not on file  Social History Narrative  . Not on file   Family History  Problem Relation Age of Onset  . Hypertension Father   . COPD Father     OBJECTIVE:  Vitals:   12/19/18 1114  BP: 120/80  Pulse: 73  Resp: 20  Temp: 97.9 F (36.6 C)  SpO2: 98%   General appearance: AOx3 in no acute distress HEENT: NCAT.  PERRL, EOMI grossly; Oropharynx clear.  Lungs: clear to auscultation bilaterally without adventitious breath sounds Heart: regular rate and rhythm.  Abdomen: soft; non-distended; no tenderness; bowel sounds present; no guarding Back: no CVA tenderness Extremities: no edema; symmetrical with no gross deformities Skin: warm and dry Neurologic: Ambulates from chair to exam table without difficulty Psychological: alert and cooperative; normal mood and affect  Labs Reviewed  POCT URINALYSIS DIP (MANUAL ENTRY) - Abnormal; Notable for the following components:      Result Value   Color, UA orange (*)    Clarity, UA cloudy (*)    Glucose, UA =100 (*)    Blood, UA trace-intact (*)    Protein Ur, POC =30 (*)    Urobilinogen, UA 2.0 (*)    Nitrite, UA Positive (*)    Leukocytes, UA Large (3+) (*)    All other components within normal limits  URINE CULTURE  POCT URINE PREGNANCY    ASSESSMENT & PLAN:  1. Acute cystitis with hematuria   2. Dysuria   3. Urinary frequency     Meds ordered this encounter  Medications  . nitrofurantoin, macrocrystal-monohydrate, (MACROBID) 100 MG capsule    Sig: Take 1 capsule (100 mg total) by mouth 2 (two) times daily.    Dispense:  10 capsule    Refill:  0    Order Specific Question:   Supervising Provider    Answer:   Raylene Everts [[id number]]   Urine culture sent.  We will call you with the results.   Push fluids and get plenty of rest.   Take antibiotic as directed and to completion Take AZO and as needed for symptomatic relief Follow up with PCP if  symptoms persists Return here or go to ER if you have any new or worsening symptoms such as fever, worsening abdominal pain, nausea/vomiting, flank pain, symptoms do not improve with antibiotic, etc...  Outlined signs and symptoms indicating need for more acute intervention. Patient verbalized understanding. After Visit Summary given.     Lestine Box, PA-C 12/19/18 1207

## 2018-12-19 NOTE — ED Triage Notes (Signed)
uti symptons began on Saturday , taking azo

## 2018-12-19 NOTE — Discharge Instructions (Addendum)
Urine culture sent.  We will call you with the results.   Push fluids and get plenty of rest.   Take antibiotic as directed and to completion Take AZO and as needed for symptomatic relief Follow up with PCP if symptoms persists Return here or go to ER if you have any new or worsening symptoms such as fever, worsening abdominal pain, nausea/vomiting, flank pain, symptoms do not improve with antibiotic, etc..Marland Kitchen

## 2018-12-20 ENCOUNTER — Ambulatory Visit: Payer: No Typology Code available for payment source | Admitting: Nurse Practitioner

## 2018-12-21 ENCOUNTER — Telehealth (HOSPITAL_COMMUNITY): Payer: Self-pay | Admitting: Emergency Medicine

## 2018-12-21 LAB — URINE CULTURE: Culture: 50000 — AB

## 2018-12-21 NOTE — Telephone Encounter (Signed)
Urine culture was positive for e coli and was given  macrobid at urgent care visit. Pt contacted and made aware, educated on completing antibiotic and to follow up if symptoms are persistent. Verbalized understanding.    

## 2019-01-05 MED FILL — CONTRAVE ER 8-90 MG TABLET: 8-90 | 30 days supply | Qty: 120 | Fill #2

## 2019-02-09 ENCOUNTER — Ambulatory Visit (INDEPENDENT_AMBULATORY_CARE_PROVIDER_SITE_OTHER): Payer: No Typology Code available for payment source | Admitting: Family Medicine

## 2019-02-09 DIAGNOSIS — N390 Urinary tract infection, site not specified: Secondary | ICD-10-CM | POA: Diagnosis not present

## 2019-02-09 MED ORDER — NITROFURANTOIN MONOHYD MACRO 100 MG PO CAPS: 100.0000 mg | ORAL_CAPSULE | Freq: Two times a day (BID) | ORAL | 1 refills | Status: DC

## 2019-02-09 MED ORDER — PHENAZOPYRIDINE HCL 200 MG PO TABS: 200.0000 mg | ORAL_TABLET | Freq: Three times a day (TID) | ORAL | 0 refills | Status: DC | PRN

## 2019-02-09 MED FILL — NITROFURANTOIN MONO-MCR 100: 100 | 7 days supply | Qty: 14 | Fill #0

## 2019-02-09 MED FILL — PHENAZOPYRIDINE 200 MG TAB: 200 | 7 days supply | Qty: 21 | Fill #0

## 2019-02-09 NOTE — Progress Notes (Signed)
   Subjective:  Audio only  Patient ID: Tanya Sanders, adult    DOB: 02-08-1986, 33 y.o.   MRN: [id number]  HPI  Patient calls with dysuria and frequency since last night. Patient states she felt it a little earlier in week but drank lots of water and it seemed to get better.   Virtual Visit via Video Note  I connected with Tanya Sanders on 02/09/19 at 11:00 AM EDT by a video enabled telemedicine application and verified that I am speaking with the correct person using two identifiers.  Location: Patient: home Provider: office   I discussed the limitations of evaluation and management by telemedicine and the availability of in person appointments. The patient expressed understanding and agreed to proceed.  History of Present Illness:    Observations/Objective:   Assessment and Plan:   Follow Up Instructions:    I discussed the assessment and treatment plan with the patient. The patient was provided an opportunity to ask questions and all were answered. The patient agreed with the plan and demonstrated an understanding of the instructions.   The patient was advised to call back or seek an in-person evaluation if the symptoms worsen or if the condition fails to improve as anticipated.  I provided 18 minutes of non-face-to-face time during this encounter.   Patient is experiencing recurrent UTIs.  Several of this year.  Several last year.  Of note did go see urologist last year.  Had a work-up.  They found no pathological etiology to her recurrences and recommended ongoing episodic treatment.  Patient not interested in prophylaxis  Review of Systems  No headache, no major weight loss or weight gain, no chest pain no back pain abdominal pain no change in bowel habits complete ROS otherwise negative     Objective:   Physical Exam   Virtual     Assessment & Plan:  Impression recurrent urinary tract infection.  Most recent culture reviewed.  Antibiotics  prescribed symptom care discussed warning signs discussed

## 2019-02-10 ENCOUNTER — Encounter: Payer: Self-pay | Admitting: Family Medicine

## 2019-02-15 MED FILL — CONTRAVE ER 8-90 MG TABLET: 8-90 | 30 days supply | Qty: 120 | Fill #3

## 2019-03-22 ENCOUNTER — Encounter: Payer: Self-pay | Admitting: Family Medicine

## 2019-03-23 MED ORDER — CONTRAVE 8-90 MG PO TB12: ORAL_TABLET | ORAL | 2 refills | Status: DC

## 2019-03-23 NOTE — Telephone Encounter (Signed)
Lets do three mo plz

## 2019-03-26 MED FILL — CONTRAVE ER 8-90 MG TABLET: 8-90 | 30 days supply | Qty: 120 | Fill #0

## 2019-04-30 MED FILL — CONTRAVE ER 8-90 MG TABLET: 8-90 | 30 days supply | Qty: 120 | Fill #1

## 2019-05-31 ENCOUNTER — Telehealth: Payer: Self-pay | Admitting: *Deleted

## 2019-05-31 NOTE — Telephone Encounter (Signed)
Submitted pa through cover my meds. Await decision.

## 2019-05-31 NOTE — Telephone Encounter (Signed)
Pa for contrave. I called to get pt's current weight so I could put in her bmi. Left message to return call.

## 2019-05-31 NOTE — Telephone Encounter (Signed)
Current weight 178 lbs

## 2019-06-05 ENCOUNTER — Encounter: Payer: Self-pay | Admitting: Family Medicine

## 2019-06-05 NOTE — Telephone Encounter (Signed)
See my chart message

## 2019-06-05 NOTE — Telephone Encounter (Signed)
Left message to return call 

## 2019-06-05 NOTE — Telephone Encounter (Signed)
Notify per pt her insur co refusing to cover and why

## 2019-06-05 NOTE — Telephone Encounter (Signed)
Fax from [employer] contrave 8mg  -90mg  is not approved. Renewal requires that you lost at least 5% of baseline body weight after 3 months of treatment on contrave. And pt had only lost 3% of baseline body weight.

## 2019-06-25 ENCOUNTER — Encounter: Payer: Self-pay | Admitting: Family Medicine

## 2019-06-26 ENCOUNTER — Ambulatory Visit: Payer: No Typology Code available for payment source | Attending: Internal Medicine

## 2019-06-26 ENCOUNTER — Telehealth: Payer: Self-pay | Admitting: Family Medicine

## 2019-06-26 NOTE — Telephone Encounter (Signed)
Form in dr steve's folder 

## 2019-06-26 NOTE — Telephone Encounter (Signed)
Pt dropped off employee accommodation form. Form placed in nurse box at nurse station.

## 2019-06-30 ENCOUNTER — Ambulatory Visit
Admission: EM | Admit: 2019-06-30 | Discharge: 2019-06-30 | Disposition: A | Discharge status: Home / Self Care | Payer: No Typology Code available for payment source | Attending: Emergency Medicine | Admitting: Emergency Medicine

## 2019-06-30 ENCOUNTER — Other Ambulatory Visit: Payer: Self-pay

## 2019-06-30 DIAGNOSIS — J069 Acute upper respiratory infection, unspecified: Secondary | ICD-10-CM | POA: Diagnosis not present

## 2019-06-30 DIAGNOSIS — J040 Acute laryngitis: Secondary | ICD-10-CM

## 2019-06-30 MED ORDER — BENZONATATE 100 MG PO CAPS: 100.0000 mg | ORAL_CAPSULE | Freq: Three times a day (TID) | ORAL | 0 refills | Status: DC

## 2019-06-30 MED ORDER — PREDNISONE 10 MG (21) PO TBPK: ORAL_TABLET | Freq: Every day | ORAL | 0 refills | Status: DC

## 2019-06-30 NOTE — ED Provider Notes (Signed)
[facility]   [id number] 06/30/19 Arrival Time: [id number]   CC: URI symptoms  SUBJECTIVE: History from: patient.  Tanya Sanders is a 34 y.o. adult who presents with nasal congestion, rhinorrhea, sore throat, SOB, productive cough with green/ yellow sputum, and hoarse voice x 4 days.  Denies sick exposure to COVID, flu or strep.  Denies recent travel.  Has tried OTC medications and taking a shower with mild relief.  Symptoms are made worse in the morning.  Reports previous symptoms in the past with laryngitis.   Denies fever, wheezing, sinus pain/ pressure, chest pain, nausea, vomiting, changes in bowel or bladder habits.    ROS: As per HPI.  All other pertinent ROS negative.     Past Medical History:  Diagnosis Date  . Abscess of tonsil    Past Surgical History:  Procedure Laterality Date  . CESAREAN SECTION    . CESAREAN SECTION N/A 01/31/2014   Procedure: Repeat CESAREAN SECTION;  Surgeon: Princess Bruins, MD;  Location: [facility];  Service: Obstetrics;  Laterality: N/A;  EDD: 02/06/14  . CHOLECYSTECTOMY N/A 09/22/2012   Procedure: LAPAROSCOPIC CHOLECYSTECTOMY;  Surgeon: Scherry Ran, MD;  Location: [facility];  Service: General;  Laterality: N/A;  . ERCP N/A 10/06/2012   Procedure: ENDOSCOPIC RETROGRADE CHOLANGIOPANCREATOGRAPHY (ERCP);  Surgeon: Daneil Dolin, MD;  Location: [facility];  Service: Gastroenterology;  Laterality: N/A;  . REMOVAL OF STONES N/A 10/06/2012   Procedure: REMOVAL OF STONES;  Surgeon: Daneil Dolin, MD;  Location: [facility];  Service: Gastroenterology;  Laterality: N/A;  . SPHINCTEROTOMY N/A 10/06/2012   Procedure: SPHINCTEROTOMY;  Surgeon: Daneil Dolin, MD;  Location: [facility];  Service: Gastroenterology;  Laterality: N/A;  . TONSILLECTOMY N/A 06/03/2016   Procedure: TONSILLECTOMY;  Surgeon: Izora Gala, MD;  Location: [facility];  Service: ENT;  Laterality: N/A;   Allergies  Allergen Reactions  . Phentermine Itching  . Bactrim  [Sulfamethoxazole-Trimethoprim] Rash  . Penicillins Rash  . Sulfa Antibiotics Rash   No current facility-administered medications on file prior to encounter.   No current outpatient medications on file prior to encounter.   Social History   Socioeconomic History  . Marital status: Single    Spouse name: Not on file  . Number of children: Not on file  . Years of education: Not on file  . Highest education level: Not on file  Occupational History  . Occupation: Programmer, multimedia: [employer]  Tobacco Use  . Smoking status: Former Smoker    Packs/day: 1.00    Years: 11.00    Pack years: 11.00    Types: Cigarettes    Start date: 10/26/2001    Quit date: 08/22/2012    Years since quitting: 6.8  . Smokeless tobacco: Never Used  Substance and Sexual Activity  . Alcohol use: Yes    Alcohol/week: 0.0 standard drinks    Comment: rare  . Drug use: No  . Sexual activity: Yes    Partners: Male    Comment: 1st intercourse- 68, partners- 29, married- 10 yrs  Other Topics Concern  . Not on file  Social History Narrative  . Not on file   Social Determinants of Health   Financial Resource Strain:   . Difficulty of Paying Living Expenses: Not on file  Food Insecurity:   . Worried About Charity fundraiser in the Last Year: Not on file  . Ran Out of Food in the Last Year: Not on file  Transportation  Needs:   . Lack of Transportation (Medical): Not on file  . Lack of Transportation (Non-Medical): Not on file  Physical Activity:   . Days of Exercise per Week: Not on file  . Minutes of Exercise per Session: Not on file  Stress:   . Feeling of Stress : Not on file  Social Connections:   . Frequency of Communication with Friends and Family: Not on file  . Frequency of Social Gatherings with Friends and Family: Not on file  . Attends Religious Services: Not on file  . Active Member of Clubs or Organizations: Not on file  . Attends Archivist Meetings: Not on file  .  Marital Status: Not on file  Intimate Partner Violence:   . Fear of Current or Ex-Partner: Not on file  . Emotionally Abused: Not on file  . Physically Abused: Not on file  . Sexually Abused: Not on file   Family History  Problem Relation Age of Onset  . Hypertension Father   . COPD Father     OBJECTIVE:  Vitals:   06/30/19 0837  BP: 128/73  Pulse: 92  Resp: 20  Temp: 98.4 F (36.9 C)  SpO2: 98%     General appearance: alert; appears fatigued, but nontoxic; speaking in full sentences and tolerating own secretions HEENT: NCAT; Ears: EACs clear, TMs pearly gray; Eyes: PERRL.  EOM grossly intact. Sinuses: nontender; Nose: nares patent without rhinorrhea, Throat: oropharynx clear, tonsils non erythematous or enlarged, uvula midline; voice mildly hoarse Neck: supple without LAD Lungs: unlabored respirations, symmetrical air entry; cough: absent; no respiratory distress; CTAB Heart: regular rate and rhythm.   Skin: warm and dry Psychological: alert and cooperative; normal mood and affect  ASSESSMENT & PLAN:  1. Viral URI with cough   2. Laryngitis     Meds ordered this encounter  Medications  . predniSONE (STERAPRED UNI-PAK 21 TAB) 10 MG (21) TBPK tablet    Sig: Take by mouth daily. Take 6 tabs by mouth daily  for 2 days, then 5 tabs for 2 days, then 4 tabs for 2 days, then 3 tabs for 2 days, 2 tabs for 2 days, then 1 tab by mouth daily for 2 days    Dispense:  42 tablet    Refill:  0    Order Specific Question:   Supervising Provider    Answer:   Raylene Everts [id number]  . benzonatate (TESSALON) 100 MG capsule    Sig: Take 1 capsule (100 mg total) by mouth every 8 (eight) hours.    Dispense:  21 capsule    Refill:  0    Order Specific Question:   Supervising Provider    Answer:   Raylene Everts [id number]   Get rest, and drink fluids We will hold off on chest x-ray today Prednisone prescribed take as directed and to completion Prescribed tessalone perles as  needed for cough.  Use throat lozenges, hot tea, honey, and avoid second-hand smoke  Follow up with PCP on Monday for recheck and to ensure symptoms are improving Call or go to the ED if you have any new or worsening symptoms such as fever, worsening cough, shortness of breath, chest tightness, chest pain, turning blue, changes in mental status, etc...   Reviewed expectations re: course of current medical issues. Questions answered. Outlined signs and symptoms indicating need for more acute intervention. Patient verbalized understanding. After Visit Summary given.         Lestine Box, PA-C 06/30/19  [id number]  

## 2019-06-30 NOTE — Discharge Instructions (Signed)
Get rest, and drink fluids We will hold off on chest x-ray today Prednisone prescribed take as directed and to completion Prescribed tessalone perles as needed for cough.  Use throat lozenges, hot tea, honey, and avoid second-hand smoke  Follow up with PCP on Monday for recheck and to ensure symptoms are improving Call or go to the ED if you have any new or worsening symptoms such as fever, worsening cough, shortness of breath, chest tightness, chest pain, turning blue, changes in mental status, etc..Marland Kitchen

## 2019-06-30 NOTE — ED Triage Notes (Signed)
Pt presents with c/o cough that began on Tuesday. Pt was tested for covid on Wednesday and was negative. Pt is also experiencing some mild sob

## 2019-07-02 ENCOUNTER — Encounter: Payer: Self-pay | Admitting: Family Medicine

## 2019-07-02 ENCOUNTER — Ambulatory Visit: Payer: No Typology Code available for payment source | Attending: Internal Medicine

## 2019-07-02 ENCOUNTER — Other Ambulatory Visit: Payer: Self-pay

## 2019-07-02 ENCOUNTER — Ambulatory Visit (INDEPENDENT_AMBULATORY_CARE_PROVIDER_SITE_OTHER): Payer: No Typology Code available for payment source | Admitting: Family Medicine

## 2019-07-02 DIAGNOSIS — Z20822 Contact with and (suspected) exposure to covid-19: Secondary | ICD-10-CM

## 2019-07-02 DIAGNOSIS — J329 Chronic sinusitis, unspecified: Secondary | ICD-10-CM | POA: Diagnosis not present

## 2019-07-02 DIAGNOSIS — B9689 Other specified bacterial agents as the cause of diseases classified elsewhere: Secondary | ICD-10-CM | POA: Diagnosis not present

## 2019-07-02 MED ORDER — AZITHROMYCIN 250 MG PO TABS: ORAL_TABLET | ORAL | 0 refills | Status: DC

## 2019-07-02 NOTE — Progress Notes (Signed)
   Subjective:    Patient ID: Tanya Sanders, adult    DOB: [birth year], 34 y.o.   MRN: [id number]  HPI   Patient calls for a follow up from recent Urgent care visit for cough, congestion and SOB.  Was tested for covid and was Negative 06/27/2019  Virtual Visit via Video Note  I connected with Tanya Sanders on 07/02/19 at 11:00 AM EST by a video enabled telemedicine application and verified that I am speaking with the correct person using two identifiers.  Location: Patient: home Provider: office   I discussed the limitations of evaluation and management by telemedicine and the availability of in person appointments. The patient expressed understanding and agreed to proceed.  History of Present Illness:    Observations/Objective:   Assessment and Plan:   Follow Up Instructions:    I discussed the assessment and treatment plan with the patient. The patient was provided an opportunity to ask questions and all were answered. The patient agreed with the plan and demonstrated an understanding of the instructions.   The patient was advised to call back or seek an in-person evaluation if the symptoms worsen or if the condition fails to improve as anticipated.  I provided 22 minutes of non-face-to-face time during this encounter.  covid test wed neg  tue felt hoarse  Got worse thru rthe day  Felt worse n worse  Seen in urgicare sat  Lungs good in urgicare  Steroids given for layringitis   Still sig sob pos prod cough   No fever but on tyl   husb sccratchy throat    Review of Systems See above    Objective:   Physical Exam  Virtual      Assessment & Plan:  Impression respiratory illness.  With congestion hoarseness substantial cough.  And persistent shortness of breath.  Negative Covid test done last week on day 2 of illness.  Cough now productive.  Seen in urgent care.  They brought up idea of adding antibiotics so we will do that today.  20%  chance of someone with the disease having a negative first test discussed.  Therefore advised a second test to look to ongoing symptoms.  Warning signs discussed

## 2019-07-02 NOTE — Telephone Encounter (Signed)
Per Dr Richardson Landry: Note reviewed. Z pk, remind pt that one out of five patients with covid have a negative first test, if symtoms persist or others become sick in family need to reconsider

## 2019-07-03 LAB — NOVEL CORONAVIRUS, NAA: SARS-CoV-2, NAA: NOT DETECTED

## 2019-07-10 DIAGNOSIS — Z029 Encounter for administrative examinations, unspecified: Secondary | ICD-10-CM

## 2019-08-06 MED FILL — NITROFURANTOIN MONO-MCR 100: 100 | 7 days supply | Qty: 14 | Fill #1

## 2019-08-28 ENCOUNTER — Other Ambulatory Visit: Payer: Self-pay

## 2019-08-28 ENCOUNTER — Encounter: Payer: Self-pay | Admitting: Family Medicine

## 2019-08-28 ENCOUNTER — Ambulatory Visit (INDEPENDENT_AMBULATORY_CARE_PROVIDER_SITE_OTHER): Payer: No Typology Code available for payment source | Admitting: Family Medicine

## 2019-08-28 VITALS — BP 118/78 | HR 84 | Temp 98.0°F | Ht 65.0 in | Wt 196.8 lb

## 2019-08-28 DIAGNOSIS — R300 Dysuria: Secondary | ICD-10-CM | POA: Diagnosis not present

## 2019-08-28 DIAGNOSIS — N3000 Acute cystitis without hematuria: Secondary | ICD-10-CM

## 2019-08-28 LAB — POCT URINALYSIS DIPSTICK
Spec Grav, UA: 1.015 (ref 1.010–1.025)
pH, UA: 7 (ref 5.0–8.0)

## 2019-08-28 MED ORDER — NITROFURANTOIN MONOHYD MACRO 100 MG PO CAPS: 100.0000 mg | ORAL_CAPSULE | Freq: Two times a day (BID) | ORAL | 1 refills | Status: DC

## 2019-08-28 NOTE — Telephone Encounter (Signed)
I called patient back and scheduled appt for today

## 2019-08-28 NOTE — Progress Notes (Signed)
Subjective:    Patient ID: Tanya Sanders, adult    DOB: [birth year], 34 y.o.   MRN: [id number]  Dysuria  This is a new problem. The current episode started today. Associated symptoms include frequency and urgency. Pertinent negatives include no chills, flank pain, hematuria, nausea or vomiting.   History of UTIs Pt stating woke up this AM feeling pain in lower abdomen, urgency and frequency.  No blood in urine.  Denies back pain, fever, n/v/d.  Has had h/o recurrent UTIs over last few yrs.  Has seen urolgoy and recommending to just make sure to fully empty bladder.  Getting uti about 3-4x per yr.  Has been holding urine longer than normal.     Review of Systems  Constitutional: Negative for chills and fever.  Cardiovascular: Negative for chest pain.  Gastrointestinal: Negative for abdominal pain, blood in stool, constipation, diarrhea, nausea and vomiting.  Genitourinary: Positive for dysuria, frequency and urgency. Negative for flank pain and hematuria.  Musculoskeletal: Negative for back pain.  Skin: Negative for rash.  Neurological: Negative for dizziness, numbness and headaches.   Results for orders placed or performed in visit on 08/28/19  POCT urinalysis dipstick  Result Value Ref Range   Color, UA     Clarity, UA     Glucose, UA     Bilirubin, UA     Ketones, UA     Spec Grav, UA 1.015 1.010 - 1.025   Blood, UA     pH, UA 7.0 5.0 - 8.0   Protein, UA     Urobilinogen, UA     Nitrite, UA     Leukocytes, UA Moderate (2+) (A) Negative   Appearance     Odor          Objective:   Physical Exam Vitals and nursing note reviewed.  Constitutional:      General: She is not in acute distress.    Appearance: Normal appearance.  HENT:     Head: Normocephalic and atraumatic.     Nose: Nose normal.     Mouth/Throat:     Mouth: Mucous membranes are moist.     Pharynx: Oropharynx is clear.  Eyes:     Extraocular Movements: Extraocular movements intact.   Conjunctiva/sclera: Conjunctivae normal.     Pupils: Pupils are equal, round, and reactive to light.  Cardiovascular:     Rate and Rhythm: Normal rate and regular rhythm.     Pulses: Normal pulses.     Heart sounds: Normal heart sounds.  Pulmonary:     Effort: Pulmonary effort is normal.     Breath sounds: Normal breath sounds. No wheezing, rhonchi or rales.  Abdominal:     General: Abdomen is flat. Bowel sounds are normal. There is no distension.     Palpations: Abdomen is soft. There is no mass.     Tenderness: There is abdominal tenderness (suprapubic). There is no guarding or rebound.  Musculoskeletal:        General: Normal range of motion.     Right lower leg: No edema.     Left lower leg: No edema.  Skin:    General: Skin is warm and dry.     Findings: No lesion or rash.  Neurological:     General: No focal deficit present.     Mental Status: She is alert and oriented to person, place, and time.  Psychiatric:        Mood and Affect: Mood normal.  Behavior: Behavior normal.        Assessment & Plan:   1. Acute cystitis without hematuria -reviewed urine micro and seeing bacteria. UA-showing LEs, no nitrites, or blood.  Sent for urine culture.  -gave macrobid for 7 days. F/u if not improving or worsening.  Pt in agreement.  - POCT urinalysis dipstick - Urine Culture - nitrofurantoin, macrocrystal-monohydrate, (MACROBID) 100 MG capsule; Take 1 capsule (100 mg total) by mouth 2 (two) times daily.  Dispense: 14 capsule; Refill: 1  2. Dysuria  - POCT urinalysis dipstick

## 2019-08-31 LAB — URINE CULTURE

## 2019-09-17 ENCOUNTER — Encounter: Payer: No Typology Code available for payment source | Admitting: Obstetrics & Gynecology

## 2019-10-08 MED ORDER — CEPHALEXIN 500 MG PO CAPS: 500.0000 mg | ORAL_CAPSULE | Freq: Two times a day (BID) | ORAL | 0 refills | Status: DC

## 2019-10-08 NOTE — Progress Notes (Addendum)
Pt still having symptoms after taking macrobid for UTI starting on 08/28/19.   Has a lot of antibiotic allergies.  Urine culture grew out E.coli.  Will call in keflex, small chance of cross reactivity with penicillins (less than 10%), very small chance and if she has rash or hives with this medication to stop it and take benadryl and call the office. Stop the macrobid and take keflex for 1 wk.   Thx,   Dr. Lovena Le

## 2019-10-08 NOTE — Addendum Note (Signed)
Addended by: Erven Colla on: 10/08/2019 09:41 AM   Modules accepted: Orders

## 2019-10-20 ENCOUNTER — Ambulatory Visit
Admission: EM | Admit: 2019-10-20 | Discharge: 2019-10-20 | Disposition: A | Discharge status: Home / Self Care | Payer: No Typology Code available for payment source | Attending: Emergency Medicine | Admitting: Emergency Medicine

## 2019-10-20 ENCOUNTER — Other Ambulatory Visit: Payer: Self-pay

## 2019-10-20 DIAGNOSIS — R300 Dysuria: Secondary | ICD-10-CM | POA: Diagnosis present

## 2019-10-20 LAB — POCT URINALYSIS DIP (MANUAL ENTRY)
Bilirubin, UA: NEGATIVE
Glucose, UA: NEGATIVE mg/dL
Nitrite, UA: NEGATIVE
Spec Grav, UA: >=1.03 — AB (ref 1.010–1.025)
Urobilinogen, UA: 0.2 E.U./dL
pH, UA: 5.5 (ref 5.0–8.0)

## 2019-10-20 LAB — POCT URINE PREGNANCY: Preg Test, Ur: NEGATIVE

## 2019-10-20 MED ORDER — NITROFURANTOIN MONOHYD MACRO 100 MG PO CAPS: 100.0000 mg | ORAL_CAPSULE | Freq: Two times a day (BID) | ORAL | 0 refills | Status: DC

## 2019-10-20 NOTE — ED Triage Notes (Signed)
Pt states she began having dysuria today. Completed antibiotics last week for uti

## 2019-10-20 NOTE — Discharge Instructions (Signed)
Urine without obvious signs of infection Urine culture sent.  We will call you with abnormal results.   Paper prescription attached.  If urine culture comes back positive for infection you may begin taking medication Push fluids and get plenty of rest.   Follow up with PCP if symptoms persists Return here or go to ER if you have any new or worsening symptoms such as fever, worsening abdominal pain, nausea/vomiting, flank pain, etc..Marland Kitchen

## 2019-10-20 NOTE — ED Provider Notes (Signed)
[facility]   CC: Burning with urination  SUBJECTIVE:  Tanya Sanders is a 34 y.o. adult who complains of urinary frequency, urgency and dysuria for the past 1 day.  Admits to sexual activity.  Denies abdominal or flank pain.  Has NOT tried OTC medications.  Symptoms are made worse with urination.  Admits to similar symptoms in the past.  Denies fever, chills, nausea, vomiting, abdominal pain, flank pain, abnormal vaginal discharge, hematuria.    LMP: Currently on menstrual cycle  ROS: As in HPI.  All other pertinent ROS negative.     Past Medical History:  Diagnosis Date  . Abscess of tonsil    Past Surgical History:  Procedure Laterality Date  . CESAREAN SECTION    . CESAREAN SECTION N/A 01/31/2014   Procedure: Repeat CESAREAN SECTION;  Surgeon: Princess Bruins, MD;  Location: [facility];  Service: Obstetrics;  Laterality: N/A;  EDD: 02/06/14  . CHOLECYSTECTOMY N/A 09/22/2012   Procedure: LAPAROSCOPIC CHOLECYSTECTOMY;  Surgeon: Scherry Ran, MD;  Location: [facility];  Service: General;  Laterality: N/A;  . ERCP N/A 10/06/2012   Procedure: ENDOSCOPIC RETROGRADE CHOLANGIOPANCREATOGRAPHY (ERCP);  Surgeon: Daneil Dolin, MD;  Location: [facility];  Service: Gastroenterology;  Laterality: N/A;  . REMOVAL OF STONES N/A 10/06/2012   Procedure: REMOVAL OF STONES;  Surgeon: Daneil Dolin, MD;  Location: [facility];  Service: Gastroenterology;  Laterality: N/A;  . SPHINCTEROTOMY N/A 10/06/2012   Procedure: SPHINCTEROTOMY;  Surgeon: Daneil Dolin, MD;  Location: [facility];  Service: Gastroenterology;  Laterality: N/A;  . TONSILLECTOMY N/A 06/03/2016   Procedure: TONSILLECTOMY;  Surgeon: Izora Gala, MD;  Location: [facility];  Service: ENT;  Laterality: N/A;   Allergies  Allergen Reactions  . Phentermine Itching  . Bactrim [Sulfamethoxazole-Trimethoprim] Rash  . Penicillins Rash  . Sulfa Antibiotics Rash   No current facility-administered medications on file prior to encounter.    Current Outpatient Medications on File Prior to Encounter  Medication Sig Dispense Refill  . Diethylpropion HCl 25 MG TABS Take by mouth. daily    . Multiple Vitamin (MULTIVITAMIN) capsule Take 1 capsule by mouth daily.     Social History   Socioeconomic History  . Marital status: Single    Spouse name: Not on file  . Number of children: Not on file  . Years of education: Not on file  . Highest education level: Not on file  Occupational History  . Occupation: Programmer, multimedia: [employer]  Tobacco Use  . Smoking status: Former Smoker    Packs/day: 1.00    Years: 11.00    Pack years: 11.00    Types: Cigarettes    Start date: 10/26/2001    Quit date: 08/22/2012    Years since quitting: 7.1  . Smokeless tobacco: Never Used  Substance and Sexual Activity  . Alcohol use: Yes    Alcohol/week: 0.0 standard drinks    Comment: rare  . Drug use: No  . Sexual activity: Yes    Partners: Male    Comment: 1st intercourse- 68, partners- 37, married- 10 yrs  Other Topics Concern  . Not on file  Social History Narrative  . Not on file   Social Determinants of Health   Financial Resource Strain:   . Difficulty of Paying Living Expenses:   Food Insecurity:   . Worried About Charity fundraiser in the Last Year:   . Arboriculturist in the Last Year:   [employer]  Needs:   . Lack of Transportation (Medical):   Marland Kitchen Lack of Transportation (Non-Medical):   Physical Activity:   . Days of Exercise per Week:   . Minutes of Exercise per Session:   Stress:   . Feeling of Stress :   Social Connections:   . Frequency of Communication with Friends and Family:   . Frequency of Social Gatherings with Friends and Family:   . Attends Religious Services:   . Active Member of Clubs or Organizations:   . Attends Archivist Meetings:   Marland Kitchen Marital Status:   Intimate Partner Violence:   . Fear of Current or Ex-Partner:   . Emotionally Abused:   Marland Kitchen Physically Abused:   . Sexually  Abused:    Family History  Problem Relation Age of Onset  . Hypertension Father   . COPD Father     OBJECTIVE:  Vitals:   10/20/19 1343  BP: 131/86  Pulse: 89  Resp: 18  Temp: 97.9 F (36.6 C)  SpO2: 97%   General appearance: Alert in no acute distress HEENT: NCAT.  Oropharynx clear.  Lungs: clear to auscultation bilaterally without adventitious breath sounds Heart: regular rate and rhythm.   Abdomen: soft; non-distended; no tenderness; bowel sounds present; no guarding Back: no CVA tenderness Extremities: no edema; symmetrical with no gross deformities Skin: warm and dry Neurologic: Ambulates from chair to exam table without difficulty Psychological: alert and cooperative; normal mood and affect  Labs Reviewed  POCT URINALYSIS DIP (MANUAL ENTRY) - Abnormal; Notable for the following components:      Result Value   Ketones, POC UA trace (5) (*)    Spec Grav, UA >=1.030 (*)    Blood, UA moderate (*)    Protein Ur, POC trace (*)    Leukocytes, UA Small (1+) (*)    All other components within normal limits  URINE CULTURE  POCT URINE PREGNANCY    ASSESSMENT & PLAN:  1. Dysuria     Meds ordered this encounter  Medications  . nitrofurantoin, macrocrystal-monohydrate, (MACROBID) 100 MG capsule    Sig: Take 1 capsule (100 mg total) by mouth 2 (two) times daily.    Dispense:  10 capsule    Refill:  0    Order Specific Question:   Supervising Provider    Answer:   Raylene Everts [id number]   Urine without obvious signs of infection Urine culture sent.  We will call you with abnormal results.   Paper prescription attached.  If urine culture comes back positive for infection you may begin taking medication Push fluids and get plenty of rest.   Follow up with PCP if symptoms persists Return here or go to ER if you have any new or worsening symptoms such as fever, worsening abdominal pain, nausea/vomiting, flank pain, etc...  Outlined signs and symptoms indicating  need for more acute intervention. Patient verbalized understanding. After Visit Summary given.     Lestine Box, PA-C 10/20/19 1405

## 2019-10-22 DIAGNOSIS — N390 Urinary tract infection, site not specified: Secondary | ICD-10-CM

## 2019-10-22 LAB — URINE CULTURE: Culture: 30000 — AB

## 2019-10-23 NOTE — Telephone Encounter (Signed)
Pls send referral back to urology.  Needs to be evaluated.  Thx, Dr. Lovena Le

## 2019-10-23 NOTE — Telephone Encounter (Signed)
Referral ordered in EPIC. 

## 2019-10-29 ENCOUNTER — Encounter: Payer: No Typology Code available for payment source | Admitting: Obstetrics & Gynecology

## 2019-10-31 ENCOUNTER — Encounter: Payer: Self-pay | Admitting: Family Medicine

## 2019-10-31 ENCOUNTER — Other Ambulatory Visit (HOSPITAL_COMMUNITY): Payer: Self-pay | Admitting: Urology

## 2019-11-30 MED FILL — NITROFURANTOIN MCR 50 MG CA: 50 | 30 days supply | Qty: 30 | Fill #1

## 2019-12-03 ENCOUNTER — Encounter: Payer: No Typology Code available for payment source | Admitting: Obstetrics & Gynecology

## 2019-12-24 ENCOUNTER — Encounter: Payer: No Typology Code available for payment source | Admitting: Obstetrics & Gynecology

## 2019-12-26 ENCOUNTER — Ambulatory Visit: Payer: No Typology Code available for payment source | Admitting: Urology

## 2020-01-02 MED FILL — NITROFURANTOIN MCR 50 MG CA: 50 | 30 days supply | Qty: 30 | Fill #2

## 2020-01-04 ENCOUNTER — Other Ambulatory Visit: Payer: Self-pay

## 2020-01-04 ENCOUNTER — Ambulatory Visit (INDEPENDENT_AMBULATORY_CARE_PROVIDER_SITE_OTHER): Payer: No Typology Code available for payment source | Admitting: Obstetrics & Gynecology

## 2020-01-04 ENCOUNTER — Encounter: Payer: Self-pay | Admitting: Obstetrics & Gynecology

## 2020-01-04 VITALS — BP 110/72 | Ht 66.0 in | Wt 196.0 lb

## 2020-01-04 DIAGNOSIS — Z9189 Other specified personal risk factors, not elsewhere classified: Secondary | ICD-10-CM | POA: Diagnosis not present

## 2020-01-04 DIAGNOSIS — E669 Obesity, unspecified: Secondary | ICD-10-CM | POA: Diagnosis not present

## 2020-01-04 DIAGNOSIS — Z01419 Encounter for gynecological examination (general) (routine) without abnormal findings: Secondary | ICD-10-CM | POA: Diagnosis not present

## 2020-01-04 DIAGNOSIS — N3090 Cystitis, unspecified without hematuria: Secondary | ICD-10-CM | POA: Diagnosis not present

## 2020-01-04 NOTE — Progress Notes (Signed)
Tanya Sanders [birth year] [id number]   History:    34 y.o. G2P2L2 Married.  Vasectomy.  Daughter 71 yo, son 29 yo.  RP:  Established patient presenting for annual gyn exam   HPI: Menstrual periods every month with normal heavy side flow.  No breakthrough bleeding.  No pelvic pain.  No pain with intercourse.  Normal vaginal secretions.  Urine and bowel movements normal.  Breast normal.  Body mass index 31.64.  Good muscle mass.  Patient is fit with aerobic activities regularly and weightlifting 2-3 times a week.  Seen by family physician last year.   Past medical history,surgical history, family history and social history were all reviewed and documented in the EPIC chart.  Gynecologic History Patient's last menstrual period was 12/17/2019.  Obstetric History OB History  Gravida Para Term Preterm AB Living  2 2 1     2   SAB TAB Ectopic Multiple Live Births          1    # Outcome Date GA Lbr Len/2nd Weight Sex Delivery Anes PTL Lv  2 Para 01/31/14   7 lb 15 oz (3.6 kg) M CS-LTranv Spinal  LIV  1 Term              ROS: A ROS was performed and pertinent positives and negatives are included in the history.  GENERAL: No fevers or chills. HEENT: No change in vision, no earache, sore throat or sinus congestion. NECK: No pain or stiffness. CARDIOVASCULAR: No chest pain or pressure. No palpitations. PULMONARY: No shortness of breath, cough or wheeze. GASTROINTESTINAL: No abdominal pain, nausea, vomiting or diarrhea, melena or bright red blood per rectum. GENITOURINARY: No urinary frequency, urgency, hesitancy or dysuria. MUSCULOSKELETAL: No joint or muscle pain, no back pain, no recent trauma. DERMATOLOGIC: No rash, no itching, no lesions. ENDOCRINE: No polyuria, polydipsia, no heat or cold intolerance. No recent change in weight. HEMATOLOGICAL: No anemia or easy bruising or bleeding. NEUROLOGIC: No headache, seizures, numbness, tingling or weakness. PSYCHIATRIC: No depression, no loss of  interest in normal activity or change in sleep pattern.     Exam:   BP 110/72   Ht 5\' 6"  (1.676 m)   Wt 196 lb (88.9 kg)   LMP 12/17/2019 Comment: patient's husband with vasectomy   BMI 31.64 kg/m   Body mass index is 31.64 kg/m.  General appearance : Well developed well nourished female. No acute distress HEENT: Eyes: no retinal hemorrhage or exudates,  Neck supple, trachea midline, no carotid bruits, no thyroidmegaly Lungs: Clear to auscultation, no rhonchi or wheezes, or rib retractions  Heart: Regular rate and rhythm, no murmurs or gallops Breast:Examined in sitting and supine position were symmetrical in appearance, no palpable masses or tenderness,  no skin retraction, no nipple inversion, no nipple discharge, no skin discoloration, no axillary or supraclavicular lymphadenopathy Abdomen: no palpable masses or tenderness, no rebound or guarding Extremities: no edema or skin discoloration or tenderness  Pelvic: Vulva: Normal             Vagina: No gross lesions or discharge  Cervix: No gross lesions or discharge  Uterus  AV, normal size, shape and consistency, non-tender and mobile  Adnexa  Without masses or tenderness  Anus: Normal   Assessment/Plan:  34 y.o. female for annual exam   1. Well female exam with routine gynecological exam Normal gynecologic exam.  Will repeat a pap test next year.  Breast exam normal.  Followed by Heriberto Antigua MD.  2. Relies on partner vasectomy for contraception  3. Recurrent cystitis On prophylactic antibiotherapy.  Urologic investigation negative.  4. Obesity (BMI 30.0-34.9) Continue with fitness.  Recommend decreasing calories/carbs in diet.  Princess Bruins MD, 11:13 AM 01/04/2020

## 2020-02-29 MED FILL — NITROFURANTOIN MCR 50 MG CA: 50 | 30 days supply | Qty: 30 | Fill #3

## 2020-03-31 ENCOUNTER — Other Ambulatory Visit (HOSPITAL_COMMUNITY): Payer: Self-pay | Admitting: Urology

## 2020-03-31 MED FILL — NITROFURANTOIN MCR 50 MG CA: 50 | 90 days supply | Qty: 90 | Fill #0

## 2020-06-13 ENCOUNTER — Telehealth (INDEPENDENT_AMBULATORY_CARE_PROVIDER_SITE_OTHER): Payer: No Typology Code available for payment source | Admitting: Family Medicine

## 2020-06-13 DIAGNOSIS — R635 Abnormal weight gain: Secondary | ICD-10-CM

## 2020-06-13 DIAGNOSIS — R4589 Other symptoms and signs involving emotional state: Secondary | ICD-10-CM

## 2020-06-13 NOTE — Progress Notes (Signed)
Patient ID: Tanya Sanders, adult    DOB: Nov 09, 1985, 35 y.o.   MRN: [id number]   Virtual Visit via Telephone Note  I connected with Tanya Sanders on 06/13/20 at 10:40 AM EST by telephone and verified that I am speaking with the correct person using two identifiers.  Location: Patient: home Provider: office   I discussed the limitations, risks, security and privacy concerns of performing an evaluation and management service by telephone and the availability of in person appointments. I also discussed with the patient that there may be a patient responsible charge related to this service. The patient expressed understanding and agreed to proceed.   Chief Complaint  Patient presents with  . hormone questions    Moodiness and weight gain.   Subjective:    HPI  Pt wanting to see [facility] for concerns of weight gain and mood swings.  needs referral to [facility] to have hormone levels checked. Has appt with them on Monday and needs referral for insurance.   Using [employer] for insurance and needing referral.   Having mood swings more than usual.  More hungry than usual.  Having regular periods.  Not on bcp. Sexual drive is low.  Not having problem with energy.   Not having any issues with depression or anxiety.  But having mood swings and emotional at times. More than normal. And gained a lot of weight over 2021.  Feeling gained 20 lbs.  In past was taking diethylpropion and helped a little.  But insurance wouldn't approve it for maintenance.  Lost about 12lbs with it. Pt decided to discontinue since she would need to pay $100 per month to take it.  Medical History Tanya Sanders has a past medical history of Abscess of tonsil.   Outpatient Encounter Medications as of 06/13/2020  Medication Sig  . Diethylpropion HCl 25 MG TABS Take by mouth. daily  . [DISCONTINUED] nitrofurantoin, macrocrystal-monohydrate, (MACROBID) 100 MG capsule Take 1 capsule (100  mg total) by mouth 2 (two) times daily.   No facility-administered encounter medications on file as of 06/13/2020.     Review of Systems  Constitutional: Positive for unexpected weight change (weight gain). Negative for chills and fever.  HENT: Negative for congestion, rhinorrhea and sore throat.   Respiratory: Negative for cough, shortness of breath and wheezing.   Cardiovascular: Negative for chest pain and leg swelling.  Gastrointestinal: Negative for abdominal pain, diarrhea, nausea and vomiting.  Genitourinary: Negative for dysuria and frequency.  Musculoskeletal: Negative for arthralgias and back pain.  Skin: Negative for rash.  Neurological: Negative for dizziness, weakness and headaches.  Psychiatric/Behavioral: Negative for behavioral problems, dysphoric mood, self-injury, sleep disturbance and suicidal ideas.       +moodiness and emotional more frequently.     Vitals There were no vitals taken for this visit.  Objective:   Physical Exam No PE due to phone visit.  Assessment and Plan   1. Moodiness  2. Weight gain   Pt given referral verbally.  Pt stating not needing paperwork for this.  F/u prn.  Follow Up Instructions:    I discussed the assessment and treatment plan with the patient. The patient was provided an opportunity to ask questions and all were answered. The patient agreed with the plan and demonstrated an understanding of the instructions.   The patient was advised to call back or seek an in-person evaluation if the symptoms worsen or if the condition fails to improve as anticipated.  I provided 15 minutes of non-face-to-face time during this encounter.

## 2020-06-23 MED FILL — NITROFURANTOIN MCR 50 MG CA: 50 | 90 days supply | Qty: 90 | Fill #1

## 2020-06-25 ENCOUNTER — Encounter (INDEPENDENT_AMBULATORY_CARE_PROVIDER_SITE_OTHER): Payer: Self-pay

## 2020-07-02 ENCOUNTER — Ambulatory Visit (INDEPENDENT_AMBULATORY_CARE_PROVIDER_SITE_OTHER): Payer: No Typology Code available for payment source | Admitting: Bariatrics

## 2020-07-16 ENCOUNTER — Ambulatory Visit (INDEPENDENT_AMBULATORY_CARE_PROVIDER_SITE_OTHER): Payer: No Typology Code available for payment source | Admitting: Bariatrics

## 2020-08-12 ENCOUNTER — Other Ambulatory Visit (HOSPITAL_BASED_OUTPATIENT_CLINIC_OR_DEPARTMENT_OTHER): Payer: Self-pay

## 2020-09-19 HISTORY — PX: EYE SURGERY: SHX253

## 2020-09-26 ENCOUNTER — Ambulatory Visit (INDEPENDENT_AMBULATORY_CARE_PROVIDER_SITE_OTHER): Payer: No Typology Code available for payment source | Admitting: Family Medicine

## 2020-09-26 ENCOUNTER — Encounter: Payer: Self-pay | Admitting: Family Medicine

## 2020-09-26 ENCOUNTER — Other Ambulatory Visit: Payer: Self-pay

## 2020-09-26 VITALS — HR 111 | Temp 99.1°F | Ht 66.0 in | Wt 213.0 lb

## 2020-09-26 DIAGNOSIS — R059 Cough, unspecified: Secondary | ICD-10-CM | POA: Insufficient documentation

## 2020-09-26 DIAGNOSIS — J40 Bronchitis, not specified as acute or chronic: Secondary | ICD-10-CM | POA: Diagnosis not present

## 2020-09-26 MED ORDER — BENZONATATE 100 MG PO CAPS: 100.0000 mg | ORAL_CAPSULE | Freq: Two times a day (BID) | ORAL | 0 refills | Status: DC | PRN

## 2020-09-26 MED ORDER — GUAIFENESIN-CODEINE 100-10 MG/5ML PO SOLN: ORAL | 0 refills | Status: DC

## 2020-09-26 MED ORDER — PREDNISONE 20 MG PO TABS: ORAL_TABLET | ORAL | 0 refills | Status: DC

## 2020-09-26 NOTE — Patient Instructions (Addendum)
Cough, Adult A cough helps to clear your throat and lungs. A cough may be a sign of an illness or another medical condition. An acute cough may only last 2-3 weeks, while a chronic cough may last 8 or more weeks. Many things can cause a cough. They include:  Germs (viruses or bacteria) that attack the airway.  Breathing in things that bother (irritate) your lungs.  Allergies.  Asthma.  Mucus that runs down the back of your throat (postnasal drip).  Smoking.  Acid backing up from the stomach into the tube that moves food from the mouth to the stomach (gastroesophageal reflux).  Some medicines.  Lung problems.  Other medical conditions, such as heart failure or a blood clot in the lung (pulmonary embolism). Follow these instructions at home: Medicines  Take over-the-counter and prescription medicines only as told by your doctor.  Talk with your doctor before you take medicines that stop a cough (cough suppressants). Lifestyle  Do not smoke, and try not to be around smoke. Do not use any products that contain nicotine or tobacco, such as cigarettes, e-cigarettes, and chewing tobacco. If you need help quitting, ask your doctor.  Drink enough fluid to keep your pee (urine) pale yellow.  Avoid caffeine.  Do not drink alcohol if your doctor tells you not to drink.   General instructions  Watch for any changes in your cough. Tell your doctor about them.  Always cover your mouth when you cough.  Stay away from things that make you cough, such as perfume, candles, campfire smoke, or cleaning products.  If the air is dry, use a cool mist vaporizer or humidifier in your home.  If your cough is worse at night, try using extra pillows to raise your head up higher while you sleep.  Rest as needed.  Keep all follow-up visits as told by your doctor. This is important.   Contact a doctor if:  You have new symptoms.  You cough up pus.  Your cough does not get better after 2-3  weeks, or your cough gets worse.  Cough medicine does not help your cough and you are not sleeping well.  You have pain that gets worse or pain that is not helped with medicine.  You have a fever.  You are losing weight and you do not know why.  You have night sweats. Get help right away if:  You cough up blood.  You have trouble breathing.  Your heartbeat is very fast. These symptoms may be an emergency. Do not wait to see if the symptoms will go away. Get medical help right away. Call your local emergency services (911 in the [place] Do not drive yourself to the hospital. Summary  A cough helps to clear your throat and lungs. Many things can cause a cough.  Take over-the-counter and prescription medicines only as told by your doctor.  Always cover your mouth when you cough.  Contact a doctor if you have new symptoms or you have a cough that does not get better or gets worse. This information is not intended to replace advice given to you by your health care provider. Make sure you discuss any questions you have with your health care provider. Document Revised: 06/29/2019 Document Reviewed: 05/29/2018 Elsevier Patient Education  2021 [employer] Acute Bronchitis, Adult  Acute bronchitis is when air tubes in the lungs (bronchi) suddenly get swollen. The condition can make it hard for you to breathe. In adults, acute bronchitis usually goes away   within 2 weeks. A cough caused by bronchitis may last up to 3 weeks. Smoking, allergies, and asthma can make the condition worse. What are the causes? This condition is caused by:  Cold and flu viruses. The most common cause of this condition is the virus that causes the common cold.  Bacteria.  Substances that irritate the lungs, including: ? Smoke from cigarettes and other types of tobacco. ? Dust and pollen. ? Fumes from chemicals, gases, or burned fuel. ? Other materials that pollute indoor or outdoor air.  Close contact  with someone who has acute bronchitis. What increases the risk? The following factors may make you more likely to develop this condition:  A weak body's defense system. This is also called the immune system.  Any condition that affects your lungs and breathing, such as asthma. What are the signs or symptoms? Symptoms of this condition include:  A cough.  Coughing up clear, yellow, or green mucus.  Wheezing.  Chest congestion.  Shortness of breath.  A fever.  Body aches.  Chills.  A sore throat. How is this treated? Acute bronchitis may go away over time without treatment. Your doctor may recommend:  Drinking more fluids.  Taking a medicine for a fever or cough.  Using a device that gets medicine into your lungs (inhaler).  Using a vaporizer or a humidifier. These are machines that add water or moisture in the air to help with coughing and poor breathing. Follow these instructions at home: Activity  Get a lot of rest.  Avoid places where there are fumes from chemicals.  Return to your normal activities as told by your doctor. Ask your doctor what activities are safe for you. Lifestyle  Drink enough fluids to keep your pee (urine) pale yellow.  Do not drink alcohol.  Do not use any products that contain nicotine or tobacco, such as cigarettes, e-cigarettes, and chewing tobacco. If you need help quitting, ask your doctor. Be aware that: ? Your bronchitis will get worse if you smoke or breathe in other people's smoke (secondhand smoke). ? Your lungs will heal faster if you quit smoking. General instructions  Take over-the-counter and prescription medicines only as told by your doctor.  Use an inhaler, cool mist vaporizer, or humidifier as told by your doctor.  Rinse your mouth often with salt water. To make salt water, dissolve -1 tsp (3-6 g) of salt in 1 cup (237 mL) of warm water.  Keep all follow-up visits as told by your doctor. This is important.   How  is this prevented? To lower your risk of getting this condition again:  Wash your hands often with soap and water. If soap and water are not available, use hand sanitizer.  Avoid contact with people who have cold symptoms.  Try not to touch your mouth, nose, or eyes with your hands.  Make sure to get the flu shot every year.   Contact a doctor if:  Your symptoms do not get better in 2 weeks.  You vomit more than once or twice.  You have symptoms of loss of fluid from your body (dehydration). These include: ? Dark urine. ? Dry skin or eyes. ? Increased thirst. ? Headaches. ? Confusion. ? Muscle cramps. Get help right away if:  You cough up blood.  You have chest pain.  You have very bad shortness of breath.  You become dehydrated.  You faint or keep feeling like you are going to faint.  You keep vomiting.    You have a very bad headache.  Your fever or chills get worse. These symptoms may be an emergency. Do not wait to see if the symptoms will go away. Get medical help right away. Call your local emergency services (911 in the [place] Do not drive yourself to the hospital. Summary  Acute bronchitis is when air tubes in the lungs (bronchi) suddenly get swollen. In adults, acute bronchitis usually goes away within 2 weeks.  Take over-the-counter and prescription medicines only as told by your doctor.  Drink enough fluid to keep your pee (urine) pale yellow.  Contact a doctor if your symptoms do not improve after 2 weeks of treatment.  Get help right away if you cough up blood, faint, or have chest pain or shortness of breath. This information is not intended to replace advice given to you by your health care provider. Make sure you discuss any questions you have with your health care provider. Document Revised: 12/01/2018 Document Reviewed: 12/01/2018 Elsevier Patient Education  2021 Elsevier Inc.  

## 2020-09-26 NOTE — Progress Notes (Signed)
Patient ID: Tanya Sanders, adult    DOB: [birth year], 35 y.o.   MRN: [id number]   Chief Complaint  Patient presents with  . Cough    States developed a cold last week and has been taking cold relied meds - has lingering cough taking Robitussin, mucinex - non- productive   Subjective:  CC: lingering non-productive cough  This is a new problem.  Presents today for follow-up for lingering nonproductive cough.  Reports that she got a cold last week, symptoms mostly resolved, was having a lingering cough keeping her awake at night.  She has tried Robitussin, Mucinex OTC.  Denies fever, chills, chest pain, shortness of breath.  Endorses lingering cough.    Medical History Suzi has a past medical history of Abscess of tonsil.   Outpatient Encounter Medications as of 09/26/2020  Medication Sig  . benzonatate (TESSALON) 100 MG capsule Take 1 capsule (100 mg total) by mouth 2 (two) times daily as needed for cough.  Marland Kitchen guaiFENesin-codeine 100-10 MG/5ML syrup Take 5 mls by mouth at bedtime for severe cough.  . predniSONE (DELTASONE) 20 MG tablet Take 3 tablets by mouth for 3 days, then 2 tablets by mouth for 3 days, then 1 tablet by mouth for 3 days.  . nitrofurantoin (MACRODANTIN) 50 MG capsule TAKE 1 CAPSULE BY MOUTH ONCE A DAY (Patient not taking: Reported on 09/26/2020)  . [DISCONTINUED] Diethylpropion HCl 25 MG TABS Take by mouth. daily  . [DISCONTINUED] nitrofurantoin (MACRODANTIN) 50 MG capsule TAKE 1 CAPSULE BY MOUTH DAILY (Patient not taking: Reported on 09/26/2020)   No facility-administered encounter medications on file as of 09/26/2020.     Review of Systems  Constitutional: Negative for chills and fever.  HENT: Negative for congestion and sore throat.   Respiratory: Positive for cough. Negative for shortness of breath and wheezing.   Cardiovascular: Negative for chest pain.  Gastrointestinal: Negative for abdominal pain.     Vitals Pulse (!) 111   Temp 99.1 F (37.3 C)    Ht 5\' 6"  (1.676 m)   Wt 213 lb (96.6 kg)   SpO2 98%   BMI 34.38 kg/m   Objective:   Physical Exam Vitals reviewed.  Constitutional:      Appearance: Normal appearance.  Cardiovascular:     Rate and Rhythm: Normal rate and regular rhythm.     Heart sounds: Normal heart sounds.  Pulmonary:     Effort: Pulmonary effort is normal.     Breath sounds: Normal breath sounds.  Skin:    General: Skin is warm and dry.  Neurological:     General: No focal deficit present.     Mental Status: She is alert.  Psychiatric:        Behavior: Behavior normal.      Assessment and Plan   1. Bronchitis - predniSONE (DELTASONE) 20 MG tablet; Take 3 tablets by mouth for 3 days, then 2 tablets by mouth for 3 days, then 1 tablet by mouth for 3 days.  Dispense: 18 tablet; Refill: 0  2. Cough in adult - benzonatate (TESSALON) 100 MG capsule; Take 1 capsule (100 mg total) by mouth 2 (two) times daily as needed for cough.  Dispense: 20 capsule; Refill: 0 - guaiFENesin-codeine 100-10 MG/5ML syrup; Take 5 mls by mouth at bedtime for severe cough.  Dispense: 60 mL; Refill: 0   Lungs clear, lingering cough likely due to acute bronchitis.  Will treat with prednisone taper, Tessalon Perles for daytime relief, guaifenesin with codeine for  severe cough at night.  Recommend supportive therapy, adequate hydration.  Agrees with plan of care discussed today. Understands warning signs to seek further care: chest pain, shortness of breath, any significant change in health.  Understands to follow-up if symptoms do not improve, or worsen.    Chalmers Guest, NP 09/26/2020

## 2020-09-29 ENCOUNTER — Ambulatory Visit: Payer: No Typology Code available for payment source | Admitting: Family Medicine

## 2020-11-18 ENCOUNTER — Other Ambulatory Visit (HOSPITAL_COMMUNITY): Payer: Self-pay

## 2020-11-18 MED ORDER — CLINDAMYCIN HCL 150 MG PO CAPS
ORAL_CAPSULE | ORAL | 0 refills | Status: DC
  Filled 2020-11-18: qty 28, 7d supply, fill #0

## 2021-01-05 ENCOUNTER — Encounter: Payer: Self-pay | Admitting: Obstetrics & Gynecology

## 2021-01-05 ENCOUNTER — Other Ambulatory Visit: Payer: Self-pay

## 2021-01-05 ENCOUNTER — Other Ambulatory Visit (HOSPITAL_COMMUNITY)
Admission: RE | Admit: 2021-01-05 | Discharge: 2021-01-05 | Disposition: A | Discharge status: Home / Self Care | Payer: No Typology Code available for payment source | Source: Ambulatory Visit | Attending: Obstetrics & Gynecology | Admitting: Obstetrics & Gynecology

## 2021-01-05 ENCOUNTER — Ambulatory Visit (INDEPENDENT_AMBULATORY_CARE_PROVIDER_SITE_OTHER): Payer: No Typology Code available for payment source | Admitting: Obstetrics & Gynecology

## 2021-01-05 VITALS — BP 124/82 | HR 82 | Resp 20 | Ht 65.0 in | Wt 216.6 lb

## 2021-01-05 DIAGNOSIS — Z01419 Encounter for gynecological examination (general) (routine) without abnormal findings: Secondary | ICD-10-CM | POA: Diagnosis present

## 2021-01-05 DIAGNOSIS — Z6836 Body mass index (BMI) 36.0-36.9, adult: Secondary | ICD-10-CM | POA: Diagnosis not present

## 2021-01-05 DIAGNOSIS — E6609 Other obesity due to excess calories: Secondary | ICD-10-CM | POA: Diagnosis not present

## 2021-01-05 DIAGNOSIS — Z9189 Other specified personal risk factors, not elsewhere classified: Secondary | ICD-10-CM | POA: Diagnosis not present

## 2021-01-05 NOTE — Progress Notes (Deleted)
    Tanya Sanders 02-Apr-1986 [id number]   History:    35 y.o. G2P2L2 Married.  Vasectomy.  Daughter 39 yo, son 56+ yo.   RP:  Established patient presenting for annual gyn exam    HPI: Menstrual periods every month with normal heavy side flow.  No breakthrough bleeding.  No pelvic pain.  No pain with intercourse.  Normal vaginal secretions.  Urine and bowel movements normal.  Breast normal.  Body mass index 36.04.  Good muscle mass.  Patient is fit with aerobic activities regularly and weightlifting 2-3 times a week.  Seen by family physician last year.    Past medical history,surgical history, family history and social history were all reviewed and documented in the EPIC chart.  Gynecologic History Patient's last menstrual period was 01/02/2021 (exact date).  Obstetric History OB History  Gravida Para Term Preterm AB Living  '2 2 1     2  '$ SAB IAB Ectopic Multiple Live Births          1    # Outcome Date GA Lbr Len/2nd Weight Sex Delivery Anes PTL Lv  2 Para 01/31/14   7 lb 15 oz (3.6 kg) M CS-LTranv Spinal  LIV  1 Term              ROS: A ROS was performed and pertinent positives and negatives are included in the history.  GENERAL: No fevers or chills. HEENT: No change in vision, no earache, sore throat or sinus congestion. NECK: No pain or stiffness. CARDIOVASCULAR: No chest pain or pressure. No palpitations. PULMONARY: No shortness of breath, cough or wheeze. GASTROINTESTINAL: No abdominal pain, nausea, vomiting or diarrhea, melena or bright red blood per rectum. GENITOURINARY: No urinary frequency, urgency, hesitancy or dysuria. MUSCULOSKELETAL: No joint or muscle pain, no back pain, no recent trauma. DERMATOLOGIC: No rash, no itching, no lesions. ENDOCRINE: No polyuria, polydipsia, no heat or cold intolerance. No recent change in weight. HEMATOLOGICAL: No anemia or easy bruising or bleeding. NEUROLOGIC: No headache, seizures, numbness, tingling or weakness. PSYCHIATRIC: No  depression, no loss of interest in normal activity or change in sleep pattern.     Exam:   BP 124/82 (BP Location: Right Arm)   Pulse 82   Resp 20   Ht '5\' 5"'$  (1.651 m)   Wt 216 lb 9.6 oz (98.2 kg)   LMP 01/02/2021 (Exact Date)   BMI 36.04 kg/m   Body mass index is 36.04 kg/m.  General appearance : Well developed well nourished female. No acute distress HEENT: Eyes: no retinal hemorrhage or exudates,  Neck supple, trachea midline, no carotid bruits, no thyroidmegaly Lungs: Clear to auscultation, no rhonchi or wheezes, or rib retractions  Heart: Regular rate and rhythm, no murmurs or gallops Breast:Examined in sitting and supine position were symmetrical in appearance, no palpable masses or tenderness,  no skin retraction, no nipple inversion, no nipple discharge, no skin discoloration, no axillary or supraclavicular lymphadenopathy Abdomen: no palpable masses or tenderness, no rebound or guarding Extremities: no edema or skin discoloration or tenderness  Pelvic: Vulva: Normal             Vagina: No gross lesions or discharge  Cervix: No gross lesions or discharge  Uterus  ***, normal size, shape and consistency, non-tender and mobile  Adnexa  Without masses or tenderness  Anus: ***   Assessment/Plan:  35 y.o. female for annual exam   ***  Princess Bruins MD, 2:19 PM 01/05/2021

## 2021-01-05 NOTE — Progress Notes (Signed)
Tanya Sanders Mar 22, 1986 [id number]   History:    35 y.o. G2P2L2 Married.  Vasectomy.  Daughter 23 yo, son 57+ yo.   RP:  Established patient presenting for annual gyn exam    HPI: Menstrual periods every month with normal heavy side flow.  No breakthrough bleeding.  No pelvic pain.  No pain with intercourse.  Normal vaginal secretions.  Urine and bowel movements normal.  Breast normal.  Body mass index increased to 36.04.  Healthy nutrition, but needs to decrease portions.  Good muscle mass.  Patient is fit with aerobic activities regularly and weightlifting 2-3 times a week.      Past medical history,surgical history, family history and social history were all reviewed and documented in the EPIC chart.  Gynecologic History Patient's last menstrual period was 01/02/2021 (exact date).  Obstetric History OB History  Gravida Para Term Preterm AB Living  '2 2 1     2  '$ SAB IAB Ectopic Multiple Live Births          1    # Outcome Date GA Lbr Len/2nd Weight Sex Delivery Anes PTL Lv  2 Para 01/31/14   7 lb 15 oz (3.6 kg) M CS-LTranv Spinal  LIV  1 Term              ROS: A ROS was performed and pertinent positives and negatives are included in the history.  GENERAL: No fevers or chills. HEENT: No change in vision, no earache, sore throat or sinus congestion. NECK: No pain or stiffness. CARDIOVASCULAR: No chest pain or pressure. No palpitations. PULMONARY: No shortness of breath, cough or wheeze. GASTROINTESTINAL: No abdominal pain, nausea, vomiting or diarrhea, melena or bright red blood per rectum. GENITOURINARY: No urinary frequency, urgency, hesitancy or dysuria. MUSCULOSKELETAL: No joint or muscle pain, no back pain, no recent trauma. DERMATOLOGIC: No rash, no itching, no lesions. ENDOCRINE: No polyuria, polydipsia, no heat or cold intolerance. No recent change in weight. HEMATOLOGICAL: No anemia or easy bruising or bleeding. NEUROLOGIC: No headache, seizures, numbness, tingling or  weakness. PSYCHIATRIC: No depression, no loss of interest in normal activity or change in sleep pattern.     Exam:   BP 124/82 (BP Location: Right Arm)   Pulse 82   Resp 20   Ht '5\' 5"'$  (1.651 m)   Wt 216 lb 9.6 oz (98.2 kg)   LMP 01/02/2021 (Exact Date)   BMI 36.04 kg/m   Body mass index is 36.04 kg/m.  General appearance : Well developed well nourished female. No acute distress HEENT: Eyes: no retinal hemorrhage or exudates,  Neck supple, trachea midline, no carotid bruits, no thyroidmegaly Lungs: Clear to auscultation, no rhonchi or wheezes, or rib retractions  Heart: Regular rate and rhythm, no murmurs or gallops Breast:Examined in sitting and supine position were symmetrical in appearance, no palpable masses or tenderness,  no skin retraction, no nipple inversion, no nipple discharge, no skin discoloration, no axillary or supraclavicular lymphadenopathy Abdomen: no palpable masses or tenderness, no rebound or guarding Extremities: no edema or skin discoloration or tenderness  Pelvic: Vulva: Normal             Vagina: No gross lesions or discharge  Cervix: No gross lesions or discharge.  Pap reflex done.  Uterus  AV, normal size, shape and consistency, non-tender and mobile  Adnexa  Without masses or tenderness  Anus: Normal   Assessment/Plan:  35 y.o. female for annual exam   1. Encounter for routine gynecological  examination with Papanicolaou smear of cervix Normal gynecologic exam.  Pap reflex done.  Breast exam normal. - Cytology - PAP( [place])  2. Relies on partner vasectomy for contraception  3. Class 2 obesity due to excess calories without serious comorbidity with body mass index (BMI) of 36.0 to 36.9 in adult  Recommend a lower calorie/carb diet with smaller portions.  Continue with fitness.  Princess Bruins MD, 2:36 PM 01/05/2021

## 2021-01-06 LAB — CYTOLOGY - PAP: Diagnosis: NEGATIVE

## 2021-06-03 ENCOUNTER — Other Ambulatory Visit: Payer: Self-pay

## 2021-06-03 ENCOUNTER — Encounter: Payer: Self-pay | Admitting: Emergency Medicine

## 2021-06-03 ENCOUNTER — Other Ambulatory Visit (HOSPITAL_COMMUNITY): Payer: Self-pay

## 2021-06-03 ENCOUNTER — Ambulatory Visit
Admission: EM | Admit: 2021-06-03 | Discharge: 2021-06-03 | Disposition: A | Discharge status: Home / Self Care | Payer: No Typology Code available for payment source | Attending: Urgent Care | Admitting: Urgent Care

## 2021-06-03 DIAGNOSIS — N390 Urinary tract infection, site not specified: Secondary | ICD-10-CM | POA: Diagnosis not present

## 2021-06-03 DIAGNOSIS — N3001 Acute cystitis with hematuria: Secondary | ICD-10-CM | POA: Insufficient documentation

## 2021-06-03 LAB — POCT URINALYSIS DIP (MANUAL ENTRY)
Glucose, UA: 100 mg/dL — AB
Nitrite, UA: POSITIVE — AB
Protein Ur, POC: 100 mg/dL — AB
Spec Grav, UA: <=1.005 — AB (ref 1.010–1.025)
Urobilinogen, UA: 2 E.U./dL — AB
pH, UA: 5 (ref 5.0–8.0)

## 2021-06-03 MED ORDER — CIPROFLOXACIN HCL 500 MG PO TABS
500.0000 mg | ORAL_TABLET | Freq: Two times a day (BID) | ORAL | 0 refills | Status: DC
  Filled 2021-06-03: qty 10, 5d supply, fill #0

## 2021-06-03 MED ORDER — PHENAZOPYRIDINE HCL 200 MG PO TABS
200.0000 mg | ORAL_TABLET | Freq: Three times a day (TID) | ORAL | 0 refills | Status: DC | PRN
  Filled 2021-06-03: qty 15, 5d supply, fill #0

## 2021-06-03 NOTE — Discharge Instructions (Signed)

## 2021-06-03 NOTE — ED Provider Notes (Signed)
[name]   MRN: [id number] DOB: September 15, 1985  Subjective:   Tanya Sanders is a 36 y.o. adult presenting for 5-day history of acute onset recurrent dysuria, urinary frequency, lower abdominal pain.  Patient has a history of frequent UTIs.  Has previously had to be managed by [facility] but has not gone in about 2 years.  Last episode of a UTI was in 2021.  She did have some resistance as shown on the urine culture.  No flank pain, hematuria, vaginal discharge.  Patient has no concern for pregnancy or STI.  She is taking Azo but would like a prescription for her dysuria.  No current facility-administered medications for this encounter. No current outpatient medications on file.   Allergies  Allergen Reactions   Phentermine Itching   Bactrim [Sulfamethoxazole-Trimethoprim] Rash   Penicillins Rash   Sulfa Antibiotics Rash    Past Medical History:  Diagnosis Date   Abscess of tonsil      Past Surgical History:  Procedure Laterality Date   CESAREAN SECTION     CESAREAN SECTION N/A 01/31/2014   Procedure: Repeat CESAREAN SECTION;  Surgeon: Princess Bruins, MD;  Location: [facility];  Service: Obstetrics;  Laterality: N/A;  EDD: 02/06/14   CHOLECYSTECTOMY N/A 09/22/2012   Procedure: LAPAROSCOPIC CHOLECYSTECTOMY;  Surgeon: Scherry Ran, MD;  Location: [facility];  Service: General;  Laterality: N/A;   ERCP N/A 10/06/2012   Procedure: ENDOSCOPIC RETROGRADE CHOLANGIOPANCREATOGRAPHY (ERCP);  Surgeon: Daneil Dolin, MD;  Location: [facility];  Service: Gastroenterology;  Laterality: N/A;   EYE SURGERY  09/19/2020   LASIK Bilateral    08/2020   REMOVAL OF STONES N/A 10/06/2012   Procedure: REMOVAL OF STONES;  Surgeon: Daneil Dolin, MD;  Location: [facility];  Service: Gastroenterology;  Laterality: N/A;   SPHINCTEROTOMY N/A 10/06/2012   Procedure: SPHINCTEROTOMY;  Surgeon: Daneil Dolin, MD;  Location: [facility];  Service: Gastroenterology;  Laterality: N/A;    TONSILLECTOMY N/A 06/03/2016   Procedure: TONSILLECTOMY;  Surgeon: Izora Gala, MD;  Location: [facility];  Service: ENT;  Laterality: N/A;    Family History  Problem Relation Age of Onset   Hypertension Father    COPD Father    Lung cancer Father     Social History   Tobacco Use   Smoking status: Former    Packs/day: 1.00    Years: 11.00    Pack years: 11.00    Types: Cigarettes    Start date: 10/26/2001    Quit date: 08/22/2012    Years since quitting: 8.7   Smokeless tobacco: Never  Vaping Use   Vaping Use: Never used  Substance Use Topics   Alcohol use: Yes    Alcohol/week: 0.0 standard drinks    Comment: rare   Drug use: No    ROS   Objective:   Vitals: BP 132/90 (BP Location: Right Arm)    Pulse 97    Temp 98.5 F (36.9 C) (Oral)    Resp 18    Ht 5\' 5"  (1.651 m)    Wt 210 lb (95.3 kg)    LMP 05/23/2021 (Exact Date)    SpO2 98%    BMI 34.95 kg/m   Physical Exam Constitutional:      General: She is not in acute distress.    Appearance: Normal appearance. She is well-developed and normal weight. She is not ill-appearing, toxic-appearing or diaphoretic.  HENT:     Head: Normocephalic and atraumatic.     Right Ear:  External ear normal.     Left Ear: External ear normal.     Nose: Nose normal.     Mouth/Throat:     Pharynx: Oropharynx is clear.  Eyes:     General: No scleral icterus.       Right eye: No discharge.        Left eye: No discharge.     Extraocular Movements: Extraocular movements intact.  Cardiovascular:     Rate and Rhythm: Normal rate.  Pulmonary:     Effort: Pulmonary effort is normal.  Abdominal:     General: Bowel sounds are normal. There is no distension.     Palpations: Abdomen is soft. There is no mass.     Tenderness: There is no abdominal tenderness. There is no right CVA tenderness, left CVA tenderness, guarding or rebound.  Musculoskeletal:     Cervical back: Normal range of motion.  Neurological:     Mental Status: She is alert and  oriented to person, place, and time.  Psychiatric:        Mood and Affect: Mood normal.        Behavior: Behavior normal.        Thought Content: Thought content normal.        Judgment: Judgment normal.    Results for orders placed or performed during the hospital encounter of 06/03/21 (from the past 24 hour(s))  POCT urinalysis dipstick     Status: Abnormal   Collection Time: 06/03/21  8:40 AM  Result Value Ref Range   Color, UA orange (A) yellow   Clarity, UA clear clear   Glucose, UA =100 (A) negative mg/dL   Bilirubin, UA small (A) negative   Ketones, POC UA trace (5) (A) negative mg/dL   Spec Grav, UA <=1.005 (A) 1.010 - 1.025   Blood, UA large (A) negative   pH, UA 5.0 5.0 - 8.0   Protein Ur, POC =100 (A) negative mg/dL   Urobilinogen, UA 2.0 (A) 0.2 or 1.0 E.U./dL   Nitrite, UA Positive (A) Negative   Leukocytes, UA Large (3+) (A) Negative    Assessment and Plan :   PDMP not reviewed this encounter.  1. Acute cystitis with hematuria   2. Frequent UTI    Start ciprofloxacin to cover for acute cystitis, urine culture pending.  Recommended aggressive hydration, limiting urinary irritants. Counseled patient on potential for adverse effects with medications prescribed/recommended today, ER and return-to-clinic precautions discussed, patient verbalized understanding. Jaynee Eagles, PA-C 06/03/21 [phone]

## 2021-06-03 NOTE — ED Triage Notes (Signed)
Pt reports history of chronic uti's. Had standing order of abx. Pt reports 4-6 weeks ago started having symptoms again and reports took abx 5 days past symptom onset and reports similar symptoms started again last night and took otc azo for relief.  Pt reports urgency, lower abd pain, dysuria this morning.

## 2021-06-04 LAB — URINE CULTURE
Culture: NO GROWTH
Special Requests: NORMAL

## 2021-06-17 ENCOUNTER — Other Ambulatory Visit: Payer: Self-pay

## 2021-06-17 ENCOUNTER — Encounter: Payer: Self-pay | Admitting: Family Medicine

## 2021-06-17 ENCOUNTER — Other Ambulatory Visit (HOSPITAL_COMMUNITY): Payer: Self-pay

## 2021-06-17 ENCOUNTER — Ambulatory Visit (INDEPENDENT_AMBULATORY_CARE_PROVIDER_SITE_OTHER): Payer: No Typology Code available for payment source | Admitting: Family Medicine

## 2021-06-17 DIAGNOSIS — E782 Mixed hyperlipidemia: Secondary | ICD-10-CM | POA: Diagnosis not present

## 2021-06-17 DIAGNOSIS — E669 Obesity, unspecified: Secondary | ICD-10-CM | POA: Diagnosis not present

## 2021-06-17 DIAGNOSIS — E785 Hyperlipidemia, unspecified: Secondary | ICD-10-CM | POA: Insufficient documentation

## 2021-06-17 MED ORDER — SEMAGLUTIDE-WEIGHT MANAGEMENT 0.25 MG/0.5ML ~~LOC~~ SOAJ
0.2500 mg | SUBCUTANEOUS | 0 refills | Status: DC
  Filled 2021-06-17: qty 2, 30d supply, fill #0

## 2021-06-17 NOTE — Patient Instructions (Signed)
Cut back on the red meat.  We will see if we get [facility] approved.  Take care  Dr. Lacinda Axon

## 2021-06-17 NOTE — Assessment & Plan Note (Signed)
We discussed dietary changes.  We also discussed newer medications that are on the market.  She is willing to try [facility].  We will send into the pharmacy and see if it gets approved.

## 2021-06-17 NOTE — Progress Notes (Signed)
Subjective:  Patient ID: Tanya Sanders, adult    DOB: [birth year]  Age: 36 y.o. MRN: [id number]  CC: Chief Complaint  Patient presents with   Establish Care    Pt has copy of labs that she had done last month at the bariatric clinic. Cholesterol elevated    HPI:  36 year old female presents to discuss recent laboratory studies.  Patient is seeing bariatric clinic for weight loss in [place].  She recently had labs obtained on 12/29.  She brought her labs in with her today.  She is concerned about her lipid panel results.  Triglycerides elevated at 184, total cholesterol elevated at 229, and LDL elevated at 145.  She would like to discuss this today.  She is on no pharmacotherapy regarding hyperlipidemia.  Additionally, patient is currently on phentermine for weight loss.  We discussed safety issues and side effects associated with phentermine.  We discussed newer agents that are on the market to help with weight loss.   Patient Active Problem List   Diagnosis Date Noted   Obesity (BMI 30.0-34.9) 06/17/2021   Hyperlipidemia 06/17/2021   Polycystic ovarian syndrome 08/12/2014    Social Hx   Social History   Socioeconomic History   Marital status: Single    Spouse name: Not on file   Number of children: Not on file   Years of education: Not on file   Highest education level: Not on file  Occupational History   Occupation: Therapist, sports    Employer: [employer]  Tobacco Use   Smoking status: Former    Packs/day: 1.00    Years: 11.00    Pack years: 11.00    Types: Cigarettes    Start date: 10/26/2001    Quit date: 08/22/2012    Years since quitting: 8.8   Smokeless tobacco: Never  Vaping Use   Vaping Use: Never used  Substance and Sexual Activity   Alcohol use: Yes    Alcohol/week: 0.0 standard drinks    Comment: rare   Drug use: No   Sexual activity: Yes    Partners: Male    Comment: 1st intercourse- 31, partners- 83, married- 10 yrs  Other Topics Concern   Not  on file  Social History Narrative   Not on file   Social Determinants of Health   Financial Resource Strain: Not on file  Food Insecurity: Not on file  Transportation Needs: Not on file  Physical Activity: Not on file  Stress: Not on file  Social Connections: Not on file    Review of Systems Per HPI  Objective:  BP 112/70    Pulse 81    Temp 98.6 F (37 C)    Wt 209 lb 3.2 oz (94.9 kg)    LMP 05/23/2021 (Exact Date)    SpO2 100%    BMI 34.81 kg/m   BP/Weight 06/17/2021 06/03/2021 2/83/1517  Systolic BP 616 073 710  Diastolic BP 70 90 82  Wt. (Lbs) 209.2 210 216.6  BMI 34.81 34.95 36.04    Physical Exam Vitals and nursing note reviewed.  Constitutional:      General: She is not in acute distress.    Appearance: Normal appearance. She is obese. She is not ill-appearing.  Cardiovascular:     Rate and Rhythm: Normal rate and regular rhythm.  Pulmonary:     Effort: Pulmonary effort is normal.     Breath sounds: Normal breath sounds. No wheezing, rhonchi or rales.  Neurological:     Mental Status:  She is alert.  Psychiatric:        Mood and Affect: Mood normal.        Behavior: Behavior normal.    Lab Results  Component Value Date   WBC 10.4 05/22/2016   HGB 11.6 (L) 05/22/2016   HCT 34.0 (L) 05/22/2016   PLT 277 05/22/2016   GLUCOSE 89 11/11/2017   CHOL 176 11/11/2017   TRIG 63 11/11/2017   HDL 56 11/11/2017   LDLCALC 107 (H) 11/11/2017   ALT 53 (H) 11/11/2017   AST 39 11/11/2017   NA 140 11/11/2017   K 5.1 11/11/2017   CL 106 11/11/2017   CREATININE 0.88 11/11/2017   BUN 16 11/11/2017   CO2 22 11/11/2017   TSH 1.740 12/29/2016   HGBA1C 5.3 01/15/2016     Assessment & Plan:   Problem List Items Addressed This Visit       Other   Obesity (BMI 30.0-34.9)    We discussed dietary changes.  We also discussed newer medications that are on the market.  She is willing to try [facility].  We will send into the pharmacy and see if it gets approved.       Hyperlipidemia    Patient is low risk at this time.  Advised dietary changes and exercise.  We discussed potential pharmacotherapy but she does not wish to proceed at this time.       Meds ordered this encounter  Medications   Semaglutide-Weight Management 0.25 MG/0.5ML SOAJ    Sig: Inject 0.25 mg into the skin once a week.    Dispense:  2 mL    Refill:  [facility]

## 2021-06-17 NOTE — Assessment & Plan Note (Signed)
Patient is low risk at this time.  Advised dietary changes and exercise.  We discussed potential pharmacotherapy but she does not wish to proceed at this time.

## 2021-06-20 ENCOUNTER — Other Ambulatory Visit (HOSPITAL_COMMUNITY): Payer: Self-pay

## 2021-06-27 ENCOUNTER — Other Ambulatory Visit (HOSPITAL_COMMUNITY): Payer: Self-pay

## 2021-06-30 ENCOUNTER — Other Ambulatory Visit (HOSPITAL_COMMUNITY): Payer: Self-pay

## 2021-07-04 ENCOUNTER — Other Ambulatory Visit (HOSPITAL_COMMUNITY): Payer: Self-pay

## 2021-07-07 ENCOUNTER — Other Ambulatory Visit (HOSPITAL_COMMUNITY): Payer: Self-pay

## 2021-08-04 ENCOUNTER — Other Ambulatory Visit: Payer: Self-pay | Admitting: Family Medicine

## 2021-08-04 ENCOUNTER — Other Ambulatory Visit (HOSPITAL_COMMUNITY): Payer: Self-pay

## 2021-08-05 ENCOUNTER — Other Ambulatory Visit: Payer: Self-pay | Admitting: Family Medicine

## 2021-08-05 ENCOUNTER — Other Ambulatory Visit (HOSPITAL_COMMUNITY): Payer: Self-pay

## 2021-08-05 MED ORDER — SEMAGLUTIDE-WEIGHT MANAGEMENT 0.5 MG/0.5ML ~~LOC~~ SOAJ
0.5000 mg | SUBCUTANEOUS | 0 refills | Status: DC
  Filled 2021-08-05: qty 2, 28d supply, fill #0

## 2021-08-26 ENCOUNTER — Other Ambulatory Visit (HOSPITAL_COMMUNITY): Payer: Self-pay

## 2021-08-26 ENCOUNTER — Other Ambulatory Visit: Payer: Self-pay | Admitting: Family Medicine

## 2021-08-26 MED ORDER — SEMAGLUTIDE-WEIGHT MANAGEMENT 1 MG/0.5ML ~~LOC~~ SOAJ
1.0000 mg | SUBCUTANEOUS | 0 refills | Status: DC
  Filled 2021-08-26: qty 2, 28d supply, fill #0

## 2021-09-18 ENCOUNTER — Ambulatory Visit (INDEPENDENT_AMBULATORY_CARE_PROVIDER_SITE_OTHER): Payer: No Typology Code available for payment source | Admitting: Family Medicine

## 2021-09-18 ENCOUNTER — Other Ambulatory Visit (HOSPITAL_COMMUNITY): Payer: Self-pay

## 2021-09-18 DIAGNOSIS — E669 Obesity, unspecified: Secondary | ICD-10-CM | POA: Diagnosis not present

## 2021-09-18 MED ORDER — SEMAGLUTIDE-WEIGHT MANAGEMENT 1.7 MG/0.75ML ~~LOC~~ SOAJ
1.7000 mg | SUBCUTANEOUS | 1 refills | Status: DC
  Filled 2021-09-18: qty 3, 28d supply, fill #0
  Filled 2021-10-14: qty 3, 28d supply, fill #1

## 2021-09-18 NOTE — Patient Instructions (Signed)
Follow up in 3 months. ? ?I have increased the dose. ? ?Take care ? ?Dr. Lacinda Axon  ?

## 2021-09-19 NOTE — Progress Notes (Signed)
? ?Subjective:  ?Patient ID: Tanya Sanders, adult    DOB: 02-26-1986  Age: 36 y.o. MRN: [id number] ? ?CC: ?Chief Complaint  ?Patient presents with  ? weight management and refills  ? ? ?HPI: ?36 year old female presents for follow up regarding obesity/weight loss. ? ?Patient is doing well on Wegovy. Currently on 1 mg dose. Has lost 25 lbs since last visit. Has had some nausea with Wegovy but manageable. No other symptoms. She is happy with her progress and response to the medication. ? ? ?Patient Active Problem List  ? Diagnosis Date Noted  ? Obesity (BMI 30.0-34.9) 06/17/2021  ? Hyperlipidemia 06/17/2021  ? Polycystic ovarian syndrome 08/12/2014  ? ? ?Social Hx   ?Social History  ? ?Socioeconomic History  ? Marital status: Single  ?  Spouse name: Not on file  ? Number of children: Not on file  ? Years of education: Not on file  ? Highest education level: Not on file  ?Occupational History  ? Occupation: Therapist, sports  ?  Employer: [employer]  ?Tobacco Use  ? Smoking status: Former  ?  Packs/day: 1.00  ?  Years: 11.00  ?  Pack years: 11.00  ?  Types: Cigarettes  ?  Start date: 10/26/2001  ?  Quit date: 08/22/2012  ?  Years since quitting: 9.0  ? Smokeless tobacco: Never  ?Vaping Use  ? Vaping Use: Never used  ?Substance and Sexual Activity  ? Alcohol use: Yes  ?  Alcohol/week: 0.0 standard drinks  ?  Comment: rare  ? Drug use: No  ? Sexual activity: Yes  ?  Partners: Male  ?  Comment: 1st intercourse- 22, partners- 46, married- 67 yrs  ?Other Topics Concern  ? Not on file  ?Social History Narrative  ? Not on file  ? ?Social Determinants of Health  ? ?Financial Resource Strain: Not on file  ?Food Insecurity: Not on file  ?Transportation Needs: Not on file  ?Physical Activity: Not on file  ?Stress: Not on file  ?Social Connections: Not on file  ? ? ?Review of Systems  ?Constitutional: Negative.   ?Gastrointestinal:  Positive for nausea. Negative for abdominal pain.  ? ?Objective:  ?BP 120/86   Pulse 71   Temp 97.7 ?F  (36.5 ?C)   Ht '5\' 5"'$  (1.651 m)   Wt 184 lb (83.5 kg)   SpO2 100%   BMI 30.62 kg/m?  ? ? ?  09/18/2021  ?  8:22 AM 06/17/2021  ? 10:55 AM 06/03/2021  ?  8:28 AM  ?BP/Weight  ?Systolic BP 962 952 841  ?Diastolic BP 86 70 90  ?Wt. (Lbs) 184 209.2 210  ?BMI 30.62 kg/m2 34.81 kg/m2 34.95 kg/m2  ? ? ?Physical Exam ?Vitals and nursing note reviewed.  ?Constitutional:   ?   General: She is not in acute distress. ?   Appearance: Normal appearance.  ?HENT:  ?   Head: Normocephalic and atraumatic.  ?Eyes:  ?   General:     ?   Right eye: No discharge.     ?   Left eye: No discharge.  ?   Conjunctiva/sclera: Conjunctivae normal.  ?Cardiovascular:  ?   Rate and Rhythm: Normal rate and regular rhythm.  ?Pulmonary:  ?   Effort: Pulmonary effort is normal.  ?   Breath sounds: Normal breath sounds. No wheezing, rhonchi or rales.  ?Neurological:  ?   Mental Status: She is alert.  ? ? ?Lab Results  ?Component Value Date  ?  WBC 10.4 05/22/2016  ? HGB 11.6 (L) 05/22/2016  ? HCT 34.0 (L) 05/22/2016  ? PLT 277 05/22/2016  ? GLUCOSE 89 11/11/2017  ? CHOL 176 11/11/2017  ? TRIG 63 11/11/2017  ? HDL 56 11/11/2017  ? LDLCALC 107 (H) 11/11/2017  ? ALT 53 (H) 11/11/2017  ? AST 39 11/11/2017  ? NA 140 11/11/2017  ? K 5.1 11/11/2017  ? CL 106 11/11/2017  ? CREATININE 0.88 11/11/2017  ? BUN 16 11/11/2017  ? CO2 22 11/11/2017  ? TSH 1.740 12/29/2016  ? HGBA1C 5.3 01/15/2016  ? ? ? ?Assessment & Plan:  ? ?Problem List Items Addressed This Visit   ? ?  ? Other  ? Obesity (BMI 30.0-34.9)  ?  Doing well on Wegovy. Increasing dose to 1.7 mg. Follow up in 3 months. ? ?  ?  ? ? ?Meds ordered this encounter  ?Medications  ? Semaglutide-Weight Management 1.7 MG/0.75ML SOAJ  ?  Sig: Inject 1.7 mg into the skin once a week.  ?  Dispense:  3 mL  ?  Refill:  1  ? ? ?Follow-up:  Return in about 3 months (around 12/18/2021). ? ?Thersa Salt DO ?[facility] ? ?

## 2021-09-19 NOTE — Assessment & Plan Note (Signed)
Doing well on Wegovy. Increasing dose to 1.7 mg. Follow up in 3 months. ?

## 2021-10-14 ENCOUNTER — Other Ambulatory Visit (HOSPITAL_COMMUNITY): Payer: Self-pay

## 2021-10-21 ENCOUNTER — Other Ambulatory Visit (HOSPITAL_COMMUNITY): Payer: Self-pay

## 2021-11-18 ENCOUNTER — Other Ambulatory Visit: Payer: Self-pay | Admitting: Family Medicine

## 2021-11-19 ENCOUNTER — Other Ambulatory Visit (HOSPITAL_COMMUNITY): Payer: Self-pay

## 2021-11-19 MED ORDER — WEGOVY 1.7 MG/0.75ML ~~LOC~~ SOAJ
1.7000 mg | SUBCUTANEOUS | 1 refills | Status: DC
Start: 2021-11-19 — End: 2021-12-18
  Filled 2021-11-19: qty 3, 28d supply, fill #0

## 2021-11-23 ENCOUNTER — Other Ambulatory Visit (HOSPITAL_COMMUNITY): Payer: Self-pay

## 2021-12-18 ENCOUNTER — Other Ambulatory Visit (HOSPITAL_COMMUNITY): Payer: Self-pay

## 2021-12-18 ENCOUNTER — Ambulatory Visit (INDEPENDENT_AMBULATORY_CARE_PROVIDER_SITE_OTHER): Payer: No Typology Code available for payment source | Admitting: Family Medicine

## 2021-12-18 DIAGNOSIS — E669 Obesity, unspecified: Secondary | ICD-10-CM

## 2021-12-18 MED ORDER — WEGOVY 1.7 MG/0.75ML ~~LOC~~ SOAJ
1.7000 mg | SUBCUTANEOUS | 3 refills | Status: DC
  Filled 2021-12-18: qty 3, 28d supply, fill #0
  Filled 2022-01-08: qty 3, 28d supply, fill #1
  Filled 2022-02-08: qty 3, 28d supply, fill #2
  Filled 2022-03-15: qty 3, 28d supply, fill #3

## 2021-12-18 NOTE — Progress Notes (Signed)
Subjective:  Patient ID: Tanya Sanders, adult    DOB: [birth year]  Age: 36 y.o. MRN: [id number]  CC: Chief Complaint  Patient presents with   Follow-up    3 month follow up on Wegovy    HPI:  36 year old female presents for follow-up regarding obesity and weight loss medication.  Patient is on Wegovy.  Currently on 1.7 mg weekly.  She is doing very well.  She has went from 209 pounds in January to 168 pounds currently.  BMI has now decreased to 28.  Patient states that she is feeling well.  She does have some occasional nausea associated with the medication.  Overall, she feels like she is doing very well.  She is very pleased with the weight loss.  She would like to continue Wegovy at the current dose.  Patient Active Problem List   Diagnosis Date Noted   Obesity (BMI 30.0-34.9) 06/17/2021   Hyperlipidemia 06/17/2021   Polycystic ovarian syndrome 08/12/2014    Social Hx   Social History   Socioeconomic History   Marital status: Single    Spouse name: Not on file   Number of children: Not on file   Years of education: Not on file   Highest education level: Not on file  Occupational History   Occupation: Therapist, sports    Employer: [employer]  Tobacco Use   Smoking status: Former    Packs/day: 1.00    Years: 11.00    Total pack years: 11.00    Types: Cigarettes    Start date: 10/26/2001    Quit date: 08/22/2012    Years since quitting: 9.3   Smokeless tobacco: Never  Vaping Use   Vaping Use: Never used  Substance and Sexual Activity   Alcohol use: Yes    Alcohol/week: 0.0 standard drinks of alcohol    Comment: rare   Drug use: No   Sexual activity: Yes    Partners: Male    Comment: 1st intercourse- 89, partners- 64, married- 77 yrs  Other Topics Concern   Not on file  Social History Narrative   Not on file   Social Determinants of Health   Financial Resource Strain: Not on file  Food Insecurity: Not on file  Transportation Needs: Not on file  Physical  Activity: Not on file  Stress: Not on file  Social Connections: Not on file    Review of Systems Per HPI  Objective:  BP 116/77   Pulse 80   Temp (!) 97.3 F (36.3 C) (Oral)   Ht '5\' 5"'$  (1.651 m)   Wt 168 lb 12.8 oz (76.6 kg)   SpO2 99%   BMI 28.09 kg/m      12/18/2021    8:20 AM 09/18/2021    8:22 AM 06/17/2021   10:55 AM  BP/Weight  Systolic BP 588 502 774  Diastolic BP 77 86 70  Wt. (Lbs) 168.8 184 209.2  BMI 28.09 kg/m2 30.62 kg/m2 34.81 kg/m2    Physical Exam Vitals and nursing note reviewed.  Constitutional:      General: She is not in acute distress.    Appearance: Normal appearance.  HENT:     Head: Normocephalic and atraumatic.  Eyes:     General:        Right eye: No discharge.        Left eye: No discharge.     Conjunctiva/sclera: Conjunctivae normal.  Cardiovascular:     Rate and Rhythm: Normal rate and regular rhythm.  Pulmonary:     Effort: Pulmonary effort is normal.     Breath sounds: Normal breath sounds. No wheezing, rhonchi or rales.  Abdominal:     General: There is no distension.     Palpations: Abdomen is soft.     Tenderness: There is no abdominal tenderness.  Neurological:     Mental Status: She is alert.  Psychiatric:        Mood and Affect: Mood normal.        Behavior: Behavior normal.     Lab Results  Component Value Date   WBC 10.4 05/22/2016   HGB 11.6 (L) 05/22/2016   HCT 34.0 (L) 05/22/2016   PLT 277 05/22/2016   GLUCOSE 89 11/11/2017   CHOL 176 11/11/2017   TRIG 63 11/11/2017   HDL 56 11/11/2017   LDLCALC 107 (H) 11/11/2017   ALT 53 (H) 11/11/2017   AST 39 11/11/2017   NA 140 11/11/2017   K 5.1 11/11/2017   CL 106 11/11/2017   CREATININE 0.88 11/11/2017   BUN 16 11/11/2017   CO2 22 11/11/2017   TSH 1.740 12/29/2016   HGBA1C 5.3 01/15/2016     Assessment & Plan:   Problem List Items Addressed This Visit       Other   Obesity (BMI 30.0-34.9)    Patient has had significant weight loss on Wegovy.  She  is doing well.  We will continue.  Discussed increasing to 2.5 mg weekly.  She will consider.  For now we will continue at current dose.       Meds ordered this encounter  Medications   Semaglutide-Weight Management (WEGOVY) 1.7 MG/0.75ML SOAJ    Sig: Inject 1.7 mg into the skin once a week.    Dispense:  3 mL    Refill:  3    Follow-up:  6 months  [place]

## 2021-12-18 NOTE — Assessment & Plan Note (Signed)
Patient has had significant weight loss on Wegovy.  She is doing well.  We will continue.  Discussed increasing to 2.5 mg weekly.  She will consider.  For now we will continue at current dose.

## 2021-12-18 NOTE — Patient Instructions (Signed)
I have refilled the medicine.  If you decide you want to go up to 2.4 send me a message.  Follow up in 6 months.  Take care  Dr. Lacinda Axon

## 2021-12-30 ENCOUNTER — Encounter (INDEPENDENT_AMBULATORY_CARE_PROVIDER_SITE_OTHER): Payer: Self-pay

## 2022-01-08 ENCOUNTER — Other Ambulatory Visit (HOSPITAL_COMMUNITY): Payer: Self-pay

## 2022-01-09 ENCOUNTER — Other Ambulatory Visit (HOSPITAL_COMMUNITY): Payer: Self-pay

## 2022-01-13 ENCOUNTER — Encounter: Payer: Self-pay | Admitting: Obstetrics & Gynecology

## 2022-01-13 ENCOUNTER — Ambulatory Visit (INDEPENDENT_AMBULATORY_CARE_PROVIDER_SITE_OTHER): Payer: No Typology Code available for payment source | Admitting: Obstetrics & Gynecology

## 2022-01-13 VITALS — BP 110/72 | HR 83 | Ht 65.0 in | Wt 167.0 lb

## 2022-01-13 DIAGNOSIS — Z01419 Encounter for gynecological examination (general) (routine) without abnormal findings: Secondary | ICD-10-CM | POA: Diagnosis not present

## 2022-01-13 DIAGNOSIS — Z9189 Other specified personal risk factors, not elsewhere classified: Secondary | ICD-10-CM

## 2022-01-13 DIAGNOSIS — N920 Excessive and frequent menstruation with regular cycle: Secondary | ICD-10-CM

## 2022-01-13 NOTE — Progress Notes (Signed)
Tanya Sanders 1986/05/22 [id number]   History:    36 y.o. G2P2L2 Married.  Vasectomy.  Daughter 58 yo, son 28+ yo.   RP:  Established patient presenting for annual gyn exam    HPI: Menstrual periods every month, still with heavy flow x 7 days.  Continues to have severe dysmenorrhea.  No breakthrough bleeding.  No pelvic pain.  No pain with intercourse.  Pap Neg 12/2020.  Normal Paps x >15 years, had a Colpo, but no treatment required. Urine and bowel movements normal.  Breasts normal.  Body mass index decreased to 27.79 on Wegovy.  Healthy nutrition.  Good muscle mass.  Patient is fit with aerobic activities regularly and weightlifting 2-3 times a week.    Past medical history,surgical history, family history and social history were all reviewed and documented in the EPIC chart.  Gynecologic History Patient's last menstrual period was 01/07/2022 (exact date).  Obstetric History OB History  Gravida Para Term Preterm AB Living  '2 2 1     2  '$ SAB IAB Ectopic Multiple Live Births          2    # Outcome Date GA Lbr Len/2nd Weight Sex Delivery Anes PTL Lv  2 Para 01/31/14   7 lb 15 oz (3.6 kg) M CS-LTranv Spinal  LIV  1 Term              ROS: A ROS was performed and pertinent positives and negatives are included in the history. GENERAL: No fevers or chills. HEENT: No change in vision, no earache, sore throat or sinus congestion. NECK: No pain or stiffness. CARDIOVASCULAR: No chest pain or pressure. No palpitations. PULMONARY: No shortness of breath, cough or wheeze. GASTROINTESTINAL: No abdominal pain, nausea, vomiting or diarrhea, melena or bright red blood per rectum. GENITOURINARY: No urinary frequency, urgency, hesitancy or dysuria. MUSCULOSKELETAL: No joint or muscle pain, no back pain, no recent trauma. DERMATOLOGIC: No rash, no itching, no lesions. ENDOCRINE: No polyuria, polydipsia, no heat or cold intolerance. No recent change in weight. HEMATOLOGICAL: No anemia or easy bruising  or bleeding. NEUROLOGIC: No headache, seizures, numbness, tingling or weakness. PSYCHIATRIC: No depression, no loss of interest in normal activity or change in sleep pattern.     Exam:   BP 110/72   Pulse 83   Ht '5\' 5"'$  (1.651 m)   Wt 167 lb (75.8 kg)   LMP 01/07/2022 (Exact Date)   SpO2 98%   BMI 27.79 kg/m   Body mass index is 27.79 kg/m.  General appearance : Well developed well nourished female. No acute distress HEENT: Eyes: no retinal hemorrhage or exudates,  Neck supple, trachea midline, no carotid bruits, no thyroidmegaly Lungs: Clear to auscultation, no rhonchi or wheezes, or rib retractions  Heart: Regular rate and rhythm, no murmurs or gallops Breast:Examined in sitting and supine position were symmetrical in appearance, no palpable masses or tenderness,  no skin retraction, no nipple inversion, no nipple discharge, no skin discoloration, no axillary or supraclavicular lymphadenopathy Abdomen: no palpable masses or tenderness, no rebound or guarding Extremities: no edema or skin discoloration or tenderness  Pelvic: Vulva: Normal             Vagina: No gross lesions or discharge  Cervix: No gross lesions or discharge  Uterus  AV, normal size, shape and consistency, non-tender and mobile  Adnexa  Without masses or tenderness  Anus: Normal   Assessment/Plan:  36 y.o. female for annual exam   1.  Well female exam with routine gynecological exam Menstrual periods every month, still with heavy flow x 7 days.  Continues to have severe dysmenorrhea.  No breakthrough bleeding.  No pelvic pain.  No pain with intercourse.  Pap Neg 12/2020.  Normal Paps x >15 years, had a Colpo, but no treatment required. Urine and bowel movements normal.  Breasts normal.  Body mass index decreased to 27.79 on Wegovy.  Healthy nutrition.  Good muscle mass.  Patient is fit with aerobic activities regularly and weightlifting 2-3 times a week.    2. Relies on partner vasectomy for contraception  3.  Menorrhagia with regular cycle Heavy menses x 7 days every 28-30 days with severe dysmenorrhea.  Relatively stable and declines hormonal therapy. No h/o anemia.  Normal Gyn exam.  Will further investigate with a Pelvic US at f/u. - US Transvaginal Non-OB; Future   Princess Bruins MD, 8:13 AM 01/13/2022

## 2022-01-14 ENCOUNTER — Ambulatory Visit (INDEPENDENT_AMBULATORY_CARE_PROVIDER_SITE_OTHER): Payer: No Typology Code available for payment source

## 2022-01-14 ENCOUNTER — Ambulatory Visit (INDEPENDENT_AMBULATORY_CARE_PROVIDER_SITE_OTHER): Payer: No Typology Code available for payment source | Admitting: Obstetrics & Gynecology

## 2022-01-14 ENCOUNTER — Encounter: Payer: Self-pay | Admitting: Obstetrics & Gynecology

## 2022-01-14 VITALS — BP 116/64 | HR 70 | Resp 16

## 2022-01-14 DIAGNOSIS — D219 Benign neoplasm of connective and other soft tissue, unspecified: Secondary | ICD-10-CM | POA: Diagnosis not present

## 2022-01-14 DIAGNOSIS — N8003 Adenomyosis of the uterus: Secondary | ICD-10-CM | POA: Diagnosis not present

## 2022-01-14 DIAGNOSIS — N920 Excessive and frequent menstruation with regular cycle: Secondary | ICD-10-CM

## 2022-01-14 NOTE — Progress Notes (Signed)
    Tanya Sanders [birth year] [id number]        36 y.o.  G2P2L2 Married.  Vasectomy.  RP: Menorrhagia with regular cycles for Pelvic US  HPI: Menstrual periods every month, still with heavy flow x 7 days.  Continues to have severe dysmenorrhea.  No breakthrough bleeding.  No pelvic pain.  No pain with intercourse.    OB History  Gravida Para Term Preterm AB Living  '2 2 1     2  '$ SAB IAB Ectopic Multiple Live Births          2    # Outcome Date GA Lbr Len/2nd Weight Sex Delivery Anes PTL Lv  2 Para 01/31/14   7 lb 15 oz (3.6 kg) M CS-LTranv Spinal  LIV  1 Term             Past medical history,surgical history, problem list, medications, allergies, family history and social history were all reviewed and documented in the EPIC chart.   Directed ROS with pertinent positives and negatives documented in the history of present illness/assessment and plan.  Exam:  Vitals:   01/14/22 0904  BP: 116/64  Pulse: 70  Resp: 16   General appearance:  Normal  Pelvic US today: T/V images.  Anteverted uterus normal size and shape with a small fundal subserosal fibroid measured at 2 cm x 1.4 cm.  Slightly it 0 generous myometrium with streaky shadowing suggestive of mild adenomyosis.  The uterus is measured at 8.55 x 5.55 x 4.64 cm.  The endometrial lining is tri-layered, symmetrical, measured at 5.54 mm with no obvious mass or thickening.  Both ovaries are mobile, mildly enlarged, with a multi follicular pattern and normal perfusion.   Assessment/Plan:  36 y.o. G2P1002   1. Menorrhagia with regular cycle Menstrual periods every month, still with heavy flow x 7 days.  Continues to have severe dysmenorrhea.  No breakthrough bleeding.  No pelvic pain.  No pain with intercourse. Vasectomy. Pelvic US findings reviewed with patient, SS Fibroid 2 cm and probable Adenomyosis, thin normal endometrial line at 5.54 mm with no mass or thickening.  Ovaries with follicles wnl. Declines  hormonal/progestin therapies.  Decision to use Ibuprofen regularly with periods.  Bleeding/pain precautions reviewed, will reassess if any change/worsening occurs.  2. Adenomyosis of uterus Counseling on Adenomyosis.  Will use Ibuprofen during the menstrual periods at this time.  3. Fibroid  2 cm SS Fibroid.  Princess Bruins MD, 9:30 AM 01/14/2022

## 2022-01-15 ENCOUNTER — Other Ambulatory Visit (HOSPITAL_COMMUNITY): Payer: Self-pay

## 2022-01-18 ENCOUNTER — Telehealth (HOSPITAL_COMMUNITY): Payer: Self-pay | Admitting: Internal Medicine

## 2022-01-18 ENCOUNTER — Ambulatory Visit
Admission: EM | Admit: 2022-01-18 | Discharge: 2022-01-18 | Disposition: A | Discharge status: Home / Self Care | Payer: No Typology Code available for payment source | Attending: Nurse Practitioner | Admitting: Nurse Practitioner

## 2022-01-18 ENCOUNTER — Other Ambulatory Visit (HOSPITAL_COMMUNITY): Payer: Self-pay

## 2022-01-18 DIAGNOSIS — R399 Unspecified symptoms and signs involving the genitourinary system: Secondary | ICD-10-CM | POA: Diagnosis present

## 2022-01-18 LAB — POCT URINALYSIS DIP (MANUAL ENTRY)
Bilirubin, UA: NEGATIVE
Glucose, UA: NEGATIVE mg/dL
Ketones, POC UA: NEGATIVE mg/dL
Nitrite, UA: NEGATIVE
Protein Ur, POC: NEGATIVE mg/dL
Spec Grav, UA: 1.01 (ref 1.010–1.025)
Urobilinogen, UA: 0.2 E.U./dL
pH, UA: 6.5 (ref 5.0–8.0)

## 2022-01-18 MED ORDER — PHENAZOPYRIDINE HCL 200 MG PO TABS
200.0000 mg | ORAL_TABLET | Freq: Three times a day (TID) | ORAL | 0 refills | Status: DC | PRN
  Filled 2022-01-18: qty 15, 5d supply, fill #0

## 2022-01-18 MED ORDER — NITROFURANTOIN MONOHYD MACRO 100 MG PO CAPS
100.0000 mg | ORAL_CAPSULE | Freq: Two times a day (BID) | ORAL | 0 refills | Status: DC
  Filled 2022-01-18: qty 10, 5d supply, fill #0

## 2022-01-18 MED ORDER — PHENAZOPYRIDINE HCL 100 MG PO TABS
100.0000 mg | ORAL_TABLET | Freq: Three times a day (TID) | ORAL | 0 refills | Status: DC | PRN
  Filled 2022-01-18 (×2): qty 10, 4d supply, fill #0

## 2022-01-18 NOTE — Discharge Instructions (Signed)
-  The urinalysis is positive for leukocytes and blood.  A urine culture is pending at this time.  If your urine culture results are negative, you will be contacted and asked to stop the medication.  If you have access to MyChart, you will be able to see results there. -Take medications as prescribed. -Increase fluids. -Ibuprofen or Tylenol for pain, fever, or general discomfort. -Develop a toileting schedule that will allow you to toilet at least every 2 hours. -Avoid caffeine to include tea, soda, and coffee. -If sexually active, void at least 15 to 20 minutes after sexual intercourse. -Follow-up in the emergency department if you develop fever, chills, worsening abdominal pain, or other concerns.

## 2022-01-18 NOTE — ED Triage Notes (Signed)
Pt presents with c/o urinary frequency since Thursday

## 2022-01-18 NOTE — Telephone Encounter (Signed)
New prescription for Pyridium sent to pharmacy on file.

## 2022-01-18 NOTE — ED Provider Notes (Signed)
[facility]    CSN: [id number] Arrival date & time: 01/18/22  2330      History   Chief Complaint Chief Complaint  Patient presents with   Urinary Frequency    Entered by patient    HPI Tanya Sanders is a 36 y.o. adult.   The history is provided by the patient.   Patient presents for urinary tract infection symptoms.  Patient states symptoms started approximately 4 days ago.  Patient complains of urinary frequency, urgency, and burning with urination.  Patient denies fever, chills, abdominal pain, low back pain, hematuria, foul-smelling urine, or cloudy urine.  Patient reports that she does have a history of recurrent urinary tract infections.  She states that she was "treated" in this clinic earlier this year for UTI.  She states that she has had at least 2 or 3 undocumented urinary tract infections for which she medicated at home with previous antibiotic prescriptions.  Patient reports that in the past she has seen [facility] and placed on prophylactic antibiotics for prevention/recurrent UTIs.  Patient states when her symptoms started, she increased her water intake and also started cranberry juice.  She states however over the past 24 hours, her symptoms have seemed to worsen.  Past Medical History:  Diagnosis Date   Abscess of tonsil     Patient Active Problem List   Diagnosis Date Noted   Obesity (BMI 30.0-34.9) 06/17/2021   Hyperlipidemia 06/17/2021   Polycystic ovarian syndrome 08/12/2014    Past Surgical History:  Procedure Laterality Date   CESAREAN SECTION     CESAREAN SECTION N/A 01/31/2014   Procedure: Repeat CESAREAN SECTION;  Surgeon: Princess Bruins, MD;  Location: [facility];  Service: Obstetrics;  Laterality: N/A;  EDD: 02/06/14   CHOLECYSTECTOMY N/A 09/22/2012   Procedure: LAPAROSCOPIC CHOLECYSTECTOMY;  Surgeon: Scherry Ran, MD;  Location: [facility];  Service: General;  Laterality: N/A;   ERCP N/A 10/06/2012   Procedure:  ENDOSCOPIC RETROGRADE CHOLANGIOPANCREATOGRAPHY (ERCP);  Surgeon: Daneil Dolin, MD;  Location: [facility];  Service: Gastroenterology;  Laterality: N/A;   EYE SURGERY  09/19/2020   LASIK Bilateral    08/2020   REMOVAL OF STONES N/A 10/06/2012   Procedure: REMOVAL OF STONES;  Surgeon: Daneil Dolin, MD;  Location: [facility];  Service: Gastroenterology;  Laterality: N/A;   SPHINCTEROTOMY N/A 10/06/2012   Procedure: SPHINCTEROTOMY;  Surgeon: Daneil Dolin, MD;  Location: [facility];  Service: Gastroenterology;  Laterality: N/A;   TONSILLECTOMY N/A 06/03/2016   Procedure: TONSILLECTOMY;  Surgeon: Izora Gala, MD;  Location: [facility];  Service: ENT;  Laterality: N/A;    OB History     Gravida  2   Para  2   Term  1   Preterm      AB      Living  2      SAB      IAB      Ectopic      Multiple      Live Births  2            Home Medications    Prior to Admission medications   Medication Sig Start Date End Date Taking? Authorizing Provider  nitrofurantoin, macrocrystal-monohydrate, (MACROBID) 100 MG capsule Take 1 capsule (100 mg total) by mouth 2 (two) times daily. 01/18/22  Yes Davonda Ausley-Warren, Alda Lea, NP  phenazopyridine (PYRIDIUM) 100 MG tablet Take 1 tablet (100 mg total) by mouth 3 (three) times daily as needed for pain. 01/18/22  Yes Daley Mooradian-Warren, Alda Lea, NP  Semaglutide-Weight Management (WEGOVY) 1.7 MG/0.75ML SOAJ Inject 1.7 mg into the skin once a week. 12/18/21   Coral Spikes, DO    Family History Family History  Problem Relation Age of Onset   Hypertension Father    COPD Father    Lung cancer Father     Social History Social History   Tobacco Use   Smoking status: Former    Packs/day: 1.00    Years: 11.00    Total pack years: 11.00    Types: Cigarettes    Start date: 10/26/2001    Quit date: 08/22/2012    Years since quitting: 9.4   Smokeless tobacco: Never  Vaping Use   Vaping Use: Never used  Substance Use Topics   Alcohol use: Not Currently     Comment: a couple a week   Drug use: No     Allergies   Phentermine, Bactrim [sulfamethoxazole-trimethoprim], Penicillins, and Sulfa antibiotics   Review of Systems Review of Systems Per HPI  Physical Exam Triage Vital Signs ED Triage Vitals  Enc Vitals Group     BP 01/18/22 0833 115/81     Pulse Rate 01/18/22 0833 82     Resp 01/18/22 0833 20     Temp 01/18/22 0833 97.9 F (36.6 C)     Temp src --      SpO2 01/18/22 0833 99 %     Weight --      Height --      Head Circumference --      Peak Flow --      Pain Score 01/18/22 0830 0     Pain Loc --      Pain Edu? --      Excl. in [place]? --    No data found.  Updated Vital Signs BP 115/81   Pulse 82   Temp 97.9 F (36.6 C)   Resp 20   LMP 01/07/2022 (Exact Date)   SpO2 99%   Visual Acuity Right Eye Distance:   Left Eye Distance:   Bilateral Distance:    Right Eye Near:   Left Eye Near:    Bilateral Near:     Physical Exam Vitals and nursing note reviewed.  Constitutional:      General: She is not in acute distress.    Appearance: Normal appearance. She is well-developed.  Eyes:     Extraocular Movements: Extraocular movements intact.     Conjunctiva/sclera: Conjunctivae normal.     Pupils: Pupils are equal, round, and reactive to light.  Cardiovascular:     Rate and Rhythm: Regular rhythm.     Heart sounds: Normal heart sounds.  Pulmonary:     Effort: Pulmonary effort is normal.     Breath sounds: Normal breath sounds.  Abdominal:     General: Bowel sounds are normal. There is no distension.     Palpations: Abdomen is soft.     Tenderness: There is no abdominal tenderness. There is no right CVA tenderness, left CVA tenderness, guarding or rebound.  Genitourinary:    Vagina: Normal. No vaginal discharge.  Skin:    General: Skin is warm and dry.     Findings: No erythema or rash.  Neurological:     General: No focal deficit present.     Mental Status: She is alert and oriented to person, place,  and time.     Cranial Nerves: No cranial nerve deficit.  Psychiatric:  Mood and Affect: Mood normal.        Behavior: Behavior normal.      UC Treatments / Results  Labs (all labs ordered are listed, but only abnormal results are displayed) Labs Reviewed  POCT URINALYSIS DIP (MANUAL ENTRY) - Abnormal; Notable for the following components:      Result Value   Clarity, UA cloudy (*)    Blood, UA large (*)    Leukocytes, UA Small (1+) (*)    All other components within normal limits  URINE CULTURE    EKG   Radiology No results found.  Procedures Procedures (including critical care time)  Medications Ordered in UC Medications - No data to display  Initial Impression / Assessment and Plan / UC Course  I have reviewed the triage vital signs and the nursing notes.  Pertinent labs & imaging results that were available during my care of the patient were reviewed by me and considered in my medical decision making (see chart for details).  Patient presents for urinary tract infection symptoms that been present for the past 4 days.  On exam, patient's vital signs are stable, she is in no acute distress.  Patient has no CVA tenderness bilaterally.  Her urinalysis is positive for leukocytes and blood.  Urine culture is pending.  Given the patient's history of recurrent urinary tract infections, we will start her on Macrobid and Pyridium.  Patient was advised that if the results of the culture are negative, she will be contacted and asked to stop the medication.  Patient was also advised to follow-up with urology as it appears that she is beginning to have recurrent urinary tract infections that may need prophylactic antibiotic treatment.  Patient is given strict indications of when to go to the emergency department.  Patient advised to follow-up as needed. Final Clinical Impressions(s) / UC Diagnoses   Final diagnoses:  Symptoms of urinary tract infection     Discharge  Instructions      -The urinalysis is positive for leukocytes and blood.  A urine culture is pending at this time.  If your urine culture results are negative, you will be contacted and asked to stop the medication.  If you have access to MyChart, you will be able to see results there. -Take medications as prescribed. -Increase fluids. -Ibuprofen or Tylenol for pain, fever, or general discomfort. -Develop a toileting schedule that will allow you to toilet at least every 2 hours. -Avoid caffeine to include tea, soda, and coffee. -If sexually active, void at least 15 to 20 minutes after sexual intercourse. -Follow-up in the emergency department if you develop fever, chills, worsening abdominal pain, or other concerns.      ED Prescriptions     Medication Sig Dispense Auth. Provider   nitrofurantoin, macrocrystal-monohydrate, (MACROBID) 100 MG capsule Take 1 capsule (100 mg total) by mouth 2 (two) times daily. 10 capsule Caila Cirelli-Warren, Alda Lea, NP   phenazopyridine (PYRIDIUM) 100 MG tablet Take 1 tablet (100 mg total) by mouth 3 (three) times daily as needed for pain. 10 tablet [place] Nolton-Warren, Alda Lea, NP      PDMP not reviewed this encounter.   Tish Men, NP 01/18/22 [phone]

## 2022-01-19 ENCOUNTER — Other Ambulatory Visit (HOSPITAL_COMMUNITY): Payer: Self-pay

## 2022-01-20 LAB — URINE CULTURE: Culture: 60000 — AB

## 2022-02-08 ENCOUNTER — Other Ambulatory Visit (HOSPITAL_COMMUNITY): Payer: Self-pay

## 2022-03-15 ENCOUNTER — Other Ambulatory Visit (HOSPITAL_COMMUNITY): Payer: Self-pay

## 2022-03-25 ENCOUNTER — Other Ambulatory Visit (HOSPITAL_COMMUNITY): Payer: Self-pay

## 2022-04-06 ENCOUNTER — Other Ambulatory Visit: Payer: Self-pay | Admitting: Family Medicine

## 2022-04-06 ENCOUNTER — Encounter: Payer: Self-pay | Admitting: Family Medicine

## 2022-04-06 ENCOUNTER — Other Ambulatory Visit (HOSPITAL_COMMUNITY): Payer: Self-pay

## 2022-04-06 MED ORDER — SEMAGLUTIDE-WEIGHT MANAGEMENT 2.4 MG/0.75ML ~~LOC~~ SOAJ
2.4000 mg | SUBCUTANEOUS | 6 refills | Status: DC
  Filled 2022-04-06: qty 3, 28d supply, fill #0
  Filled 2022-05-03: qty 3, 28d supply, fill #1
  Filled 2022-05-28: qty 3, 28d supply, fill #2

## 2022-04-08 ENCOUNTER — Other Ambulatory Visit (HOSPITAL_COMMUNITY): Payer: Self-pay

## 2022-05-04 ENCOUNTER — Other Ambulatory Visit (HOSPITAL_COMMUNITY): Payer: Self-pay

## 2022-05-15 ENCOUNTER — Encounter (HOSPITAL_COMMUNITY): Payer: Self-pay

## 2022-05-15 ENCOUNTER — Emergency Department (HOSPITAL_COMMUNITY): Payer: No Typology Code available for payment source

## 2022-05-15 ENCOUNTER — Emergency Department (HOSPITAL_COMMUNITY)
Admission: EM | Admit: 2022-05-15 | Discharge: 2022-05-15 | Disposition: A | Discharge status: Home / Self Care | Payer: No Typology Code available for payment source | Attending: Emergency Medicine | Admitting: Emergency Medicine

## 2022-05-15 ENCOUNTER — Other Ambulatory Visit: Payer: Self-pay

## 2022-05-15 DIAGNOSIS — J111 Influenza due to unidentified influenza virus with other respiratory manifestations: Secondary | ICD-10-CM | POA: Diagnosis not present

## 2022-05-15 DIAGNOSIS — R531 Weakness: Secondary | ICD-10-CM | POA: Diagnosis present

## 2022-05-15 DIAGNOSIS — Z1152 Encounter for screening for COVID-19: Secondary | ICD-10-CM | POA: Diagnosis not present

## 2022-05-15 DIAGNOSIS — J189 Pneumonia, unspecified organism: Secondary | ICD-10-CM | POA: Diagnosis not present

## 2022-05-15 LAB — CBC
HCT: 35.5 % — ABNORMAL LOW (ref 36.0–46.0)
Hemoglobin: 12 g/dL (ref 12.0–15.0)
MCH: 28.2 pg (ref 26.0–34.0)
MCHC: 33.8 g/dL (ref 30.0–36.0)
MCV: 83.3 fL (ref 80.0–100.0)
Platelets: 177 10*3/uL (ref 150–400)
RBC: 4.26 MIL/uL (ref 3.87–5.11)
RDW: 12.4 % (ref 11.5–15.5)
WBC: 8.8 10*3/uL (ref 4.0–10.5)
nRBC: 0 % (ref 0.0–0.2)

## 2022-05-15 LAB — URINALYSIS, ROUTINE W REFLEX MICROSCOPIC
Bacteria, UA: NONE SEEN
Bilirubin Urine: NEGATIVE
Glucose, UA: NEGATIVE mg/dL
Hgb urine dipstick: NEGATIVE
Ketones, ur: 80 mg/dL — AB
Leukocytes,Ua: NEGATIVE
Nitrite: NEGATIVE
Protein, ur: 30 mg/dL — AB
Specific Gravity, Urine: 1.025 (ref 1.005–1.030)
pH: 6 (ref 5.0–8.0)

## 2022-05-15 LAB — RESP PANEL BY RT-PCR (RSV, FLU A&B, COVID)  RVPGX2
Influenza A by PCR: POSITIVE — AB
Influenza B by PCR: NEGATIVE
Resp Syncytial Virus by PCR: NEGATIVE
SARS Coronavirus 2 by RT PCR: NEGATIVE

## 2022-05-15 LAB — BASIC METABOLIC PANEL
Anion gap: 7 (ref 5–15)
BUN: 7 mg/dL (ref 6–20)
CO2: 23 mmol/L (ref 22–32)
Calcium: 8.1 mg/dL — ABNORMAL LOW (ref 8.9–10.3)
Chloride: 101 mmol/L (ref 98–111)
Creatinine, Ser: 0.72 mg/dL (ref 0.44–1.00)
GFR, Estimated: >60 mL/min (ref >60)
Glucose, Bld: 148 mg/dL — ABNORMAL HIGH (ref 70–99)
Potassium: 3.6 mmol/L (ref 3.5–5.1)
Sodium: 131 mmol/L — ABNORMAL LOW (ref 135–145)

## 2022-05-15 LAB — POC URINE PREG, ED: Preg Test, Ur: NEGATIVE

## 2022-05-15 LAB — CBG MONITORING, ED: Glucose-Capillary: 137 mg/dL — ABNORMAL HIGH (ref 70–99)

## 2022-05-15 MED ORDER — AZITHROMYCIN 250 MG PO TABS
500.0000 mg | ORAL_TABLET | Freq: Once | ORAL | Status: AC
  Administered 2022-05-15: 500 mg via ORAL
  Filled 2022-05-15: qty 2

## 2022-05-15 MED ORDER — SODIUM CHLORIDE 0.9 % IV BOLUS
1000.0000 mL | Freq: Once | INTRAVENOUS | Status: AC
  Administered 2022-05-15: 1000 mL via INTRAVENOUS

## 2022-05-15 MED ORDER — ACETAMINOPHEN 325 MG PO TABS
650.0000 mg | ORAL_TABLET | Freq: Once | ORAL | Status: AC
  Administered 2022-05-15: 650 mg via ORAL
  Filled 2022-05-15: qty 2

## 2022-05-15 MED ORDER — AZITHROMYCIN 250 MG PO TABS
250.0000 mg | ORAL_TABLET | Freq: Every day | ORAL | 0 refills | Status: DC
  Filled 2022-05-15: qty 4, 4d supply, fill #0

## 2022-05-15 MED ORDER — IBUPROFEN 600 MG PO TABS
600.0000 mg | ORAL_TABLET | Freq: Four times a day (QID) | ORAL | 0 refills | Status: DC | PRN
  Filled 2022-05-15: qty 30, 8d supply, fill #0

## 2022-05-15 MED ORDER — IBUPROFEN 400 MG PO TABS
600.0000 mg | ORAL_TABLET | Freq: Once | ORAL | Status: AC
  Administered 2022-05-15: 600 mg via ORAL
  Filled 2022-05-15: qty 2

## 2022-05-15 MED ORDER — OSELTAMIVIR PHOSPHATE 75 MG PO CAPS
75.0000 mg | ORAL_CAPSULE | Freq: Two times a day (BID) | ORAL | 0 refills | Status: DC
  Filled 2022-05-15: qty 10, 5d supply, fill #0

## 2022-05-15 NOTE — ED Triage Notes (Signed)
Pt arrives c/o COVID vs flu. Pt states that her whole house has been sick, and she had been feeling better until this morning. Pt states that she has been dizzy and has had a few near syncope events. Pt has had body aches, chills, sore throat, non productive cough  Pt has been trying to alternate meds- today, she has taken:  Ibuprofen 0500 [place]icks 1000 Tylenol 1300

## 2022-05-15 NOTE — ED Provider Notes (Signed)
[facility] EMERGENCY DEPARTMENT Provider Note   CSN: [id number] Arrival date & time: 05/15/22  1441     History  Chief Complaint  Patient presents with   Near Syncope    Tanya Sanders is a 37 y.o. female.  Pt is a 36 yo female presenting for body aches, weakness, dizziness, fever of 103F prior to arrival, chills, sore throat, and nonproductive cough x several days. Admits to sick contacts at home. Tested negative for Covid with at-home test. Took Motrin at 5AM this morning and Tylenol PTA. Temperature on arrival to ED 100F with HR of 121.   The history is provided by the patient. No language interpreter was used.  Near Syncope Pertinent negatives include no chest pain, no abdominal pain and no shortness of breath.       Home Medications Prior to Admission medications   Medication Sig Start Date End Date Taking? Authorizing Provider  azithromycin (ZITHROMAX) 250 MG tablet Take 1 tablet (250 mg total) by mouth daily for 4 days. Take first 2 tablets together, then 1 every day until finished. 05/15/22 21/22/48 Yes Campbell Stall P, DO  ibuprofen (ADVIL) 600 MG tablet Take 1 tablet (600 mg total) by mouth every 6 (six) hours as needed for fever, headache or mild pain. 25/00/37  Yes Campbell Stall P, DO  oseltamivir (TAMIFLU) 75 MG capsule Take 1 capsule (75 mg total) by mouth every 12 (twelve) hours. 04/88/89  Yes Lianne Cure, DO  Semaglutide-Weight Management 2.4 MG/0.75ML SOAJ Inject 2.4 mg into the skin once a week. 04/06/22   Coral Spikes, DO      Allergies    Phentermine, Bactrim [sulfamethoxazole-trimethoprim], Penicillins, and Sulfa antibiotics    Review of Systems   Review of Systems  Constitutional:  Positive for chills and fever.  HENT:  Positive for congestion and sore throat. Negative for ear pain.   Eyes:  Negative for pain and visual disturbance.  Respiratory:  Positive for cough. Negative for shortness of breath.   Cardiovascular:  Positive for  near-syncope. Negative for chest pain and palpitations.  Gastrointestinal:  Negative for abdominal pain and vomiting.  Genitourinary:  Negative for dysuria and hematuria.  Musculoskeletal:  Negative for arthralgias and back pain.  Skin:  Negative for color change and rash.  Neurological:  Positive for weakness and light-headedness. Negative for seizures and syncope.  All other systems reviewed and are negative.   Physical Exam Updated Vital Signs BP 103/69   Pulse (!) 108   Temp 99.1 F (37.3 C) (Oral)   Resp (!) 23   Ht '5\' 6"'$  (1.676 m)   Wt 78.3 kg   LMP 04/27/2022   SpO2 99%   BMI 27.86 kg/m  Physical Exam Vitals and nursing note reviewed.  Constitutional:      General: She is not in acute distress.    Appearance: She is well-developed.  HENT:     Head: Normocephalic and atraumatic.  Eyes:     Conjunctiva/sclera: Conjunctivae normal.  Cardiovascular:     Rate and Rhythm: Normal rate and regular rhythm.     Heart sounds: No murmur heard. Pulmonary:     Effort: Pulmonary effort is normal. No respiratory distress.     Breath sounds: Normal breath sounds.  Abdominal:     Palpations: Abdomen is soft.     Tenderness: There is no abdominal tenderness.  Musculoskeletal:        General: No swelling.     Cervical back: Neck supple.  Skin:  General: Skin is warm and dry.     Capillary Refill: Capillary refill takes less than 2 seconds.  Neurological:     Mental Status: She is alert.  Psychiatric:        Mood and Affect: Mood normal.     ED Results / Procedures / Treatments   Labs (all labs ordered are listed, but only abnormal results are displayed) Labs Reviewed  RESP PANEL BY RT-PCR (RSV, FLU A&B, COVID)  RVPGX2 - Abnormal; Notable for the following components:      Result Value   Influenza A by PCR POSITIVE (*)    All other components within normal limits  BASIC METABOLIC PANEL - Abnormal; Notable for the following components:   Sodium 131 (*)    Glucose,  Bld 148 (*)    Calcium 8.1 (*)    All other components within normal limits  CBC - Abnormal; Notable for the following components:   HCT 35.5 (*)    All other components within normal limits  URINALYSIS, ROUTINE W REFLEX MICROSCOPIC - Abnormal; Notable for the following components:   APPearance HAZY (*)    Ketones, ur 80 (*)    Protein, ur 30 (*)    All other components within normal limits  CBG MONITORING, ED - Abnormal; Notable for the following components:   Glucose-Capillary 137 (*)    All other components within normal limits  POC URINE PREG, ED - Normal    EKG EKG Interpretation  Date/Time:  Saturday May 15 2022 14:59:20 EST Ventricular Rate:  124 PR Interval:  121 QRS Duration: 92 QT Interval:  275 QTC Calculation: 395 R Axis:   67 Text Interpretation: Sinus tachycardia Nonspecific repol abnormality, diffuse leads Confirmed by Campbell Stall ([id number]) on 69/48/5462 11:36:20 PM  Radiology DG Chest Portable 1 View  Result Date: 05/15/2022 CLINICAL DATA:  Cough and fever EXAM: PORTABLE CHEST 1 VIEW COMPARISON:  None Available. FINDINGS: Low volume film. Right lung clear. Patchy airspace disease is seen in the left base consistent with pneumonia. The cardiopericardial silhouette is within normal limits for size. The visualized bony structures of the thorax are unremarkable. Telemetry leads overlie the chest. IMPRESSION: Patchy airspace disease at the left base consistent with pneumonia. Electronically Signed   By: Misty Stanley M.D.   On: 05/15/2022 16:09    Procedures Procedures    Medications Ordered in ED Medications  ibuprofen (ADVIL) tablet 600 mg (600 mg Oral Given 05/15/22 1540)  sodium chloride 0.9 % bolus 1,000 mL (0 mLs Intravenous Stopped 05/15/22 1627)  sodium chloride 0.9 % bolus 1,000 mL (0 mLs Intravenous Stopped 05/15/22 1755)  azithromycin (ZITHROMAX) tablet 500 mg (500 mg Oral Given 05/15/22 1832)  acetaminophen (TYLENOL) tablet 650 mg (650 mg Oral  Given 05/15/22 1832)    ED Course/ Medical Decision Making/ A&P                           Medical Decision Making Amount and/or Complexity of Data Reviewed Labs: ordered. Radiology: ordered.  Risk OTC drugs. Prescription drug management.   11:38 PM 36 yo female presenting for body aches, weakness, dizziness, fever of 103F prior to arrival, chills, sore throat, and nonproductive cough x several days. Pt is Aox3, no acute distress, febrile with at temp of 100F after taking tylenol PTA and a HR of 121. On exam patient is non-toxic appearing. No pertient exam findings outside of sinus tachycardia. No rashes or neck rigidity/stiffness. Meningitis considered and  thought to be less likely at this time. Covid/Flu/RSV pending. UA pending. IV fluids and Motrin administered.   Patient positive for influenza.  Patient given a total of 2 L IV fluids, Motrin '600mg'$ , and Tylenol 650 mg.  She also did not rates a focal pneumonia in the left lower lung base.  Will treat for serial pneumonia due to its focality.  Patient offered admission and further workup for sepsis for continued tachycardia despite 2 L IV fluid, Motrin, and Tylenol however declining at this time.  Her and her husband agreed to prompt return to emergency department if symptoms worsen in any way or if she loses consciousness or experiences any syncopal events.  Patient is recommended for rest, increase hydration, vitamin C, Tamiflu, and antibiotics possible overlying bacterial pneumonia.  Patient in no distress and overall condition improved here in the ED. Detailed discussions were had with the patient regarding current findings, and need for close f/u with PCP or on call doctor. The patient has been instructed to return immediately if the symptoms worsen in any way for re-evaluation. Patient verbalized understanding and is in agreement with current care plan. All questions answered prior to discharge.         Final Clinical  Impression(s) / ED Diagnoses Final diagnoses:  Pneumonia of left lower lobe due to infectious organism  Flu    Rx / DC Orders ED Discharge Orders          Ordered    oseltamivir (TAMIFLU) 75 MG capsule  Every 12 hours        05/15/22 1808    ibuprofen (ADVIL) 600 MG tablet  Every 6 hours PRN        05/15/22 1808    azithromycin (ZITHROMAX) 250 MG tablet  Daily        05/15/22 1832              Lianne Cure, DO 09/98/33 2338

## 2022-05-15 NOTE — Discharge Instructions (Signed)
Today you tested positive for Influenza. Your chest xray also shows signs of a focal left lower lobe pneumonia. This could be from the influenza virus but due to the focal nature of it I am concerned it might be bacterial in nature. I have sent a prescription for azithromycin antibiotic to your pharmacy just incase. Please rest, increase hydration, vitamin C intake, and take Tamiflu (antiviral for flu).   Return to ED for any worsening symptoms.

## 2022-05-16 ENCOUNTER — Telehealth (HOSPITAL_COMMUNITY): Payer: Self-pay | Admitting: Emergency Medicine

## 2022-05-16 MED ORDER — AZITHROMYCIN 250 MG PO TABS: 250.0000 mg | ORAL_TABLET | Freq: Every day | ORAL | 0 refills | Status: DC

## 2022-05-16 MED ORDER — IBUPROFEN 600 MG PO TABS: 600.0000 mg | ORAL_TABLET | Freq: Four times a day (QID) | ORAL | 0 refills | Status: DC | PRN

## 2022-05-16 MED ORDER — OSELTAMIVIR PHOSPHATE 75 MG PO CAPS: 75.0000 mg | ORAL_CAPSULE | Freq: Two times a day (BID) | ORAL | 0 refills | Status: DC

## 2022-05-16 NOTE — Telephone Encounter (Signed)
Original prescription sent to wrong pharmacy.  New prescriptions written and sent to patient's pharmacy of choice.

## 2022-05-16 NOTE — Telephone Encounter (Signed)
Original prescription sent to wrong pharmacy.  Prescriptions resent to patient's pharmacy of choice

## 2022-05-18 ENCOUNTER — Other Ambulatory Visit (HOSPITAL_COMMUNITY): Payer: Self-pay

## 2022-05-19 ENCOUNTER — Ambulatory Visit: Payer: No Typology Code available for payment source | Admitting: Family Medicine

## 2022-05-19 ENCOUNTER — Ambulatory Visit: Payer: No Typology Code available for payment source

## 2022-05-28 ENCOUNTER — Other Ambulatory Visit (HOSPITAL_COMMUNITY): Payer: Self-pay

## 2022-06-21 ENCOUNTER — Other Ambulatory Visit: Payer: Self-pay

## 2022-06-21 ENCOUNTER — Ambulatory Visit (INDEPENDENT_AMBULATORY_CARE_PROVIDER_SITE_OTHER): Payer: 59 | Admitting: Family Medicine

## 2022-06-21 ENCOUNTER — Other Ambulatory Visit (HOSPITAL_COMMUNITY): Payer: Self-pay

## 2022-06-21 VITALS — BP 104/60 | HR 72 | Temp 97.5°F | Wt 165.0 lb

## 2022-06-21 DIAGNOSIS — E663 Overweight: Secondary | ICD-10-CM | POA: Diagnosis not present

## 2022-06-21 MED ORDER — SEMAGLUTIDE-WEIGHT MANAGEMENT 2.4 MG/0.75ML ~~LOC~~ SOAJ
2.4000 mg | SUBCUTANEOUS | 12 refills | Status: DC
  Filled 2022-06-21: qty 3, 28d supply, fill #0
  Filled 2022-07-21: qty 3, 28d supply, fill #1
  Filled 2022-08-05: qty 3, 28d supply, fill #2

## 2022-06-21 NOTE — Assessment & Plan Note (Signed)
Patient has had significant weight loss.  Previous BMI as high as 34.  She has responded very well to [facility].  Will continue.  Refilled today.

## 2022-06-21 NOTE — Patient Instructions (Signed)
Follow up annually.  Message with concerns or issues.  Take care  Dr. Lacinda Axon

## 2022-06-21 NOTE — Progress Notes (Signed)
Subjective:  Patient ID: Tanya Sanders, female    DOB: August 01, 1985  Age: 37 y.o. MRN: [id number]  CC: Chief Complaint  Patient presents with   Obesity    Medication refills no concerns at this time    HPI:  37 year old female presents for follow-up.  Patient continues to do well on Wegovy.  She has lost a significant amount of weight.  BMI now down to 26.  She is doing well on Wegovy with no reported side effects.  She would like to continue.  Needs refill.  Patient Active Problem List   Diagnosis Date Noted   Overweight (BMI 25.0-29.9) 06/21/2022   Hyperlipidemia 06/17/2021   Polycystic ovarian syndrome 08/12/2014    Social Hx   Social History   Socioeconomic History   Marital status: Married    Spouse name: Not on file   Number of children: Not on file   Years of education: Not on file   Highest education level: Not on file  Occupational History   Occupation: Therapist, sports    Employer: [employer]  Tobacco Use   Smoking status: Former    Packs/day: 1.00    Years: 11.00    Total pack years: 11.00    Types: Cigarettes    Start date: 10/26/2001    Quit date: 08/22/2012    Years since quitting: 9.8   Smokeless tobacco: Never  Vaping Use   Vaping Use: Never used  Substance and Sexual Activity   Alcohol use: Not Currently    Comment: a couple a week   Drug use: No   Sexual activity: Yes    Partners: Male    Birth control/protection: Other-see comments    Comment: 1st intercourse- 69, partners- more than 10, husband vasectomy  Other Topics Concern   Not on file  Social History Narrative   Not on file   Social Determinants of Health   Financial Resource Strain: Not on file  Food Insecurity: Not on file  Transportation Needs: Not on file  Physical Activity: Not on file  Stress: Not on file  Social Connections: Not on file    Review of Systems  Constitutional: Negative.   Gastrointestinal: Negative.    Objective:  BP 104/60   Pulse 72   Temp (!) 97.5  F (36.4 C)   Wt 165 lb (74.8 kg)   SpO2 100%   BMI 26.63 kg/m      06/21/2022    8:25 AM 05/15/2022    5:30 PM 05/15/2022    3:45 PM  BP/Weight  Systolic BP 696 295   Diastolic BP 60 69   Wt. (Lbs) 165  172.6  BMI 26.63 kg/m2  27.86 kg/m2    Physical Exam Vitals and nursing note reviewed.  Constitutional:      General: She is not in acute distress.    Appearance: Normal appearance.  HENT:     Head: Normocephalic and atraumatic.  Cardiovascular:     Rate and Rhythm: Normal rate and regular rhythm.  Pulmonary:     Effort: Pulmonary effort is normal.     Breath sounds: No wheezing, rhonchi or rales.  Neurological:     Mental Status: She is alert.  Psychiatric:        Mood and Affect: Mood normal.        Behavior: Behavior normal.     Lab Results  Component Value Date   WBC 8.8 05/15/2022   HGB 12.0 05/15/2022   HCT 35.5 (L) 05/15/2022  PLT 177 05/15/2022   GLUCOSE 148 (H) 05/15/2022   CHOL 176 11/11/2017   TRIG 63 11/11/2017   HDL 56 11/11/2017   LDLCALC 107 (H) 11/11/2017   ALT 53 (H) 11/11/2017   AST 39 11/11/2017   NA 131 (L) 05/15/2022   K 3.6 05/15/2022   CL 101 05/15/2022   CREATININE 0.72 05/15/2022   BUN 7 05/15/2022   CO2 23 05/15/2022   TSH 1.740 12/29/2016   HGBA1C 5.3 01/15/2016     Assessment & Plan:   Problem List Items Addressed This Visit       Other   Overweight (BMI 25.0-29.9) - Primary    Patient has had significant weight loss.  Previous BMI as high as 34.  She has responded very well to [facility].  Will continue.  Refilled today.       Meds ordered this encounter  Medications   Semaglutide-Weight Management 2.4 MG/0.75ML SOAJ    Sig: Inject 2.4 mg into the skin once a week.    Dispense:  3 mL    Refill:  12    Follow-up:  Return in about 1 year (around 06/22/2023).  [place]

## 2022-07-21 ENCOUNTER — Other Ambulatory Visit: Payer: Self-pay

## 2022-07-22 ENCOUNTER — Other Ambulatory Visit: Payer: Self-pay

## 2022-08-03 ENCOUNTER — Encounter: Payer: Self-pay | Admitting: Family Medicine

## 2022-08-05 ENCOUNTER — Other Ambulatory Visit (HOSPITAL_COMMUNITY): Payer: Self-pay

## 2022-08-10 ENCOUNTER — Other Ambulatory Visit (HOSPITAL_COMMUNITY): Payer: Self-pay

## 2022-08-10 ENCOUNTER — Other Ambulatory Visit: Payer: Self-pay | Admitting: Family Medicine

## 2022-08-10 MED ORDER — SEMAGLUTIDE-WEIGHT MANAGEMENT 2.4 MG/0.75ML ~~LOC~~ SOAJ
2.4000 mg | SUBCUTANEOUS | 0 refills | Status: DC
  Filled 2022-08-10 – 2022-08-13 (×2): qty 3, 28d supply, fill #0
  Filled 2022-09-11: qty 3, 28d supply, fill #1

## 2022-08-13 ENCOUNTER — Other Ambulatory Visit: Payer: Self-pay

## 2022-08-13 ENCOUNTER — Other Ambulatory Visit (HOSPITAL_COMMUNITY): Payer: Self-pay

## 2022-09-11 ENCOUNTER — Other Ambulatory Visit (HOSPITAL_COMMUNITY): Payer: Self-pay

## 2022-09-13 ENCOUNTER — Other Ambulatory Visit: Payer: Self-pay

## 2022-09-22 HISTORY — PX: BREAST ENHANCEMENT SURGERY: SHX7

## 2022-10-04 ENCOUNTER — Other Ambulatory Visit (HOSPITAL_COMMUNITY): Payer: Self-pay

## 2022-10-04 MED ORDER — OXYCODONE-ACETAMINOPHEN 5-325 MG PO TABS
1.0000 | ORAL_TABLET | ORAL | 0 refills | Status: DC | PRN
  Filled 2022-10-04: qty 30, 3d supply, fill #0

## 2022-10-04 MED ORDER — DIAZEPAM 5 MG PO TABS
2.5000 mg | ORAL_TABLET | Freq: Two times a day (BID) | ORAL | 0 refills | Status: DC
  Filled 2022-10-04: qty 14, 7d supply, fill #0

## 2022-10-04 MED ORDER — CEFADROXIL 500 MG PO CAPS
500.0000 mg | ORAL_CAPSULE | Freq: Two times a day (BID) | ORAL | 0 refills | Status: DC
  Filled 2022-10-04: qty 14, 7d supply, fill #0

## 2022-10-19 ENCOUNTER — Encounter: Payer: Self-pay | Admitting: Family Medicine

## 2022-11-05 ENCOUNTER — Encounter: Payer: Self-pay | Admitting: Nurse Practitioner

## 2022-11-05 ENCOUNTER — Ambulatory Visit: Payer: Managed Care, Other (non HMO) | Admitting: Nurse Practitioner

## 2022-11-05 VITALS — BP 110/71 | HR 86 | Temp 98.6°F | Ht 66.0 in | Wt 176.0 lb

## 2022-11-05 DIAGNOSIS — E663 Overweight: Secondary | ICD-10-CM

## 2022-11-05 NOTE — Patient Instructions (Signed)
Saxenda Wegovy Zepbound 

## 2022-11-05 NOTE — Progress Notes (Signed)
   Subjective:    Patient ID: Tanya Sanders, female    DOB: [birth year], 37 y.o.   MRN: [id number]  HPI Patient has weight gain concerns and would like to retry a GLP-1 weight loss medication  Has been off [facility] for about a month due to surgery for breast augmentation.  Does have several injections at home.  Her insurance stopped paying for weight loss injections, is now under new insurance and would like to see if they will cover it.  Has maintained most of her weight loss, lowest weight was 160 pounds.  The injections helped her manage her cravings especially for sweets.  Eats mostly healthy diet with limited portion sizes.  Walks 30 to 45 minutes 3 to 4 days/week.  Working out with [employer] as well.  Denies any nausea vomiting abdominal pain or constant.  Mild cramping the first day of her injection but this resolves quickly.   Review of Systems  Constitutional:  Negative for fatigue.  Respiratory:  Negative for cough, chest tightness and shortness of breath.   Cardiovascular:  Negative for chest pain and leg swelling.  Gastrointestinal:  Negative for abdominal pain, constipation, diarrhea, nausea and vomiting.       Objective:   Physical Exam NAD.  Alert, oriented.  Lungs clear.  Heart regular rate rhythm.  Soft nondistended nontender. Today's Vitals   11/05/22 0922  BP: 110/71  Pulse: 86  Temp: 98.6 F (37 C)  SpO2: 99%  Weight: 176 lb (79.8 kg)  Height: 5\' 6"  (1.676 m)   Body mass index is 28.41 kg/m.        Assessment & Plan:   Problem List Items Addressed This Visit       Other   Overweight (BMI 25.0-29.9) - Primary   Information given on weight loss injectables available on the market.  Patient will check to see if any of these are covered by her insurance.  Cautioned patient about risks taking compounded medications for GLP-1. Continue healthy lifestyle habits including healthy diet and regular exercise. Gets regular preventive health physicals with  gynecology.

## 2022-11-08 ENCOUNTER — Encounter: Payer: Self-pay | Admitting: Nurse Practitioner

## 2023-01-19 ENCOUNTER — Ambulatory Visit: Payer: No Typology Code available for payment source | Admitting: Obstetrics & Gynecology

## 2023-01-21 ENCOUNTER — Ambulatory Visit: Payer: Managed Care, Other (non HMO) | Admitting: *Deleted

## 2023-01-21 DIAGNOSIS — Z23 Encounter for immunization: Secondary | ICD-10-CM

## 2023-01-28 ENCOUNTER — Encounter: Payer: Self-pay | Admitting: Family Medicine

## 2023-02-01 ENCOUNTER — Other Ambulatory Visit: Payer: Self-pay

## 2023-02-02 ENCOUNTER — Other Ambulatory Visit: Payer: Self-pay

## 2023-02-02 DIAGNOSIS — Z111 Encounter for screening for respiratory tuberculosis: Secondary | ICD-10-CM

## 2023-02-09 LAB — QUANTIFERON-TB GOLD PLUS
QuantiFERON Mitogen Value: >10 [IU]/mL
QuantiFERON Nil Value: 0.02 [IU]/mL
QuantiFERON TB1 Ag Value: 0.01 [IU]/mL
QuantiFERON TB2 Ag Value: 0.02 [IU]/mL
QuantiFERON-TB Gold Plus: NEGATIVE

## 2023-02-22 ENCOUNTER — Encounter: Payer: Self-pay | Admitting: Obstetrics and Gynecology

## 2023-02-22 ENCOUNTER — Other Ambulatory Visit (HOSPITAL_COMMUNITY)
Admission: RE | Admit: 2023-02-22 | Discharge: 2023-02-22 | Disposition: A | Discharge status: Home / Self Care | Payer: Managed Care, Other (non HMO) | Source: Ambulatory Visit | Attending: Obstetrics and Gynecology | Admitting: Obstetrics and Gynecology

## 2023-02-22 ENCOUNTER — Ambulatory Visit (INDEPENDENT_AMBULATORY_CARE_PROVIDER_SITE_OTHER): Payer: Managed Care, Other (non HMO) | Admitting: Obstetrics and Gynecology

## 2023-02-22 VITALS — BP 112/80 | HR 68 | Ht 64.75 in | Wt 182.0 lb

## 2023-02-22 DIAGNOSIS — N920 Excessive and frequent menstruation with regular cycle: Secondary | ICD-10-CM | POA: Diagnosis not present

## 2023-02-22 DIAGNOSIS — Z01419 Encounter for gynecological examination (general) (routine) without abnormal findings: Secondary | ICD-10-CM

## 2023-02-22 DIAGNOSIS — N946 Dysmenorrhea, unspecified: Secondary | ICD-10-CM | POA: Diagnosis not present

## 2023-02-22 DIAGNOSIS — Z8742 Personal history of other diseases of the female genital tract: Secondary | ICD-10-CM

## 2023-02-22 HISTORY — DX: Personal history of other diseases of the female genital tract: Z87.42

## 2023-02-22 NOTE — Progress Notes (Signed)
37 y.o. y.o. female here for annual exam.    G2P2L2 Married.  Vasectomy.  RP: Menorrhagia with regular cycles for Pelvic US  HPI: Menstrual periods every month, still with heavy flow x 7 days.  Continues to have severe dysmenorrhea.  No breakthrough bleeding.  No pelvic pain.  No pain with intercourse.  Patient's last menstrual period was 02/05/2023 (exact date). Period Duration (Days): 5-7 Period Pattern: Regular Menstrual Flow: Heavy Menstrual Control: Maxi pad Dysmenorrhea: (!) Severe Dysmenorrhea Symptoms: Cramping, Nausea, Diarrhea Bothered by periods.  Does not want any hormones.  All her family members have had hysterectomies because of their heavy periods.     Height 5' 4.75" (1.645 m), weight 182 lb (82.6 kg), last menstrual period 02/05/2023.     Component Value Date/Time   DIAGPAP  01/05/2021 1440    - Negative for intraepithelial lesion or malignancy (NILM)   ADEQPAP  01/05/2021 1440    Satisfactory for evaluation; transformation zone component PRESENT.   Remote abnormal reports she did colposcopy and normal after in highschool  GYN HISTORY:    Component Value Date/Time   DIAGPAP  01/05/2021 1440    - Negative for intraepithelial lesion or malignancy (NILM)   ADEQPAP  01/05/2021 1440    Satisfactory for evaluation; transformation zone component PRESENT.    OB History  Gravida Para Term Preterm AB Living  2 2 1     2   SAB IAB Ectopic Multiple Live Births          2    # Outcome Date GA Lbr Len/2nd Weight Sex Type Anes PTL Lv  2 Para 01/31/14   7 lb 15 oz (3.6 kg) M CS-LTranv Spinal  LIV  1 Term             Past Medical History:  Diagnosis Date   Abscess of tonsil     Past Surgical History:  Procedure Laterality Date   CESAREAN SECTION     CESAREAN SECTION N/A 01/31/2014   Procedure: Repeat CESAREAN SECTION;  Surgeon: Genia Del, MD;  Location: [facility];  Service: Obstetrics;  Laterality: N/A;  EDD: 02/06/14   CHOLECYSTECTOMY N/A  09/22/2012   Procedure: LAPAROSCOPIC CHOLECYSTECTOMY;  Surgeon: Marlane Hatcher, MD;  Location: [facility];  Service: General;  Laterality: N/A;   ERCP N/A 10/06/2012   Procedure: ENDOSCOPIC RETROGRADE CHOLANGIOPANCREATOGRAPHY (ERCP);  Surgeon: Corbin Ade, MD;  Location: [facility];  Service: Gastroenterology;  Laterality: N/A;   EYE SURGERY  09/19/2020   LASIK Bilateral    08/2020   REMOVAL OF STONES N/A 10/06/2012   Procedure: REMOVAL OF STONES;  Surgeon: Corbin Ade, MD;  Location: [facility];  Service: Gastroenterology;  Laterality: N/A;   SPHINCTEROTOMY N/A 10/06/2012   Procedure: SPHINCTEROTOMY;  Surgeon: Corbin Ade, MD;  Location: [facility];  Service: Gastroenterology;  Laterality: N/A;   TONSILLECTOMY N/A 06/03/2016   Procedure: TONSILLECTOMY;  Surgeon: Serena Colonel, MD;  Location: [facility];  Service: ENT;  Laterality: N/A;    Current Outpatient Medications on File Prior to Visit  Medication Sig Dispense Refill   cefadroxil (DURICEF) 500 MG capsule Take 1 capsule (500 mg total) by mouth 2 (two) times daily for 7 days. 14 capsule 0   diazepam (VALIUM) 5 MG tablet Take 0.5-1 tablets (2.5-5 mg total) by mouth 2 (two) times daily. 14 tablet 0   oxyCODONE-acetaminophen (PERCOCET/ROXICET) 5-325 MG tablet Take 1-2 tablets by mouth every 4-6 hours as needed for pain. 30 tablet 0  Semaglutide-Weight Management 2.4 MG/0.75ML SOAJ Inject 2.4 mg into the skin once a week. 6 mL 0   No current facility-administered medications on file prior to visit.    Social History   Socioeconomic History   Marital status: Married    Spouse name: Not on file   Number of children: Not on file   Years of education: Not on file   Highest education level: Not on file  Occupational History   Occupation: Charity fundraiser    Employer: [employer]  Tobacco Use   Smoking status: Former    Current packs/day: 0.00    Average packs/day: 1 pack/day for 11.0 years (11.0 ttl pk-yrs)    Types: Cigarettes    Start date:  10/26/2001    Quit date: 08/22/2012    Years since quitting: 10.5   Smokeless tobacco: Never  Vaping Use   Vaping status: Never Used  Substance and Sexual Activity   Alcohol use: Yes    Comment: 1 a week   Drug use: No   Sexual activity: Yes    Partners: Male    Birth control/protection: Other-see comments    Comment: 1st intercourse- 16, partners- more than 5, husband vasectomy  Other Topics Concern   Not on file  Social History Narrative   Not on file   Social Determinants of Health   Financial Resource Strain: Not on file  Food Insecurity: Not on file  Transportation Needs: Not on file  Physical Activity: Not on file  Stress: Not on file  Social Connections: Not on file  Intimate Partner Violence: Not on file    Family History  Problem Relation Age of Onset   Hypertension Father    COPD Father    Lung cancer Father      Allergies  Allergen Reactions   Phentermine Itching   Bactrim [Sulfamethoxazole-Trimethoprim] Rash   Penicillins Rash   Sulfa Antibiotics Rash      Patient's last menstrual period was Patient's last menstrual period was 02/05/2023 (exact date)..          Sexually active: yes, married     Review of Systems Alls systems reviewed and are negative.     Physical Exam Vitals and nursing note reviewed. Exam conducted with a chaperone present.  Constitutional:      Appearance: Normal appearance.  HENT:     Head: Normocephalic.  Neck:     Thyroid: No thyroid mass, thyromegaly or thyroid tenderness.  Cardiovascular:     Rate and Rhythm: Normal rate and regular rhythm.  Pulmonary:     Effort: Pulmonary effort is normal.     Breath sounds: Normal breath sounds.  Chest:  Breasts:    Right: Normal. No swelling, bleeding, mass, nipple discharge or tenderness.     Left: Normal. No swelling, bleeding, mass, nipple discharge or tenderness.     Comments: B/l breast augmentation Abdominal:     Palpations: Abdomen is soft.     Tenderness: There  is no guarding or rebound.  Genitourinary:    General: Normal vulva.     Labia:        Right: No rash, tenderness or lesion.        Left: No rash, tenderness or lesion.      Urethra: No prolapse or urethral pain.     Vagina: Normal.     Cervix: Normal.     Adnexa: Right adnexa normal and left adnexa normal.     Comments: Boggy 8cm uterus  Musculoskeletal:  General: Normal range of motion.     Cervical back: Full passive range of motion without pain and normal range of motion.     Right lower leg: No edema.     Left lower leg: No edema.  Skin:    General: Skin is warm.  Neurological:     Mental Status: She is alert.       A:         Well Woman GYN exam, adenomyosis, menorrhagia, dysmenorrhea                             P:        Pap smear collected             Encouraged annual mammogram screening beginning at age 66                     Labs -see orders today             All options for cycle controlled reviewed with patient. Earley Favor

## 2023-02-23 LAB — COMPREHENSIVE METABOLIC PANEL
AG Ratio: 1.4 (calc) (ref 1.0–2.5)
ALT: 21 U/L (ref 6–29)
AST: 20 U/L (ref 10–30)
Albumin: 3.9 g/dL (ref 3.6–5.1)
Alkaline phosphatase (APISO): 36 U/L (ref 31–125)
BUN: 10 mg/dL (ref 7–25)
CO2: 25 mmol/L (ref 20–32)
Calcium: 9.1 mg/dL (ref 8.6–10.2)
Chloride: 104 mmol/L (ref 98–110)
Creat: 0.88 mg/dL (ref 0.50–0.97)
Globulin: 2.7 g/dL (ref 1.9–3.7)
Glucose, Bld: 92 mg/dL (ref 65–99)
Potassium: 3.9 mmol/L (ref 3.5–5.3)
Sodium: 139 mmol/L (ref 135–146)
Total Bilirubin: 0.3 mg/dL (ref 0.2–1.2)
Total Protein: 6.6 g/dL (ref 6.1–8.1)

## 2023-02-23 LAB — CBC
HCT: 37.5 % (ref 35.0–45.0)
Hemoglobin: 12 g/dL (ref 11.7–15.5)
MCH: 27.8 pg (ref 27.0–33.0)
MCHC: 32 g/dL (ref 32.0–36.0)
MCV: 86.8 fL (ref 80.0–100.0)
MPV: 10.9 fL (ref 7.5–12.5)
Platelets: 331 10*3/uL (ref 140–400)
RBC: 4.32 10*6/uL (ref 3.80–5.10)
RDW: 12.4 % (ref 11.0–15.0)
WBC: 8.1 10*3/uL (ref 3.8–10.8)

## 2023-02-23 LAB — SURESWAB® ADVANCED VAGINITIS PLUS,TMA
C. trachomatis RNA, TMA: NOT DETECTED
CANDIDA SPECIES: NOT DETECTED
Candida glabrata: NOT DETECTED
N. gonorrhoeae RNA, TMA: NOT DETECTED
SURESWAB(R) ADV BACTERIAL VAGINOSIS(BV),TMA: NEGATIVE
TRICHOMONAS VAGINALIS (TV),TMA: NOT DETECTED

## 2023-02-23 LAB — TSH: TSH: 1.85 m[IU]/L

## 2023-02-28 LAB — CYTOLOGY - PAP
Comment: NEGATIVE
High risk HPV: NEGATIVE

## 2023-03-01 ENCOUNTER — Encounter: Payer: Self-pay | Admitting: Obstetrics and Gynecology

## 2023-03-01 DIAGNOSIS — R87612 Low grade squamous intraepithelial lesion on cytologic smear of cervix (LGSIL): Secondary | ICD-10-CM

## 2023-03-25 ENCOUNTER — Encounter: Payer: Managed Care, Other (non HMO) | Admitting: Obstetrics and Gynecology

## 2023-03-29 ENCOUNTER — Encounter: Payer: Self-pay | Admitting: Obstetrics and Gynecology

## 2023-03-29 ENCOUNTER — Ambulatory Visit (INDEPENDENT_AMBULATORY_CARE_PROVIDER_SITE_OTHER): Payer: Managed Care, Other (non HMO) | Admitting: Obstetrics and Gynecology

## 2023-03-29 VITALS — BP 118/70 | HR 105

## 2023-03-29 DIAGNOSIS — R87612 Low grade squamous intraepithelial lesion on cytologic smear of cervix (LGSIL): Secondary | ICD-10-CM | POA: Diagnosis not present

## 2023-03-29 NOTE — Progress Notes (Unsigned)
Colposcopy Procedure Note MERTHA CLYATT 03/29/2023  Indications:  Procedure Details  The risks and benefits of the procedure and Written informed consent obtained.  Speculum placed in vagina and excellent visualization of cervix achieved, cervix swabbed x 3 with acetic acid solution.     Component Value Date/Time   DIAGPAP - Low grade squamous intraepithelial lesion (LSIL) (A) 02/22/2023 1401   DIAGPAP  01/05/2021 1440    - Negative for intraepithelial lesion or malignancy (NILM)   HPVHIGH Negative 02/22/2023 1401   ADEQPAP  02/22/2023 1401    Satisfactory for evaluation; transformation zone component PRESENT.   ADEQPAP  01/05/2021 1440    Satisfactory for evaluation; transformation zone component PRESENT.    High Risk HPV: Positive  Adequacy:  Satisfactory for evaluation, transformation zone component PRESENT  Diagnosis:  Atypical squamous cells of undetermined significance (ASC-US)  Impression:***  Satisfactory( ECC zone seen): ***  Findings:  Cervix colposcopy biopsy taken: *** O'clock for  *** O'clock for *** O'clock for  ECC performed with cytobrush  Hemostasis obtained with application of Monsel's Solution    Complications:    Patient tolerated the procedure well  Plan:  To notify patient in epic and phone call of results and plan of care Discussed she must have two consecutive normal pap smears Q43months before returning to annual pap smears Counseled on the importance of gardesil vaccination   Earley Favor

## 2023-03-30 ENCOUNTER — Other Ambulatory Visit (HOSPITAL_COMMUNITY)
Admission: RE | Admit: 2023-03-30 | Discharge: 2023-03-30 | Disposition: A | Discharge status: Home / Self Care | Payer: Managed Care, Other (non HMO) | Source: Ambulatory Visit | Attending: Obstetrics and Gynecology | Admitting: Obstetrics and Gynecology

## 2023-03-30 DIAGNOSIS — R87612 Low grade squamous intraepithelial lesion on cytologic smear of cervix (LGSIL): Secondary | ICD-10-CM | POA: Insufficient documentation

## 2023-04-01 LAB — SURGICAL PATHOLOGY

## 2023-07-25 ENCOUNTER — Ambulatory Visit
Admission: EM | Admit: 2023-07-25 | Discharge: 2023-07-25 | Disposition: A | Discharge status: Home / Self Care | Attending: Family Medicine | Admitting: Family Medicine

## 2023-07-25 DIAGNOSIS — S61215A Laceration without foreign body of left ring finger without damage to nail, initial encounter: Secondary | ICD-10-CM | POA: Diagnosis not present

## 2023-07-25 MED ORDER — MUPIROCIN 2 % EX OINT
1.0000 | TOPICAL_OINTMENT | Freq: Two times a day (BID) | CUTANEOUS | 0 refills | Status: DC
  Filled 2023-07-25: qty 22, 11d supply, fill #0

## 2023-07-25 MED ORDER — CHLORHEXIDINE GLUCONATE 4 % EX SOLN
Freq: Every day | CUTANEOUS | 0 refills | Status: DC | PRN
  Filled 2023-07-25: qty 236, 30d supply, fill #0

## 2023-07-25 NOTE — Discharge Instructions (Signed)
 Let the glue peel off when it is ready.  This is usually about 3 to 4 days.  Once the glue peels off, you may start cleaning the area once to twice daily with the Hibiclens solution and applying the mupirocin ointment.  Keep the area covered with a nonstick dressing until it fully heals.

## 2023-07-25 NOTE — ED Triage Notes (Signed)
 Pt reports laceration to the left hand pt states she was cooking when seh cut her finger tip on the ring finger.

## 2023-07-26 ENCOUNTER — Other Ambulatory Visit (HOSPITAL_COMMUNITY): Payer: Self-pay

## 2023-07-27 ENCOUNTER — Other Ambulatory Visit: Payer: Self-pay

## 2023-07-28 ENCOUNTER — Other Ambulatory Visit: Payer: Self-pay

## 2023-07-29 DIAGNOSIS — S61215A Laceration without foreign body of left ring finger without damage to nail, initial encounter: Secondary | ICD-10-CM | POA: Diagnosis not present

## 2023-07-29 NOTE — ED Provider Notes (Signed)
 [facility]    CSN: [id number] Arrival date & time: 07/25/23  1800      History   Chief Complaint No chief complaint on file.   HPI Tanya Sanders is a 38 y.o. female.   Patient presenting today with a laceration to the tip of the left ring finger that occurred while she was prepping dinner tonight.  She denies decreased range of motion, numbness, tingling, uncontrolled bleeding.  So far has been applying direct pressure with good control of the bleeding.  Tdap up-to-date, last administered in 2024.    Past Medical History:  Diagnosis Date   Abnormal Pap smear of cervix    lgsil   Abscess of tonsil    H/O breast implant    silicone 09/2022    Patient Active Problem List   Diagnosis Date Noted   Overweight (BMI 25.0-29.9) 06/21/2022   Hyperlipidemia 06/17/2021   Polycystic ovarian syndrome 08/12/2014    Past Surgical History:  Procedure Laterality Date   CESAREAN SECTION     CESAREAN SECTION N/A 01/31/2014   Procedure: Repeat CESAREAN SECTION;  Surgeon: Genia Del, MD;  Location: [facility];  Service: Obstetrics;  Laterality: N/A;  EDD: 02/06/14   CHOLECYSTECTOMY N/A 09/22/2012   Procedure: LAPAROSCOPIC CHOLECYSTECTOMY;  Surgeon: Marlane Hatcher, MD;  Location: [facility];  Service: General;  Laterality: N/A;   ERCP N/A 10/06/2012   Procedure: ENDOSCOPIC RETROGRADE CHOLANGIOPANCREATOGRAPHY (ERCP);  Surgeon: Corbin Ade, MD;  Location: [facility];  Service: Gastroenterology;  Laterality: N/A;   EYE SURGERY  09/19/2020   LASIK Bilateral    08/2020   REMOVAL OF STONES N/A 10/06/2012   Procedure: REMOVAL OF STONES;  Surgeon: Corbin Ade, MD;  Location: [facility];  Service: Gastroenterology;  Laterality: N/A;   SPHINCTEROTOMY N/A 10/06/2012   Procedure: SPHINCTEROTOMY;  Surgeon: Corbin Ade, MD;  Location: [facility];  Service: Gastroenterology;  Laterality: N/A;   TONSILLECTOMY N/A 06/03/2016   Procedure: TONSILLECTOMY;  Surgeon: Serena Colonel, MD;   Location: [facility] OR;  Service: ENT;  Laterality: N/A;    OB History     Gravida  2   Para  2   Term  1   Preterm      AB      Living  2      SAB      IAB      Ectopic      Multiple      Live Births  2            Home Medications    Prior to Admission medications   Medication Sig Start Date End Date Taking? Authorizing Provider  chlorhexidine (HIBICLENS) 4 % external liquid Apply topically daily as needed. 07/25/23  Yes Particia Nearing, PA-C  mupirocin ointment (BACTROBAN) 2 % Apply 1 Application topically 2 (two) times daily. 07/25/23  Yes Particia Nearing, PA-C  IBUPROFEN PO Take by mouth.    [provider]    Family History Family History  Problem Relation Age of Onset   Hypertension Father    COPD Father    Lung cancer Father     Social History Social History   Tobacco Use   Smoking status: Former    Current packs/day: 0.00    Average packs/day: 1 pack/day for 11.0 years (11.0 ttl pk-yrs)    Types: Cigarettes    Start date: 10/26/2001    Quit date: 08/22/2012    Years since quitting: 10.9   Smokeless  tobacco: Never  Vaping Use   Vaping status: Never Used  Substance Use Topics   Alcohol use: Yes    Comment: 1 a week   Drug use: No     Allergies   Phentermine, Bactrim [sulfamethoxazole-trimethoprim], Penicillins, and Sulfa antibiotics   Review of Systems Review of Systems PER HPI  Physical Exam Triage Vital Signs ED Triage Vitals  Encounter Vitals Group     BP 07/25/23 1805 138/89     Systolic BP Percentile --      Diastolic BP Percentile --      Pulse Rate 07/25/23 1805 72     Resp 07/25/23 1805 18     Temp 07/25/23 1805 97.8 F (36.6 C)     Temp Source 07/25/23 1805 Oral     SpO2 07/25/23 1805 98 %     Weight --      Height --      Head Circumference --      Peak Flow --      Pain Score 07/25/23 1806 0     Pain Loc --      Pain Education --      Exclude from Growth Chart --    No data  found.  Updated Vital Signs BP 138/89 (BP Location: Right Arm)   Pulse 72   Temp 97.8 F (36.6 C) (Oral)   Resp 18   LMP 07/24/2023 (Exact Date)   SpO2 98%   Visual Acuity Right Eye Distance:   Left Eye Distance:   Bilateral Distance:    Right Eye Near:   Left Eye Near:    Bilateral Near:     Physical Exam Vitals and nursing note reviewed.  Constitutional:      Appearance: Normal appearance. She is not ill-appearing.  HENT:     Head: Atraumatic.  Eyes:     Extraocular Movements: Extraocular movements intact.     Conjunctiva/sclera: Conjunctivae normal.  Cardiovascular:     Rate and Rhythm: Normal rate and regular rhythm.     Heart sounds: Normal heart sounds.  Pulmonary:     Effort: Pulmonary effort is normal.     Breath sounds: Normal breath sounds.  Musculoskeletal:        General: Normal range of motion.     Cervical back: Normal range of motion and neck supple.  Skin:    General: Skin is warm.     Comments: Small superficial laceration well-approximated at rest.  Bleeding well-controlled.  No foreign bodies appreciable on exam  Neurological:     Mental Status: She is alert and oriented to person, place, and time.     Comments: Left hand neurovascularly intact  Psychiatric:        Mood and Affect: Mood normal.        Thought Content: Thought content normal.        Judgment: Judgment normal.     UC Treatments / Results  Labs (all labs ordered are listed, but only abnormal results are displayed) Labs Reviewed - No data to display  EKG   Radiology No results found.  Procedures Laceration Repair  Date/Time: 07/29/2023 12:45 PM  Performed by: Particia Nearing, PA-C Authorized by: Particia Nearing, PA-C   Consent:    Consent obtained:  Verbal   Consent given by:  Patient   Risks, benefits, and alternatives were discussed: yes     Risks discussed:  Pain, poor cosmetic result and infection   Alternatives discussed:  Observation Universal  protocol:  Procedure explained and questions answered to patient or proxy's satisfaction: yes     Relevant documents present and verified: yes     Patient identity confirmed:  Verbally with patient and arm band Anesthesia:    Anesthesia method:  None Laceration details:    Location:  Finger   Finger location:  L ring finger   Length (cm):  0.5   Depth (mm):  2 Pre-procedure details:    Preparation:  Patient was prepped and draped in usual sterile fashion Exploration:    Limited defect created (wound extended): no     Hemostasis achieved with:  Direct pressure   Wound exploration: wound explored through full range of motion     Wound extent: no foreign bodies/material noted     Contaminated: no   Treatment:    Area cleansed with:  Chlorhexidine   Amount of cleaning:  Standard   Irrigation solution:  Sterile saline   Irrigation method:  Pressure wash   Debridement:  None Skin repair:    Repair method:  Tissue adhesive Approximation:    Approximation:  Close Repair type:    Repair type:  Simple Post-procedure details:    Dressing:  Splint for protection and non-adherent dressing   Procedure completion:  Tolerated well, no immediate complications  (including critical care time)  Medications Ordered in UC Medications - No data to display  Initial Impression / Assessment and Plan / UC Course  I have reviewed the triage vital signs and the nursing notes.  Pertinent labs & imaging results that were available during my care of the patient were reviewed by me and considered in my medical decision making (see chart for details).     Laceration repaired with skin glue without complication.  Patient tolerated procedure well.  Discussed home wound care with Hibiclens and mupirocin once glue comes off, finger splint placed for protection.  Return precautions reviewed.  Tdap up-to-date.  Final Clinical Impressions(s) / UC Diagnoses   Final diagnoses:  Laceration of left ring  finger without foreign body without damage to nail, initial encounter     Discharge Instructions      Let the glue peel off when it is ready.  This is usually about 3 to 4 days.  Once the glue peels off, you may start cleaning the area once to twice daily with the Hibiclens solution and applying the mupirocin ointment.  Keep the area covered with a nonstick dressing until it fully heals.    ED Prescriptions     Medication Sig Dispense Auth. Provider   chlorhexidine (HIBICLENS) 4 % external liquid Apply topically daily as needed. 236 mL Particia Nearing, PA-C   mupirocin ointment (BACTROBAN) 2 % Apply 1 Application topically 2 (two) times daily. 22 g Particia Nearing, [place]      PDMP not reviewed this encounter.   Particia Nearing, [place] 07/29/23 1248

## 2023-09-26 ENCOUNTER — Ambulatory Visit: Payer: Managed Care, Other (non HMO) | Admitting: Obstetrics and Gynecology

## 2023-09-27 ENCOUNTER — Ambulatory Visit: Payer: Managed Care, Other (non HMO) | Admitting: Obstetrics and Gynecology

## 2023-10-14 ENCOUNTER — Other Ambulatory Visit (HOSPITAL_COMMUNITY)
Admission: RE | Admit: 2023-10-14 | Discharge: 2023-10-14 | Disposition: A | Discharge status: Home / Self Care | Source: Ambulatory Visit | Attending: Obstetrics and Gynecology | Admitting: Obstetrics and Gynecology

## 2023-10-14 ENCOUNTER — Encounter: Payer: Self-pay | Admitting: Obstetrics and Gynecology

## 2023-10-14 ENCOUNTER — Ambulatory Visit: Admitting: Obstetrics and Gynecology

## 2023-10-14 VITALS — BP 112/70 | HR 80 | Wt 197.0 lb

## 2023-10-14 DIAGNOSIS — Z8742 Personal history of other diseases of the female genital tract: Secondary | ICD-10-CM | POA: Diagnosis present

## 2023-10-14 DIAGNOSIS — N946 Dysmenorrhea, unspecified: Secondary | ICD-10-CM

## 2023-10-14 DIAGNOSIS — N921 Excessive and frequent menstruation with irregular cycle: Secondary | ICD-10-CM | POA: Diagnosis not present

## 2023-10-14 NOTE — Progress Notes (Signed)
   Acute Office Visit  Subjective:    Patient ID: Tanya Sanders, female    DOB: 04-04-1986, 38 y.o.   MRN: [id number]   HPI 38 y.o. presents today for 6mth pap (6mth pap//jj) .LGSIL on last pap smear Colposcopy with CIN1 Husband with vasectomy Also mentioned today she is dealing with heavy painful periods She is using 2 ppd and bleeds for 5-7 days She is bleeding so heavily that she cannot use a tampon until CD4 otherwise she would soak one every 30 minutes She is not interested in having more children All her family members had hysterectomies in their 84's  Patient's last menstrual period was 09/30/2023 (exact date). Period Duration (Days): 7 Period Pattern: (!) Irregular Menstrual Flow: Heavy Menstrual Control: Maxi pad Dysmenorrhea: (!) Severe Dysmenorrhea Symptoms: Cramping, Nausea, Diarrhea, Headache  Review of Systems     Objective:     OBGyn Exam  BP 112/70   Pulse 80   Wt 197 lb (89.4 kg)   LMP 09/30/2023 (Exact Date)   SpO2 99%   BMI 33.04 kg/m  Wt Readings from Last 3 Encounters:  10/14/23 197 lb (89.4 kg)  02/22/23 182 lb (82.6 kg)  11/05/22 176 lb (79.8 kg)   SVE: no lesions, normal discharge     Joy, CMA was present for the exam Assessment & Plan:  LGSIL CIN1 on colposcopy for repeat pap smear, dysmenorrhea, menorrhagia  Pap smear collected today. To notify patient of the results Counseled on all bleeding options hormonal vs. Non hormonal vs surgical and she will consider her options. She would like to know what the cost is for the [facility].  We discussed the procedure in detail and r/b/a/I were discussed. To get PUS scheduled as well. PA sent to run benefits.  Reinaldo Caras

## 2023-10-19 ENCOUNTER — Ambulatory Visit (INDEPENDENT_AMBULATORY_CARE_PROVIDER_SITE_OTHER)

## 2023-10-19 DIAGNOSIS — N921 Excessive and frequent menstruation with irregular cycle: Secondary | ICD-10-CM

## 2023-10-19 DIAGNOSIS — N946 Dysmenorrhea, unspecified: Secondary | ICD-10-CM | POA: Diagnosis not present

## 2023-10-20 ENCOUNTER — Encounter: Payer: Self-pay | Admitting: Obstetrics and Gynecology

## 2023-10-20 ENCOUNTER — Ambulatory Visit: Admitting: Obstetrics and Gynecology

## 2023-10-20 ENCOUNTER — Ambulatory Visit: Payer: Self-pay | Admitting: Obstetrics and Gynecology

## 2023-10-20 VITALS — BP 108/78 | HR 88

## 2023-10-20 DIAGNOSIS — N946 Dysmenorrhea, unspecified: Secondary | ICD-10-CM | POA: Diagnosis not present

## 2023-10-20 DIAGNOSIS — D219 Benign neoplasm of connective and other soft tissue, unspecified: Secondary | ICD-10-CM

## 2023-10-20 DIAGNOSIS — Z712 Person consulting for explanation of examination or test findings: Secondary | ICD-10-CM

## 2023-10-20 DIAGNOSIS — N8003 Adenomyosis of the uterus: Secondary | ICD-10-CM

## 2023-10-20 NOTE — Patient Instructions (Addendum)
 Thank you for coming in today!  It was so nice seeing you. The pap smear is still pending.  US  results today show a  10.19cm uterus Endometrial lining 15.15mm, which is thickened. Range of 76mm-1cm is expected. Concern for abnormal endometrial cells, endometrial polyp or fibroids can be causes. Normal ovaries 5 fibroids seen: 2.06cm, 0.83cm, 0.70cm, 1.48cm, 0.81cm

## 2023-10-20 NOTE — Progress Notes (Signed)
   Acute Office Visit  Subjective:    Patient ID: Tanya Sanders, female    DOB: 1985/11/21, 38 y.o.   MRN: [id number]   HPI 38 y.o. presents today for Consult (Consult to discuss u/s result//jj/U/s was 10-19-23) . Aaron AasLGSIL on last pap smear Colposcopy with CIN1 Husband with vasectomy Also mentioned today she is dealing with heavy painful periods She is using 2 ppd and bleeds for 5-7 days She is bleeding so heavily that she cannot use a tampon until CD4 otherwise she would soak one every 30 minutes She is not interested in having more children All her family members had hysterectomies in their 10's  Patient's last menstrual period was 09/30/2023 (exact date). Period Duration (Days): 7 Period Pattern: (!) Irregular Menstrual Flow: Heavy Menstrual Control: Maxi pad Dysmenorrhea: (!) Severe Dysmenorrhea Symptoms: Cramping, Nausea, Diarrhea, Headache  Patient's last menstrual period was 09/30/2023 (exact date).    Review of Systems     Objective:     OBGyn Exam  BP 108/78   Pulse 88   LMP 09/30/2023 (Exact Date)   SpO2 99%  Wt Readings from Last 3 Encounters:  10/14/23 197 lb (89.4 kg)  02/22/23 182 lb (82.6 kg)  11/05/22 176 lb (79.8 kg)   10.19cm uterus Endometrial lining 15.41mm, which is thickened. Normal ovaries 5 fibroids seen: 2.06cm, 0.83cm, 0.70cm, 1.48cm, 0.81cm     01/14/22 PUS Narrative & Impression  T/V images.  Anteverted uterus normal size and shape with a small fundal subserosal fibroid measured at 2 cm x 1.4 cm. Slightly it 0 generous myometrium with streaky shadowing suggestive of mild adenomyosis.  The uterus is measured at 8.55 x 5.55 x 4.64 cm.  The endometrial lining is tri-layered, symmetrical, measured at 5.54 mm with no obvious mass or thickening.  Both ovaries are mobile, mildly enlarged, with a multi follicular pattern and normal perfusion.    Patient informed chaperone available to be present for breast and/or pelvic exam. Patient has  requested no chaperone to be present. Patient has been advised what will be completed during breast and pelvic exam.   Assessment & Plan:  Fibroids, Dysmenorrhea Menorrhagia Thickened endometrial lining  Adenomyosis  Discussed options and importance of EMB with hysteroscopy or in office blind sample.  Discussed hysteroscopy has benefit to visualize cavity and sample entire cavity and remove polyps and small fibroids.  She is considering the [facility] as well and will us  know her decision.  Importance of the biopsy was discussed to rule out abnormal cells precancerous or cancerous was discussed as well. Hormonal options reviewed as well. Last US  with adenomyosis.  Counseled against ablation with this and can try progesterone, but may still have irregular or heavy bleeding.  30 minutes spent on reviewing records, imaging,  and one on one patient time and counseling patient and documentation Dr. Caro Christmas

## 2023-10-24 LAB — CYTOLOGY - PAP
Adequacy: ABSENT
Diagnosis: NEGATIVE

## 2023-12-09 ENCOUNTER — Telehealth: Payer: Self-pay | Admitting: Obstetrics and Gynecology

## 2023-12-09 DIAGNOSIS — N859 Noninflammatory disorder of uterus, unspecified: Secondary | ICD-10-CM

## 2023-12-09 NOTE — Telephone Encounter (Signed)
 Patient desires endometrial sample in the OR. Referral placed

## 2023-12-19 ENCOUNTER — Encounter (HOSPITAL_COMMUNITY): Payer: Self-pay | Admitting: Obstetrics and Gynecology

## 2023-12-19 ENCOUNTER — Encounter: Payer: Self-pay | Admitting: *Deleted

## 2023-12-19 NOTE — Progress Notes (Signed)
 Spoke w/ via phone for pre-op interview--- [facility] needs dos----  UPT per anesthesia. Surgeon orders requested 12/19/23        Lab results------ COVID test -----patient states asymptomatic no test needed Arrive at -------1100 NPO after MN NO Solid Food.  Clear liquids from MN until---1000 Pre-Surgery Ensure or G2:  Med rec completed Medications to take morning of surgery -----NONE Diabetic medication -----  GLP1 agonist last dose: GLP1 instructions:  Patient instructed no nail polish to be worn day of surgery Patient instructed to bring photo id and insurance card day of surgery Patient aware to have Driver (ride ) / caregiver    for 24 hours after surgery - Husband Nancyann Mandril Patient Special Instructions ----- Pre-Op special Instructions -----  Patient verbalized understanding of instructions that were given at this phone interview. Patient denies chest pain, sob, fever, cough at the interview.

## 2023-12-21 NOTE — Telephone Encounter (Signed)
 Left message advising surgery time for 12/28/23 has changed to 2:30 PM, arriving at 12:30 PM. Return call to office if any additional questions.

## 2023-12-26 ENCOUNTER — Encounter (HOSPITAL_COMMUNITY): Payer: Self-pay | Admitting: Obstetrics and Gynecology

## 2023-12-26 ENCOUNTER — Other Ambulatory Visit: Payer: Self-pay

## 2023-12-26 NOTE — Progress Notes (Signed)
 SDW CALL  Patient was given pre-op instructions over the phone. The opportunity was given for the patient to ask questions. No further questions asked. Patient verbalized understanding of instructions given.    Date & Arrival time- December 28, 2023 @ 12:15   PCP - Cook, Jayce G, DO Cardiologist -   PPM/ICD - denies Device Orders - n/a Rep Notified - n/a  Chest x-ray - 05-15-22 EKG - 05-15-22 Stress Test - n/a ECHO - n/a Cardiac Cath - n/a  Sleep Study - denies CPAP - n/a  DM -denies  Blood Thinner Instructions: denies Aspirin Instructions:n/a  ERAS Protcol -NPO    COVID TEST- n/a   Anesthesia review: no  Patient denies shortness of breath, fever, cough and chest pain over the phone call   All instructions explained to the patient, with a verbal understanding of the material. Patient agrees to go over the instructions while at home for a better understanding.

## 2023-12-26 NOTE — Progress Notes (Signed)
 Patient aware of time change instructions reviewed and patient voiced understanding

## 2023-12-27 ENCOUNTER — Ambulatory Visit (INDEPENDENT_AMBULATORY_CARE_PROVIDER_SITE_OTHER): Admitting: Obstetrics and Gynecology

## 2023-12-27 ENCOUNTER — Encounter: Payer: Self-pay | Admitting: Obstetrics and Gynecology

## 2023-12-27 VITALS — BP 108/70 | HR 86 | Wt 195.2 lb

## 2023-12-27 DIAGNOSIS — Z01818 Encounter for other preprocedural examination: Secondary | ICD-10-CM

## 2023-12-27 DIAGNOSIS — N921 Excessive and frequent menstruation with irregular cycle: Secondary | ICD-10-CM

## 2023-12-27 DIAGNOSIS — R87612 Low grade squamous intraepithelial lesion on cytologic smear of cervix (LGSIL): Secondary | ICD-10-CM

## 2023-12-27 DIAGNOSIS — N8003 Adenomyosis of the uterus: Secondary | ICD-10-CM

## 2023-12-27 DIAGNOSIS — D219 Benign neoplasm of connective and other soft tissue, unspecified: Secondary | ICD-10-CM | POA: Diagnosis not present

## 2023-12-27 DIAGNOSIS — N946 Dysmenorrhea, unspecified: Secondary | ICD-10-CM | POA: Diagnosis not present

## 2023-12-27 DIAGNOSIS — N859 Noninflammatory disorder of uterus, unspecified: Secondary | ICD-10-CM

## 2023-12-27 NOTE — Telephone Encounter (Signed)
 Patient also left message on surgery line. Spoke with patient.   Patient expressed concerns about planning for surgery with her job. States she was provided option for EMB today in office and hysterectomy tomorrow. Patient request to sign waiver for EMB and proceed with hysterectomy on 12/28/23.   Explained purpose of EMB and how results can change who does hysterectomy if malignant/abnormal. Advised EMB may also be required for authorization from insurance for hysterectomy.   Advised can not change surgery tomorrow to hysterectomy, advised we do not have OR time to accommodate this request.   Patient request to proceed as scheduled with hysteroscopy D&C with ECC on 12/28/23.   Reviewed next available surgery dates, will hold 02/22/24 until results are back, will then proceed with scheduling if normal. This will allow patient planning with employer. Advised I will update Dr. Glennon and return call if any additional recommendations.   Routing to Dr. Glennon

## 2023-12-27 NOTE — H&P (View-Only) (Signed)
 Preop H&P for [facility], ECC  Subjective:    Patient ID: Tanya Sanders, female    DOB: 1985/09/01, 38 y.o.   MRN: [id number]   Gynecologic Exam   38 y.o. presents today for Gynecologic Exam (Pre opt visit) . LGSIL on last pap smear Colposcopy with CIN1 Husband with vasectomy Also mentioned today she is dealing with heavy painful periods She is using 2 ppd and bleeds for 5-7 days She is bleeding so heavily that she cannot use a tampon until CD4 otherwise she would soak one every 30 minutes She is not interested in having more children All her family members had hysterectomies in their 40's    Patient's last menstrual period was 12/01/2023. Period Cycle (Days): 28 Period Duration (Days): 5-7 Period Pattern: Regular Menstrual Flow: Heavy (extra heavy) Menstrual Control: Maxi pad Dysmenorrhea: (!) Severe Dysmenorrhea Symptoms: Cramping, Nausea, Diarrhea, Headache  Review of Systems     Objective:     OBGyn Exam  BP 108/70   Pulse 86   Wt 195 lb 3.2 oz (88.5 kg)   LMP 12/01/2023   SpO2 99%   BMI 32.48 kg/m  Wt Readings from Last 3 Encounters:  12/27/23 195 lb 3.2 oz (88.5 kg)  10/14/23 197 lb (89.4 kg)  02/22/23 182 lb (82.6 kg)   10.19cm uterus Endometrial lining 15.54mm, which is thickened. Normal ovaries 5 fibroids seen: 2.06cm, 0.83cm, 0.70cm, 1.48cm, 0.81cm     01/14/22 PUS Narrative & Impression  T/V images.  Anteverted uterus normal size and shape with a small fundal subserosal fibroid measured at 2 cm x 1.4 cm. Slightly it 0 generous myometrium with streaky shadowing suggestive of mild adenomyosis.  The uterus is measured at 8.55 x 5.55 x 4.64 cm.  The endometrial lining is tri-layered, symmetrical, measured at 5.54 mm with no obvious mass or thickening.  Both ovaries are mobile, mildly enlarged, with a multi follicular pattern and normal perfusion.   Past Medical History:  Diagnosis Date   Abnormal Pap smear of cervix    lgsil   Abscess of  tonsil    H/O breast implant    silicone 09/2022   Past Surgical History:  Procedure Laterality Date   CESAREAN SECTION     CESAREAN SECTION N/A 01/31/2014   Procedure: Repeat CESAREAN SECTION;  Surgeon: Percilla Burly, MD;  Location: [facility];  Service: Obstetrics;  Laterality: N/A;  EDD: 02/06/14   CHOLECYSTECTOMY N/A 09/22/2012   Procedure: LAPAROSCOPIC CHOLECYSTECTOMY;  Surgeon: Elsie GORMAN Holland, MD;  Location: [facility];  Service: General;  Laterality: N/A;   ERCP N/A 10/06/2012   Procedure: ENDOSCOPIC RETROGRADE CHOLANGIOPANCREATOGRAPHY (ERCP);  Surgeon: Lamar CHRISTELLA Hollingshead, MD;  Location: [facility];  Service: Gastroenterology;  Laterality: N/A;   EYE SURGERY  09/19/2020   LASIK Bilateral    08/2020   REMOVAL OF STONES N/A 10/06/2012   Procedure: REMOVAL OF STONES;  Surgeon: Lamar CHRISTELLA Hollingshead, MD;  Location: [facility];  Service: Gastroenterology;  Laterality: N/A;   SPHINCTEROTOMY N/A 10/06/2012   Procedure: SPHINCTEROTOMY;  Surgeon: Lamar CHRISTELLA Hollingshead, MD;  Location: [facility];  Service: Gastroenterology;  Laterality: N/A;   TONSILLECTOMY N/A 06/03/2016   Procedure: TONSILLECTOMY;  Surgeon: Ida Loader, MD;  Location: [facility] OR;  Service: ENT;  Laterality: N/A;   Social History   Socioeconomic History   Marital status: Married    Spouse name: Not on file   Number of children: Not on file   Years of education: Not on file   Highest education  level: Not on file  Occupational History   Occupation: Teacher, adult education: [employer]  Tobacco Use   Smoking status: Former    Current packs/day: 0.00    Average packs/day: 1 pack/day for 11.0 years (11.0 ttl pk-yrs)    Types: Cigarettes    Start date: 10/26/2001    Quit date: 08/22/2012    Years since quitting: 11.3   Smokeless tobacco: Never  Vaping Use   Vaping status: Never Used  Substance and Sexual Activity   Alcohol use: Yes    Comment: 1 a week   Drug use: No   Sexual activity: Yes    Partners: Male    Birth control/protection: Other-see  comments    Comment: 1st intercourse- 16, partners- more than 5, husband vasectomy  Other Topics Concern   Not on file  Social History Narrative   Not on file   Social Drivers of Health   Financial Resource Strain: Not on file  Food Insecurity: Not on file  Transportation Needs: Not on file  Physical Activity: Not on file  Stress: Not on file  Social Connections: Not on file   OB History     Gravida  2   Para  2   Term  1   Preterm  0   AB  0   Living  2      SAB  0   IAB  0   Ectopic  0   Multiple  0   Live Births  2          Current Outpatient Medications on File Prior to Visit  Medication Sig Dispense Refill   ibuprofen  (ADVIL ) 200 MG tablet Take 400-600 mg by mouth every 8 (eight) hours as needed (pain.).     No current facility-administered medications on file prior to visit.   Allergies  Allergen Reactions   Phentermine  Itching   Bactrim [Sulfamethoxazole-Trimethoprim] Rash   Penicillins Rash   Sulfa Antibiotics Rash    Assessment & Plan:  Fibroids, Dysmenorrhea Menorrhagia Thickened endometrial lining  Adenomyosis Preop for myosure D&C, ECC   The hysteroscopy with the myosure D&C, ECC was reviewed and discussed in detail with the patient.  The risk of bleeding, infection, injury to surrounding structures, uterine perforation with risk of additional procedure laparoscopy/laparotomy were all reviewed discussed with patient.  Risk for blood clots was discussed.  Discussed risk for bleeding after the procedure anywhere from 7 to 10 days.  She will need a driver to and from surgery as well.  Pathology results will take about 2 weeks to return.  The risks of surgery were discussed in detail with the patient including but not limited to: bleeding which may require transfusion or reoperation; infection which may require prolonged hospitalization or re-hospitalization and antibiotic therapy; injury to bowel, bladder, ureters and major vessels or  other surrounding organs which may lead to other procedures; formation of adhesions; need for additional procedures including laparotomy or subsequent procedures secondary to intraoperative injury or abnormal pathology; thromboembolic phenomenon; incisional problems and other postoperative or anesthesia complications.  The postoperative expectations were also discussed in detail. The patient also understands the alternative treatment options which were discussed in full. All questions were answered.  Patient would like to proceed with the procedure.   She has decided on the [facility] after the  [facility] and would like this scheduled as soon as possible. Case request sent  30 minutes spent on reviewing records, imaging,  and one on one  patient time and counseling patient and documentation   [name]

## 2023-12-27 NOTE — Patient Instructions (Signed)
 Post-procedure Instructions after myosure D&C, ECC Cramping is common.  You may take Ibuprofen, Aleve, or Tylenol for the cramping.  This should resolve within the next two to three days.   You may have bright red spotting or blackish discharge for several days after your procedure.  The discharge occurs because of a topical solution used to stop bleeding at the biopsy site(s).  The dark discharge will lighten and then turn clear before completely resolving.  You will need to wear a mini pad during this time. Marland Kitchen Refrain from putting anything in the vagina until the bleeding and/or discharge COMPLETLEY stops (usually two to three weeks). You need to call the office if you have any pelvic pain, fever, heavy bleeding, dysuria, foul smelling vaginal discharge, or if you are concerned. Showers only for 2 weeks You will be notified within one week of your biopsy results or we will discuss your results at your follow-up appointment if needed.

## 2023-12-27 NOTE — Anesthesia Preprocedure Evaluation (Signed)
 Anesthesia Evaluation  Patient identified by MRN, date of birth, ID band Patient awake    Reviewed: Allergy & Precautions, NPO status , Patient's Chart, lab work & pertinent test results  Airway Mallampati: I  TM Distance: >3 FB Neck ROM: Full    Dental no notable dental hx. (+) Teeth Intact, Dental Advisory Given   Pulmonary former smoker   Pulmonary exam normal breath sounds clear to auscultation       Cardiovascular Normal cardiovascular exam Rhythm:Regular Rate:Normal  HLD   Neuro/Psych negative neurological ROS  negative psych ROS   GI/Hepatic negative GI ROS, Neg liver ROS,,,  Endo/Other  negative endocrine ROS  Obese BMI 32  Renal/GU negative Renal ROS  negative genitourinary   Musculoskeletal negative musculoskeletal ROS (+)    Abdominal   Peds  Hematology negative hematology ROS (+)   Anesthesia Other Findings Sulfa Phenteramine, PCns  Reproductive/Obstetrics                              Anesthesia Physical Anesthesia Plan  ASA: 2  Anesthesia Plan: General   Post-op Pain Management: Toradol  IV (intra-op)*, Precedex and Tylenol  PO (pre-op)*   Induction: Intravenous  PONV Risk Score and Plan: Treatment may vary due to age or medical condition, TIVA, Propofol  infusion, Midazolam  and Ondansetron   Airway Management Planned: LMA  Additional Equipment: None  Intra-op Plan:   Post-operative Plan: Extubation in OR  Informed Consent:      Dental advisory given  Plan Discussed with:   Anesthesia Plan Comments:          Anesthesia Quick Evaluation

## 2023-12-27 NOTE — Progress Notes (Signed)
 Preop H&P for [facility], ECC  Subjective:    Patient ID: Tanya Sanders, female    DOB: 1985/09/01, 38 y.o.   MRN: [id number]   Gynecologic Exam   38 y.o. presents today for Gynecologic Exam (Pre opt visit) . LGSIL on last pap smear Colposcopy with CIN1 Husband with vasectomy Also mentioned today she is dealing with heavy painful periods She is using 2 ppd and bleeds for 5-7 days She is bleeding so heavily that she cannot use a tampon until CD4 otherwise she would soak one every 30 minutes She is not interested in having more children All her family members had hysterectomies in their 40's    Patient's last menstrual period was 12/01/2023. Period Cycle (Days): 28 Period Duration (Days): 5-7 Period Pattern: Regular Menstrual Flow: Heavy (extra heavy) Menstrual Control: Maxi pad Dysmenorrhea: (!) Severe Dysmenorrhea Symptoms: Cramping, Nausea, Diarrhea, Headache  Review of Systems     Objective:     OBGyn Exam  BP 108/70   Pulse 86   Wt 195 lb 3.2 oz (88.5 kg)   LMP 12/01/2023   SpO2 99%   BMI 32.48 kg/m  Wt Readings from Last 3 Encounters:  12/27/23 195 lb 3.2 oz (88.5 kg)  10/14/23 197 lb (89.4 kg)  02/22/23 182 lb (82.6 kg)   10.19cm uterus Endometrial lining 15.54mm, which is thickened. Normal ovaries 5 fibroids seen: 2.06cm, 0.83cm, 0.70cm, 1.48cm, 0.81cm     01/14/22 PUS Narrative & Impression  T/V images.  Anteverted uterus normal size and shape with a small fundal subserosal fibroid measured at 2 cm x 1.4 cm. Slightly it 0 generous myometrium with streaky shadowing suggestive of mild adenomyosis.  The uterus is measured at 8.55 x 5.55 x 4.64 cm.  The endometrial lining is tri-layered, symmetrical, measured at 5.54 mm with no obvious mass or thickening.  Both ovaries are mobile, mildly enlarged, with a multi follicular pattern and normal perfusion.   Past Medical History:  Diagnosis Date   Abnormal Pap smear of cervix    lgsil   Abscess of  tonsil    H/O breast implant    silicone 09/2022   Past Surgical History:  Procedure Laterality Date   CESAREAN SECTION     CESAREAN SECTION N/A 01/31/2014   Procedure: Repeat CESAREAN SECTION;  Surgeon: Percilla Burly, MD;  Location: [facility];  Service: Obstetrics;  Laterality: N/A;  EDD: 02/06/14   CHOLECYSTECTOMY N/A 09/22/2012   Procedure: LAPAROSCOPIC CHOLECYSTECTOMY;  Surgeon: Elsie GORMAN Holland, MD;  Location: [facility];  Service: General;  Laterality: N/A;   ERCP N/A 10/06/2012   Procedure: ENDOSCOPIC RETROGRADE CHOLANGIOPANCREATOGRAPHY (ERCP);  Surgeon: Lamar CHRISTELLA Hollingshead, MD;  Location: [facility];  Service: Gastroenterology;  Laterality: N/A;   EYE SURGERY  09/19/2020   LASIK Bilateral    08/2020   REMOVAL OF STONES N/A 10/06/2012   Procedure: REMOVAL OF STONES;  Surgeon: Lamar CHRISTELLA Hollingshead, MD;  Location: [facility];  Service: Gastroenterology;  Laterality: N/A;   SPHINCTEROTOMY N/A 10/06/2012   Procedure: SPHINCTEROTOMY;  Surgeon: Lamar CHRISTELLA Hollingshead, MD;  Location: [facility];  Service: Gastroenterology;  Laterality: N/A;   TONSILLECTOMY N/A 06/03/2016   Procedure: TONSILLECTOMY;  Surgeon: Ida Loader, MD;  Location: [facility];  Service: ENT;  Laterality: N/A;   Social History   Socioeconomic History   Marital status: Married    Spouse name: Not on file   Number of children: Not on file   Years of education: Not on file   Highest education  level: Not on file  Occupational History   Occupation: Teacher, adult education: [employer]  Tobacco Use   Smoking status: Former    Current packs/day: 0.00    Average packs/day: 1 pack/day for 11.0 years (11.0 ttl pk-yrs)    Types: Cigarettes    Start date: 10/26/2001    Quit date: 08/22/2012    Years since quitting: 11.3   Smokeless tobacco: Never  Vaping Use   Vaping status: Never Used  Substance and Sexual Activity   Alcohol use: Yes    Comment: 1 a week   Drug use: No   Sexual activity: Yes    Partners: Male    Birth control/protection: Other-see  comments    Comment: 1st intercourse- 16, partners- more than 5, husband vasectomy  Other Topics Concern   Not on file  Social History Narrative   Not on file   Social Drivers of Health   Financial Resource Strain: Not on file  Food Insecurity: Not on file  Transportation Needs: Not on file  Physical Activity: Not on file  Stress: Not on file  Social Connections: Not on file   OB History     Gravida  2   Para  2   Term  1   Preterm  0   AB  0   Living  2      SAB  0   IAB  0   Ectopic  0   Multiple  0   Live Births  2          Current Outpatient Medications on File Prior to Visit  Medication Sig Dispense Refill   ibuprofen  (ADVIL ) 200 MG tablet Take 400-600 mg by mouth every 8 (eight) hours as needed (pain.).     No current facility-administered medications on file prior to visit.   Allergies  Allergen Reactions   Phentermine  Itching   Bactrim [Sulfamethoxazole-Trimethoprim] Rash   Penicillins Rash   Sulfa Antibiotics Rash    Assessment & Plan:  Fibroids, Dysmenorrhea Menorrhagia Thickened endometrial lining  Adenomyosis Preop for myosure D&C, ECC   The hysteroscopy with the myosure D&C, ECC was reviewed and discussed in detail with the patient.  The risk of bleeding, infection, injury to surrounding structures, uterine perforation with risk of additional procedure laparoscopy/laparotomy were all reviewed discussed with patient.  Risk for blood clots was discussed.  Discussed risk for bleeding after the procedure anywhere from 7 to 10 days.  She will need a driver to and from surgery as well.  Pathology results will take about 2 weeks to return.  The risks of surgery were discussed in detail with the patient including but not limited to: bleeding which may require transfusion or reoperation; infection which may require prolonged hospitalization or re-hospitalization and antibiotic therapy; injury to bowel, bladder, ureters and major vessels or  other surrounding organs which may lead to other procedures; formation of adhesions; need for additional procedures including laparotomy or subsequent procedures secondary to intraoperative injury or abnormal pathology; thromboembolic phenomenon; incisional problems and other postoperative or anesthesia complications.  The postoperative expectations were also discussed in detail. The patient also understands the alternative treatment options which were discussed in full. All questions were answered.  Patient would like to proceed with the procedure.   She has decided on the [facility] after the  [facility] and would like this scheduled as soon as possible. Case request sent  30 minutes spent on reviewing records, imaging,  and one on one  patient time and counseling patient and documentation   [name]

## 2023-12-28 ENCOUNTER — Encounter (HOSPITAL_COMMUNITY): Payer: Self-pay | Admitting: Obstetrics and Gynecology

## 2023-12-28 ENCOUNTER — Encounter (HOSPITAL_COMMUNITY)
Admission: RE | Disposition: A | Discharge status: Home / Self Care | Payer: Self-pay | Source: Home / Self Care | Attending: Obstetrics and Gynecology

## 2023-12-28 ENCOUNTER — Ambulatory Visit (HOSPITAL_COMMUNITY): Payer: Self-pay | Admitting: Anesthesiology

## 2023-12-28 ENCOUNTER — Ambulatory Visit (HOSPITAL_COMMUNITY)
Admission: RE | Admit: 2023-12-28 | Discharge: 2023-12-28 | Disposition: A | Discharge status: Home / Self Care | Attending: Obstetrics and Gynecology | Admitting: Obstetrics and Gynecology

## 2023-12-28 ENCOUNTER — Other Ambulatory Visit: Payer: Self-pay

## 2023-12-28 DIAGNOSIS — Z87891 Personal history of nicotine dependence: Secondary | ICD-10-CM | POA: Diagnosis not present

## 2023-12-28 DIAGNOSIS — N946 Dysmenorrhea, unspecified: Secondary | ICD-10-CM | POA: Diagnosis not present

## 2023-12-28 DIAGNOSIS — Z01818 Encounter for other preprocedural examination: Secondary | ICD-10-CM

## 2023-12-28 DIAGNOSIS — N859 Noninflammatory disorder of uterus, unspecified: Secondary | ICD-10-CM | POA: Diagnosis present

## 2023-12-28 DIAGNOSIS — D219 Benign neoplasm of connective and other soft tissue, unspecified: Secondary | ICD-10-CM

## 2023-12-28 DIAGNOSIS — N921 Excessive and frequent menstruation with irregular cycle: Secondary | ICD-10-CM | POA: Diagnosis not present

## 2023-12-28 DIAGNOSIS — N840 Polyp of corpus uteri: Secondary | ICD-10-CM | POA: Diagnosis not present

## 2023-12-28 DIAGNOSIS — N8003 Adenomyosis of the uterus: Secondary | ICD-10-CM | POA: Diagnosis not present

## 2023-12-28 HISTORY — PX: DILATATION & CURRETTAGE/HYSTEROSCOPY WITH RESECTOCOPE: SHX5572

## 2023-12-28 LAB — CBC
HCT: 39.3 % (ref 36.0–46.0)
Hemoglobin: 13.1 g/dL (ref 12.0–15.0)
MCH: 28.4 pg (ref 26.0–34.0)
MCHC: 33.3 g/dL (ref 30.0–36.0)
MCV: 85.1 fL (ref 80.0–100.0)
Platelets: 307 K/uL (ref 150–400)
RBC: 4.62 MIL/uL (ref 3.87–5.11)
RDW: 12.6 % (ref 11.5–15.5)
WBC: 7.9 K/uL (ref 4.0–10.5)
nRBC: 0 % (ref 0.0–0.2)

## 2023-12-28 LAB — POCT PREGNANCY, URINE: Preg Test, Ur: NEGATIVE

## 2023-12-28 SURGERY — DILATATION & CURETTAGE/HYSTEROSCOPY WITH RESECTOCOPE
Anesthesia: General

## 2023-12-28 MED ORDER — ONDANSETRON HCL 4 MG/2ML IJ SOLN
INTRAMUSCULAR | Status: DC | PRN
  Administered 2023-12-28: 4 mg via INTRAVENOUS

## 2023-12-28 MED ORDER — MONSELS FERRIC SUBSULFATE EX SOLN
CUTANEOUS | Status: AC
  Filled 2023-12-28: qty 8

## 2023-12-28 MED ORDER — ACETAMINOPHEN 500 MG PO TABS
ORAL_TABLET | ORAL | Status: DC
Start: 2023-12-28 — End: 2023-12-28
  Filled 2023-12-28: qty 2

## 2023-12-28 MED ORDER — ONDANSETRON HCL 4 MG/2ML IJ SOLN
INTRAMUSCULAR | Status: AC
  Filled 2023-12-28: qty 4

## 2023-12-28 MED ORDER — CHLORHEXIDINE GLUCONATE 0.12 % MT SOLN
15.0000 mL | Freq: Once | OROMUCOSAL | Status: AC
  Administered 2023-12-28: 15 mL via OROMUCOSAL

## 2023-12-28 MED ORDER — ROCURONIUM BROMIDE 10 MG/ML (PF) SYRINGE
PREFILLED_SYRINGE | INTRAVENOUS | Status: AC
Start: 2023-12-28 — End: 2023-12-28
  Filled 2023-12-28: qty 30

## 2023-12-28 MED ORDER — MIDAZOLAM HCL 2 MG/2ML IJ SOLN
INTRAMUSCULAR | Status: AC
  Filled 2023-12-28: qty 2

## 2023-12-28 MED ORDER — SUCCINYLCHOLINE CHLORIDE 200 MG/10ML IV SOSY
PREFILLED_SYRINGE | INTRAVENOUS | Status: AC
  Filled 2023-12-28: qty 20

## 2023-12-28 MED ORDER — PROPOFOL 10 MG/ML IV BOLUS
INTRAVENOUS | Status: AC
  Filled 2023-12-28: qty 20

## 2023-12-28 MED ORDER — KETOROLAC TROMETHAMINE 30 MG/ML IJ SOLN
INTRAMUSCULAR | Status: DC | PRN
Start: 2023-12-28 — End: 2023-12-28
  Administered 2023-12-28: 30 mg via INTRAVENOUS

## 2023-12-28 MED ORDER — ACETAMINOPHEN 500 MG PO TABS: 1000.0000 mg | ORAL_TABLET | ORAL | Status: DC

## 2023-12-28 MED ORDER — CHLORHEXIDINE GLUCONATE 0.12 % MT SOLN
OROMUCOSAL | Status: DC
Start: 2023-12-28 — End: 2023-12-28
  Filled 2023-12-28: qty 15

## 2023-12-28 MED ORDER — ACETAMINOPHEN 500 MG PO TABS
1000.0000 mg | ORAL_TABLET | ORAL | Status: AC
  Administered 2023-12-28: 1000 mg via ORAL

## 2023-12-28 MED ORDER — FENTANYL CITRATE (PF) 250 MCG/5ML IJ SOLN
INTRAMUSCULAR | Status: AC
  Filled 2023-12-28: qty 5

## 2023-12-28 MED ORDER — LIDOCAINE 2% (20 MG/ML) 5 ML SYRINGE
INTRAMUSCULAR | Status: DC | PRN
  Administered 2023-12-28: 100 mg via INTRAVENOUS

## 2023-12-28 MED ORDER — PROPOFOL 10 MG/ML IV BOLUS
INTRAVENOUS | Status: DC | PRN
  Administered 2023-12-28: 200 mg via INTRAVENOUS

## 2023-12-28 MED ORDER — EPHEDRINE 5 MG/ML INJ
INTRAVENOUS | Status: AC
Start: 2023-12-28 — End: 2023-12-28
  Filled 2023-12-28: qty 10

## 2023-12-28 MED ORDER — LACTATED RINGERS IV SOLN: INTRAVENOUS | Status: DC

## 2023-12-28 MED ORDER — LIDOCAINE 2% (20 MG/ML) 5 ML SYRINGE
INTRAMUSCULAR | Status: AC
  Filled 2023-12-28: qty 10

## 2023-12-28 MED ORDER — DEXAMETHASONE SODIUM PHOSPHATE 10 MG/ML IJ SOLN
INTRAMUSCULAR | Status: DC | PRN
  Administered 2023-12-28: 10 mg via INTRAVENOUS

## 2023-12-28 MED ORDER — SILVER NITRATE-POT NITRATE 75-25 % EX MISC
CUTANEOUS | Status: AC
  Filled 2023-12-28: qty 10

## 2023-12-28 MED ORDER — KETOROLAC TROMETHAMINE 30 MG/ML IJ SOLN
INTRAMUSCULAR | Status: AC
Start: 2023-12-28 — End: 2023-12-28
  Filled 2023-12-28: qty 1

## 2023-12-28 MED ORDER — MIDAZOLAM HCL 2 MG/2ML IJ SOLN
INTRAMUSCULAR | Status: DC | PRN
  Administered 2023-12-28: 2 mg via INTRAVENOUS

## 2023-12-28 MED ORDER — PHENYLEPHRINE 80 MCG/ML (10ML) SYRINGE FOR IV PUSH (FOR BLOOD PRESSURE SUPPORT)
PREFILLED_SYRINGE | INTRAVENOUS | Status: AC
  Filled 2023-12-28: qty 10

## 2023-12-28 MED ORDER — POVIDONE-IODINE 10 % EX SWAB: 2.0000 | Freq: Once | CUTANEOUS | Status: DC

## 2023-12-28 MED ORDER — DEXAMETHASONE SODIUM PHOSPHATE 10 MG/ML IJ SOLN
INTRAMUSCULAR | Status: AC
Start: 2023-12-28 — End: 2023-12-28
  Filled 2023-12-28: qty 2

## 2023-12-28 MED ORDER — MONSELS FERRIC SUBSULFATE EX SOLN
CUTANEOUS | Status: DC | PRN
  Administered 2023-12-28: 1 via TOPICAL

## 2023-12-28 MED ORDER — ORAL CARE MOUTH RINSE: 15.0000 mL | Freq: Once | OROMUCOSAL | Status: AC

## 2023-12-28 MED ORDER — SODIUM CHLORIDE 0.9 % IR SOLN
Status: DC | PRN
Start: 2023-12-28 — End: 2023-12-28
  Administered 2023-12-28: 1000 mL

## 2023-12-28 MED ORDER — FENTANYL CITRATE (PF) 250 MCG/5ML IJ SOLN
INTRAMUSCULAR | Status: DC | PRN
  Administered 2023-12-28: 50 ug via INTRAVENOUS

## 2023-12-28 SURGICAL SUPPLY — 16 items
CATH ROBINSON RED A/P 16FR (CATHETERS) IMPLANT
COVER MAYO STAND STRL (DRAPES) ×1 IMPLANT
DEVICE MYOSURE LITE (MISCELLANEOUS) IMPLANT
DEVICE MYOSURE REACH (MISCELLANEOUS) IMPLANT
DILATOR CANAL MILEX (MISCELLANEOUS) IMPLANT
GLOVE NEODERM STER SZ 7 (GLOVE) ×1 IMPLANT
GLOVE SURG UNDER POLY LF SZ7 (GLOVE) ×1 IMPLANT
GOWN STRL REUS W/ TWL XL LVL3 (GOWN DISPOSABLE) ×1 IMPLANT
KIT PROCEDURE FLUENT (KITS) ×1 IMPLANT
KIT TURNOVER KIT B (KITS) ×1 IMPLANT
PACK VAGINAL MINOR WOMEN LF (CUSTOM PROCEDURE TRAY) ×1 IMPLANT
PAD OB MATERNITY 11 LF (PERSONAL CARE ITEMS) ×1 IMPLANT
SCOPETTES 8 STERILE (MISCELLANEOUS) ×1 IMPLANT
SEAL ROD LENS SCOPE MYOSURE (ABLATOR) ×1 IMPLANT
TOWEL GREEN STERILE FF (TOWEL DISPOSABLE) ×1 IMPLANT
UNDERPAD 30X36 HEAVY ABSORB (UNDERPADS AND DIAPERS) ×1 IMPLANT

## 2023-12-28 NOTE — Interval H&P Note (Signed)
 History and Physical Interval Note:  12/28/2023 12:43 PM  Tanya Sanders  has presented today for surgery, with the diagnosis of Disorder of endometrium.  The various methods of treatment have been discussed with the patient and family. After consideration of risks, benefits and other options for treatment, the patient has consented to  Procedure(s) with comments: DILATATION & CURETTAGE/HYSTEROSCOPY WITH RESECTOCOPE (N/A) - Myosure and ECC as a surgical intervention.  The patient's history has been reviewed, patient examined, no change in status, stable for surgery.  I have reviewed the patient's chart and labs.  Questions were answered to the patient's satisfaction.     Tanya Sanders

## 2023-12-28 NOTE — Discharge Instructions (Signed)
     No ibuprofen , Advil , Aleve, Motrin , ketorolac , meloxicam, naproxen, or other NSAIDS until after 10:00 pm today if needed.  No acetaminophen /Tylenol  until after 7:00 pm today if needed.     Post Anesthesia Home Care Instructions  Activity: Get plenty of rest for the remainder of the day. A responsible individual must stay with you for 24 hours following the procedure.  For the next 24 hours, DO NOT: -Drive a car -Advertising copywriter -Drink alcoholic beverages -Take any medication unless instructed by your physician -Make any legal decisions or sign important papers.  Meals: Start with liquid foods such as gelatin or soup. Progress to regular foods as tolerated. Avoid greasy, spicy, heavy foods. If nausea and/or vomiting occur, drink only clear liquids until the nausea and/or vomiting subsides. Call your physician if vomiting continues.  Special Instructions/Symptoms: Your throat may feel dry or sore from the anesthesia or the breathing tube placed in your throat during surgery. If this causes discomfort, gargle with warm salt water . The discomfort should disappear within 24 hours.

## 2023-12-28 NOTE — Anesthesia Procedure Notes (Signed)
 Procedure Name: Intubation Date/Time: 12/28/2023 2:12 PM  Performed by: Lamar Lucie DASEN, CRNAPre-anesthesia Checklist: Patient identified, Emergency Drugs available, Suction available and Patient being monitored Patient Re-evaluated:Patient Re-evaluated prior to induction Oxygen Delivery Method: Circle system utilized Preoxygenation: Pre-oxygenation with 100% oxygen Induction Type: IV induction Ventilation: Mask ventilation without difficulty LMA: LMA inserted LMA Size: 4.0 Number of attempts: 1 Airway Equipment and Method: Stylet and Oral airway Placement Confirmation: positive ETCO2, breath sounds checked- equal and bilateral and CO2 detector Tube secured with: Tape Dental Injury: Teeth and Oropharynx as per pre-operative assessment

## 2023-12-28 NOTE — Op Note (Signed)
 12/28/2023 Tanya Sanders [id number]    OPERATIVE REPORT  Preop Diagnosis: menorrhagia, dysmenorrhea, dysfunction uterine bleeding, thickened endometrial lining  Post operative diagnosis: same  Procedure: Myosure hysteroscopy Dilation and curettage, endocervical curettage  Surgeon: Dr. Almarie Rollo Carpen Assistant: none Circulator: Gerome Verla SAILOR, RN Scrub Person: Orvil Asberry POUR Vendor Representative : Phebe Pac Circulator Assistant: Bridgett Waddell PARAS, RN   Fluids: please see anesthesia report   Complications: None Anesthesia: MAC   Findings: Small appearing cervix and uterine cavity measuring 8cm with both ostia seen.  A small endometrial polyp was seen and removed.  No submucosal fibroids were seen.  Estimated blood loss: Minimal  Specimens: Endometrial curettings, ECC  Disposition of specimen: Pathology    Procedure: Patient was taken to the OR where she was placed in dorsal lithotomy in Allen stirrups. SCDs were in place.  The patient was prepped and draped in the usual sterile fashion. An adequate timeout was obtained and everyone agreed. A red rubber catheter is used to drain her bladder. A bivalve speculum was placed inside the vagina and the cervix visualized. The cervix was grasped anteriorly with a single-tooth tenaculum.  The uterus sounded to 8 cm. Sequential dilation was done to a #8 dilator, and the myosure hysteroscope was introduced into the uterine cavity. The cervix and endometrial lining appeared normal both ostia were seen there was no deformity of the cavity. A seal test was done and was passed. A small polyp was seen and removed with the myosure light.  This was also used to sample the entire cavity.  A sharp curettage was then performed to ensure complete sampling of the cavity. and was sent to pathology. All instrument, sponge and lap counts were correct x2. The patient was awakened taken to recovery room in stable  condition. Almarie POUR Carpen MD 12/28/2023 2:53 PM

## 2023-12-28 NOTE — Anesthesia Postprocedure Evaluation (Signed)
 Anesthesia Post Note  Patient: [name]  Procedure(s) Performed: DILATATION & CURETTAGE/HYSTEROSCOPY WITH RESECTOCOPE     Patient location during evaluation: PACU Anesthesia Type: General Level of consciousness: awake and alert Pain management: pain level controlled Vital Signs Assessment: post-procedure vital signs reviewed and stable Respiratory status: spontaneous breathing, nonlabored ventilation, respiratory function stable and patient connected to nasal cannula oxygen Cardiovascular status: blood pressure returned to baseline and stable Postop Assessment: no apparent nausea or vomiting Anesthetic complications: no   No notable events documented.  Last Vitals:  Vitals:   12/28/23 1500 12/28/23 1515  BP: 93/74 110/79  Pulse: 60 61  Resp: (!) 8 17  Temp:  36.7 C  SpO2: 100% 100%    Last Pain:  Vitals:   12/28/23 1515  TempSrc:   PainSc: 1    Pain Goal: Patients Stated Pain Goal: 7 (12/28/23 1515)                 Ishan Sanroman L Ruston Fedora

## 2023-12-28 NOTE — H&P (Signed)
 Office Visit 12/27/2023 [facility], Tanya POUR, MD Obstetrics and Gynecology Preop examination +6 more Dx Gynecologic Exam ; Referred by Cook, Jayce G, DO Reason for Visit   Additional Documentation  Vitals: BP 108/70   Pulse 86   Wt 88.5 kg   LMP 12/01/2023   SpO2 99%   BMI 32.48 kg/m   BSA 2.01 m  Flowsheets: NEWS,   MEWS Score,   Vital Signs,   Anthropometrics,   Menstrual History  Encounter Info: Billing Info,   History,   Allergies,   Detailed Report   All Notes   Progress Notes by Glennon Tanya POUR, MD at 12/27/2023 2:15 PM  Author: Glennon Tanya POUR, MD Author Type: Physician Filed: 12/27/2023  2:53 PM  Note Status: Signed Cosign: Cosign Not Required Encounter Date: 12/27/2023  Editor: Glennon Tanya POUR, MD (Physician)             Expand All Collapse All    Preop H&P for [facility], ECC   Subjective:    Subjective Patient ID: Tanya Sanders, female    DOB: 03-31-1986, 38 y.o.   MRN: [id number]     Gynecologic Exam    38 y.o. presents today for Gynecologic Exam (Pre opt visit) . LGSIL on last pap smear Colposcopy with CIN1 Husband with vasectomy Also mentioned today she is dealing with heavy painful periods She is using 2 ppd and bleeds for 5-7 days She is bleeding so heavily that she cannot use a tampon until CD4 otherwise she would soak one every 30 minutes She is not interested in having more children All her family members had hysterectomies in their 40's       Patient's last menstrual period was 12/01/2023. Period Cycle (Days): 28 Period Duration (Days): 5-7 Period Pattern: Regular Menstrual Flow: Heavy (extra heavy) Menstrual Control: Maxi pad Dysmenorrhea: (!) Severe Dysmenorrhea Symptoms: Cramping, Nausea, Diarrhea, Headache   Review of Systems       Objective:      Objective OBGyn Exam   BP 108/70   Pulse 86   Wt 195 lb 3.2 oz (88.5 kg)   LMP 12/01/2023    SpO2 99%   BMI 32.48 kg/m     Wt Readings from Last 3 Encounters:  12/27/23 195 lb 3.2 oz (88.5 kg)  10/14/23 197 lb (89.4 kg)  02/22/23 182 lb (82.6 kg)    10.19cm uterus Endometrial lining 15.60mm, which is thickened. Normal ovaries 5 fibroids seen: 2.06cm, 0.83cm, 0.70cm, 1.48cm, 0.81cm       01/14/22 PUS Narrative & Impression  T/V images.  Anteverted uterus normal size and shape with a small fundal subserosal fibroid measured at 2 cm x 1.4 cm. Slightly it 0 generous myometrium with streaky shadowing suggestive of mild adenomyosis.  The uterus is measured at 8.55 x 5.55 x 4.64 cm.  The endometrial lining is tri-layered, symmetrical, measured at 5.54 mm with no obvious mass or thickening.  Both ovaries are mobile, mildly enlarged, with a multi follicular pattern and normal perfusion.        Past Medical History:  Diagnosis Date   Abnormal Pap smear of cervix      lgsil   Abscess of tonsil     H/O breast implant      silicone 09/2022             Past Surgical History:  Procedure Laterality Date   CESAREAN SECTION       CESAREAN SECTION  N/A 01/31/2014    Procedure: Repeat CESAREAN SECTION;  Surgeon: Marie-Lyne Lavoie, MD;  Location: [facility];  Service: Obstetrics;  Laterality: N/A;  EDD: 02/06/14   CHOLECYSTECTOMY N/A 09/22/2012    Procedure: LAPAROSCOPIC CHOLECYSTECTOMY;  Surgeon: Elsie GORMAN Holland, MD;  Location: [facility];  Service: General;  Laterality: N/A;   ERCP N/A 10/06/2012    Procedure: ENDOSCOPIC RETROGRADE CHOLANGIOPANCREATOGRAPHY (ERCP);  Surgeon: Lamar CHRISTELLA Hollingshead, MD;  Location: [facility];  Service: Gastroenterology;  Laterality: N/A;   EYE SURGERY   09/19/2020   LASIK Bilateral      08/2020   REMOVAL OF STONES N/A 10/06/2012    Procedure: REMOVAL OF STONES;  Surgeon: Lamar CHRISTELLA Hollingshead, MD;  Location: [facility];  Service: Gastroenterology;  Laterality: N/A;   SPHINCTEROTOMY N/A 10/06/2012    Procedure: SPHINCTEROTOMY;  Surgeon: Lamar CHRISTELLA Hollingshead, MD;  Location: [facility];   Service: Gastroenterology;  Laterality: N/A;   TONSILLECTOMY N/A 06/03/2016    Procedure: TONSILLECTOMY;  Surgeon: Ida Loader, MD;  Location: [facility] OR;  Service: ENT;  Laterality: N/A;        Social History         Socioeconomic History   Marital status: Married      Spouse name: Not on file   Number of children: Not on file   Years of education: Not on file   Highest education level: Not on file  Occupational History   Occupation: Charity fundraiser      Employer: [employer]  Tobacco Use   Smoking status: Former      Current packs/day: 0.00      Average packs/day: 1 pack/day for 11.0 years (11.0 ttl pk-yrs)      Types: Cigarettes      Start date: 10/26/2001      Quit date: 08/22/2012      Years since quitting: 11.3   Smokeless tobacco: Never  Vaping Use   Vaping status: Never Used  Substance and Sexual Activity   Alcohol use: Yes      Comment: 1 a week   Drug use: No   Sexual activity: Yes      Partners: Male      Birth control/protection: Other-see comments      Comment: 1st intercourse- 16, partners- more than 5, husband vasectomy  Other Topics Concern   Not on file  Social History Narrative   Not on file    Social Drivers of Health    Financial Resource Strain: Not on file  Food Insecurity: Not on file  Transportation Needs: Not on file  Physical Activity: Not on file  Stress: Not on file  Social Connections: Not on file    OB History       Gravida  2   Para  2   Term  1   Preterm  0   AB  0   Living  2        SAB  0   IAB  0   Ectopic  0   Multiple  0   Live Births  2             Medications Ordered Prior to Encounter        Current Outpatient Medications on File Prior to Visit  Medication Sig Dispense Refill   ibuprofen  (ADVIL ) 200 MG tablet Take 400-600 mg by mouth every 8 (eight) hours as needed (pain.).        No current facility-administered medications on file prior to visit.  Allergies      Allergies  Allergen Reactions    Phentermine  Itching   Bactrim [Sulfamethoxazole-Trimethoprim] Rash   Penicillins Rash   Sulfa Antibiotics Rash        Assessment & Plan:  Fibroids, Dysmenorrhea Menorrhagia Thickened endometrial lining  Adenomyosis Preop for myosure D&C, ECC     The hysteroscopy with the myosure D&C, ECC was reviewed and discussed in detail with the patient.  The risk of bleeding, infection, injury to surrounding structures, uterine perforation with risk of additional procedure laparoscopy/laparotomy were all reviewed discussed with patient.  Risk for blood clots was discussed.  Discussed risk for bleeding after the procedure anywhere from 7 to 10 days.  She will need a driver to and from surgery as well.  Pathology results will take about 2 weeks to return.  The risks of surgery were discussed in detail with the patient including but not limited to: bleeding which may require transfusion or reoperation; infection which may require prolonged hospitalization or re-hospitalization and antibiotic therapy; injury to bowel, bladder, ureters and major vessels or other surrounding organs which may lead to other procedures; formation of adhesions; need for additional procedures including laparotomy or subsequent procedures secondary to intraoperative injury or abnormal pathology; thromboembolic phenomenon; incisional problems and other postoperative or anesthesia complications.  The postoperative expectations were also discussed in detail. The patient also understands the alternative treatment options which were discussed in full. All questions were answered.  Patient would like to proceed with the procedure.     She has decided on the [facility] after the  [facility] and would like this scheduled as soon as possible. Case request sent   30 minutes spent on reviewing records, imaging,  and one on one patient time and counseling patient and documentation     Tanya MARLA Carpen         Patient Instructions by  Carpen Tanya MARLA, MD at 12/27/2023 2:15 PM  Author: Carpen Tanya MARLA, MD Author Type: Physician Filed: 12/27/2023  2:35 PM  Note Status: Signed Cosign: Cosign Not Required Encounter Date: 12/27/2023  Editor: Carpen Tanya MARLA, MD (Physician)             Post-procedure Instructions after [facility], ECC Cramping is common.  You may take Ibuprofen , Aleve, or Tylenol  for the cramping.  This should resolve within the next two to three days.   You may have bright red spotting or blackish discharge for several days after your procedure.  The discharge occurs because of a topical solution used to stop bleeding at the biopsy site(s).  The dark discharge will lighten and then turn clear before completely resolving.  You will need to wear a mini pad during this time. SABRA Refrain from putting anything in the vagina until the bleeding and/or discharge COMPLETLEY stops (usually two to three weeks). You need to call the office if you have any pelvic pain, fever, heavy bleeding, dysuria, foul smelling vaginal discharge, or if you are concerned. Showers only for 2 weeks You will be notified within one week of your biopsy results or we will discuss your results at your follow-up appointment if needed.         Instructions  Post-procedure Instructions after myosure D&C, ECC Cramping is common.  You may take Ibuprofen , Aleve, or Tylenol  for the cramping.  This should resolve within the next two to three days.   You may have bright red spotting or blackish discharge for several days after your procedure.  The discharge occurs  because of a topical solution used to stop bleeding at the biopsy site(s).  The dark discharge will lighten and then turn clear before completely resolving.  You will need to wear a mini pad during this time. SABRA Refrain from putting anything in the vagina until the bleeding and/or discharge COMPLETLEY stops (usually two to three weeks). You need to call the office if you have any pelvic  pain, fever, heavy bleeding, dysuria, foul smelling vaginal discharge, or if you are concerned. Showers only for 2 weeks You will be notified within one week of your biopsy results or we will discuss your results at your follow-up appointment if needed.          After Visit Summary  (English Snapshot)  - Automatic Snapshot taken 12/27/2023 Communications  View All Conversations on this Encounter  [facility] Provider CC Chart Rep sent to Jacqulyn KANDICE Ahle, DO Encounter-Level Documents on 12/27/2023:  Document on 12/27/2023 2:53 PM by Glennon Tanya POUR, MD: After Visit Summary Electronic signature on 12/27/2023 12:37 PM - E-signed Electronic signature on 12/27/2023 12:37 PM - Eugenie

## 2023-12-28 NOTE — Transfer of Care (Signed)
 Immediate Anesthesia Transfer of Care Note  Patient: Tanya Sanders  Procedure(s) Performed: DILATATION & CURETTAGE/HYSTEROSCOPY WITH RESECTOCOPE  Patient Location: PACU  Anesthesia Type:General  Level of Consciousness: drowsy and patient cooperative  Airway & Oxygen Therapy: Patient Spontanous Breathing  Post-op Assessment: Report given to RN, Post -op Vital signs reviewed and stable, and Patient moving all extremities X 4  Post vital signs: Reviewed and stable  Last Vitals:  Vitals Value Taken Time  BP See PACU flowsheet, VSS   Temp    Pulse 80 12/28/23 14:41  Resp 13 12/28/23 14:41  SpO2 100 % 12/28/23 14:41  Vitals shown include unfiled device data.  Last Pain:  Vitals:   12/28/23 1229  TempSrc: Oral  PainSc: 0-No pain      Patients Stated Pain Goal: 7 (12/28/23 1229)  Complications: No notable events documented.

## 2023-12-29 ENCOUNTER — Encounter (HOSPITAL_COMMUNITY): Payer: Self-pay | Admitting: Obstetrics and Gynecology

## 2023-12-29 ENCOUNTER — Ambulatory Visit: Payer: Self-pay | Admitting: Obstetrics and Gynecology

## 2023-12-29 LAB — SURGICAL PATHOLOGY

## 2023-12-29 LAB — TYPE AND SCREEN
ABO/RH(D): A POS
Antibody Screen: NEGATIVE

## 2024-01-11 ENCOUNTER — Ambulatory Visit (INDEPENDENT_AMBULATORY_CARE_PROVIDER_SITE_OTHER): Admitting: Obstetrics and Gynecology

## 2024-01-11 VITALS — BP 108/64 | HR 85 | Wt 191.8 lb

## 2024-01-11 DIAGNOSIS — Z9889 Other specified postprocedural states: Secondary | ICD-10-CM

## 2024-01-11 NOTE — H&P (View-Only) (Signed)
 Patient presents for 2 week postop from [facility], ECC She is doing well. No fevers, VB, dysuria or severe abdominal pain. She would like to schedule the [facility] asap  BP 108/64   Pulse 85   Wt 191 lb 12.8 oz (87 kg)   LMP 01/01/2024   SpO2 98%   BMI 31.92 kg/m     A/p PO doing well Resume activities To contact Kate and RNFA to see if we can arrange an earlier date than Oct for the [facility]  Dr. Glennon

## 2024-01-11 NOTE — Progress Notes (Signed)
 Patient presents for 2 week postop from [facility], ECC She is doing well. No fevers, VB, dysuria or severe abdominal pain. She would like to schedule the [facility] asap  BP 108/64   Pulse 85   Wt 191 lb 12.8 oz (87 kg)   LMP 01/01/2024   SpO2 98%   BMI 31.92 kg/m     A/p PO doing well Resume activities To contact Kate and RNFA to see if we can arrange an earlier date than Oct for the [facility]  Dr. Glennon

## 2024-01-20 ENCOUNTER — Encounter: Payer: Self-pay | Admitting: *Deleted

## 2024-02-01 ENCOUNTER — Encounter (HOSPITAL_COMMUNITY): Payer: Self-pay | Admitting: Obstetrics and Gynecology

## 2024-02-01 NOTE — Progress Notes (Signed)
 Spoke w/ via phone for pre-op interview--- pt Lab needs dos----  upt       Lab results------ lab appt 02-06-2024 @ 1300 getting CBC/ T&S/  BMP (gent ordered) COVID test -----patient states asymptomatic no test needed Arrive at -------  0530 on 02-10-2024 NPO after MN w/ exception sips of water  w/ meds Pre-Surgery Ensure or G2:  n/a  Med rec completed Medications to take morning of surgery -----  none Diabetic medication -----  GLP1 agonist last dose:  01-30-2024 GLP1 instructions: pt stated was given instructions by office to stop 7 days prior to surgery  Patient instructed no nail polish to be worn day of surgery Patient instructed to bring photo id and insurance card day of surgery Patient aware to have Driver (ride ) / caregiver    for 24 hours after surgery - husband, Tanya Sanders Patient Special Instructions ----- will pick up soap and written instructions at lab appt Pre-Op special Instructions -----  n/a  Patient verbalized understanding of instructions that were given at this phone interview. Patient denies chest pain, sob, fever, cough at the interview.

## 2024-02-01 NOTE — Pre-Procedure Instructions (Signed)
 Surgical Instructions   Your procedure is scheduled on :  Friday,  02-10-2024. Report to [facility] Main Entrance A at 5:30  A.M., then check in with the Admitting office. Any questions or running late day of surgery: call [phone]  Questions prior to your surgery date: call [phone], Monday-Friday, 8am-4pm. If you experience any cold or flu symptoms such as cough, fever, chills, shortness of breath, etc. between now and your scheduled surgery, please notify surgeon office.    Remember:  Do not eat any food and do not drink any liquids after midnight the night before your surgery.  This includes no water ,  candy,  gum,  and  mints.    Take these medicines the morning of surgery with A SIP OF WATER  :  None   May take these medicines IF NEEDED:  None    One week prior to surgery, STOP taking any Aspirin (unless otherwise instructed by your surgeon) Aleve, Naproxen, Ibuprofen , Motrin , Advil , Goody's, BC's, all herbal medications, fish oil, and non-prescription vitamins.                     Do NOT Smoke (Tobacco/Vaping) and Do Not drink alcohol for 24 hours prior to your procedure.  If you use a CPAP at night, you may bring your mask/headgear for your overnight stay.   You will be asked to remove any contacts, glasses, piercing's, hearing aid's, dentures/partials prior to surgery. Please bring cases/ contact solution/ etc.,  for these items day of surgery.   Patients discharged the day of surgery will not be allowed to drive home, and someone needs to stay with them for 24 hours.  SURGICAL WAITING ROOM VISITATION Patients may have no more than 2 support people in the waiting area - these visitors may rotate.   Pre-op nurse will coordinate an appropriate time for 1 ADULT support person, who may not rotate, to accompany patient in pre-op.  Children under the age of 76 must have an adult with them who is not the patient and must remain in the main waiting area with an  adult.  If the patient needs to stay at the hospital during part of their recovery, the visitor guidelines for inpatient rooms apply.  Please refer to the [facility] website for the visitor guidelines for any additional information.   If you received a COVID test during your pre-op visit  it is requested that you wear a mask when out in public, stay away from anyone that may not be feeling well and notify your surgeon if you develop symptoms. If you have been in contact with anyone that has tested positive in the last 10 days please notify you surgeon.      Pre-operative CHG Bathing Instructions   You can play a key role in reducing the risk of infection after surgery. Your skin needs to be as free of germs as possible. You can reduce the number of germs on your skin by washing with CHG (chlorhexidine  gluconate) soap before surgery. CHG is an antiseptic soap that kills germs and continues to kill germs even after washing.   DO NOT use if you have an allergy to chlorhexidine /CHG or antibacterial soaps. If your skin becomes reddened or irritated, stop using the CHG and notify one of our RN day of surgery.             TAKE A SHOWER THE NIGHT BEFORE SURGERY AND THE DAY OF SURGERY    Please keep in  mind the following:  DO NOT shave, including legs and genital area, 48 hours prior to surgery.   You may shave your face before/day of surgery.  Place clean sheets on your bed the night before surgery Use a clean washcloth (not used since being washed) for each shower. DO NOT sleep with pet's night before surgery.  CHG Shower Instructions:  Wash your face and private area with normal soap. If you choose to wash your hair, wash first with your normal shampoo.  After you use shampoo/soap, rinse your hair and body thoroughly to remove shampoo/soap residue.  Turn the water  OFF and apply half the bottle of CHG soap to a CLEAN washcloth.  Apply CHG soap ONLY FROM YOUR NECK DOWN TO YOUR TOES (washing  for 3-5 minutes)  DO NOT use CHG soap on face, private areas, open wounds, or sores.  Pay special attention to the area where your surgery is being performed.  If you are having back surgery, having someone wash your back for you may be helpful. Wait 2 minutes after CHG soap is applied, then you may rinse off the CHG soap.  Pat dry with a clean towel  Put on clean pajamas    Additional instructions for the day of surgery: DO NOT APPLY any lotions,  powder,  oils,  deodorants (may use underarm deodorant) , cologne/  perfumes  or makeup Do not wear jewelry/  piercing's/  metal/  permanent jewelry must be removed prior arrival day of surgery. (No plastic piercing) Do not wear nail polish, gel polish, artificial nails, or any other type of covering on natural finger nails (toe nails are okay) Do not bring valuables to the hospital. [facility] is not responsible for valuables/personal belongings. Put on clean/comfortable clothes.  Please brush your teeth and rinse mouth out. Ask your nurse before applying any prescription medications to the skin.

## 2024-02-06 ENCOUNTER — Ambulatory Visit: Payer: Self-pay | Admitting: Obstetrics and Gynecology

## 2024-02-06 ENCOUNTER — Encounter (HOSPITAL_COMMUNITY)
Admission: RE | Admit: 2024-02-06 | Discharge: 2024-02-06 | Disposition: A | Discharge status: Home / Self Care | Source: Ambulatory Visit | Attending: Obstetrics and Gynecology | Admitting: Obstetrics and Gynecology

## 2024-02-06 DIAGNOSIS — Z01812 Encounter for preprocedural laboratory examination: Secondary | ICD-10-CM | POA: Insufficient documentation

## 2024-02-06 DIAGNOSIS — Z01818 Encounter for other preprocedural examination: Secondary | ICD-10-CM

## 2024-02-06 LAB — TYPE AND SCREEN
ABO/RH(D): A POS
Antibody Screen: NEGATIVE

## 2024-02-06 LAB — BASIC METABOLIC PANEL WITH GFR
Anion gap: 10 (ref 5–15)
BUN: 12 mg/dL (ref 6–20)
CO2: 25 mmol/L (ref 22–32)
Calcium: 9.2 mg/dL (ref 8.9–10.3)
Chloride: 107 mmol/L (ref 98–111)
Creatinine, Ser: 0.87 mg/dL (ref 0.44–1.00)
GFR, Estimated: >60 mL/min (ref >60)
Glucose, Bld: 88 mg/dL (ref 70–99)
Potassium: 4 mmol/L (ref 3.5–5.1)
Sodium: 142 mmol/L (ref 135–145)

## 2024-02-06 LAB — CBC
HCT: 37.3 % (ref 36.0–46.0)
Hemoglobin: 12.2 g/dL (ref 12.0–15.0)
MCH: 28.2 pg (ref 26.0–34.0)
MCHC: 32.7 g/dL (ref 30.0–36.0)
MCV: 86.3 fL (ref 80.0–100.0)
Platelets: 330 K/uL (ref 150–400)
RBC: 4.32 MIL/uL (ref 3.87–5.11)
RDW: 12.2 % (ref 11.5–15.5)
WBC: 7.5 K/uL (ref 4.0–10.5)
nRBC: 0 % (ref 0.0–0.2)

## 2024-02-09 NOTE — Anesthesia Preprocedure Evaluation (Signed)
 Anesthesia Evaluation  Patient identified by MRN, date of birth, ID band Patient awake    Reviewed: Allergy & Precautions, NPO status , Patient's Chart, lab work & pertinent test results  History of Anesthesia Complications Negative for: history of anesthetic complications  Airway Mallampati: II  TM Distance: >3 FB Neck ROM: Full    Dental no notable dental hx.    Pulmonary former smoker   Pulmonary exam normal breath sounds clear to auscultation       Cardiovascular (-) hypertension(-) angina (-) Past MI negative cardio ROS Normal cardiovascular exam Rhythm:Regular Rate:Normal     Neuro/Psych neg Seizures negative neurological ROS     GI/Hepatic negative GI ROS, Neg liver ROS,neg GERD  ,,  Endo/Other  negative endocrine ROS    Renal/GU negative Renal ROS   Adenomyoma of uterus    Musculoskeletal   Abdominal   Peds  Hematology   Anesthesia Other Findings   Reproductive/Obstetrics                              Anesthesia Physical Anesthesia Plan  ASA: 2  Anesthesia Plan: General   Post-op Pain Management: Tylenol  PO (pre-op)*   Induction:   PONV Risk Score and Plan: 3 and Ondansetron , Dexamethasone , Treatment may vary due to age or medical condition, Midazolam  and Scopolamine  patch - Pre-op  Airway Management Planned: Oral ETT  Additional Equipment: None  Intra-op Plan:   Post-operative Plan: Extubation in OR  Informed Consent: I have reviewed the patients History and Physical, chart, labs and discussed the procedure including the risks, benefits and alternatives for the proposed anesthesia with the patient or authorized representative who has indicated his/her understanding and acceptance.     Dental advisory given  Plan Discussed with: CRNA  Anesthesia Plan Comments:          Anesthesia Quick Evaluation

## 2024-02-10 ENCOUNTER — Other Ambulatory Visit: Payer: Self-pay

## 2024-02-10 ENCOUNTER — Other Ambulatory Visit (HOSPITAL_COMMUNITY): Payer: Self-pay

## 2024-02-10 ENCOUNTER — Ambulatory Visit (HOSPITAL_COMMUNITY)
Admission: RE | Admit: 2024-02-10 | Discharge: 2024-02-10 | Disposition: A | Discharge status: Home / Self Care | Attending: Obstetrics and Gynecology | Admitting: Obstetrics and Gynecology

## 2024-02-10 ENCOUNTER — Encounter (HOSPITAL_COMMUNITY)
Admission: RE | Disposition: A | Discharge status: Home / Self Care | Payer: Self-pay | Source: Home / Self Care | Attending: Obstetrics and Gynecology

## 2024-02-10 ENCOUNTER — Encounter (HOSPITAL_COMMUNITY): Payer: Self-pay | Admitting: Obstetrics and Gynecology

## 2024-02-10 ENCOUNTER — Ambulatory Visit (HOSPITAL_COMMUNITY): Payer: Self-pay

## 2024-02-10 ENCOUNTER — Encounter (HOSPITAL_COMMUNITY): Payer: Self-pay

## 2024-02-10 DIAGNOSIS — D251 Intramural leiomyoma of uterus: Secondary | ICD-10-CM | POA: Diagnosis not present

## 2024-02-10 DIAGNOSIS — Z87891 Personal history of nicotine dependence: Secondary | ICD-10-CM | POA: Diagnosis not present

## 2024-02-10 DIAGNOSIS — N920 Excessive and frequent menstruation with regular cycle: Secondary | ICD-10-CM | POA: Diagnosis not present

## 2024-02-10 DIAGNOSIS — D259 Leiomyoma of uterus, unspecified: Secondary | ICD-10-CM

## 2024-02-10 DIAGNOSIS — N8003 Adenomyosis of the uterus: Secondary | ICD-10-CM

## 2024-02-10 DIAGNOSIS — D219 Benign neoplasm of connective and other soft tissue, unspecified: Secondary | ICD-10-CM | POA: Diagnosis not present

## 2024-02-10 DIAGNOSIS — Z01818 Encounter for other preprocedural examination: Secondary | ICD-10-CM

## 2024-02-10 DIAGNOSIS — N946 Dysmenorrhea, unspecified: Secondary | ICD-10-CM | POA: Diagnosis present

## 2024-02-10 DIAGNOSIS — N72 Inflammatory disease of cervix uteri: Secondary | ICD-10-CM | POA: Insufficient documentation

## 2024-02-10 DIAGNOSIS — N80329 Endometriosis of the posterior cul-de-sac, unspecified depth: Secondary | ICD-10-CM | POA: Diagnosis not present

## 2024-02-10 DIAGNOSIS — N921 Excessive and frequent menstruation with irregular cycle: Secondary | ICD-10-CM | POA: Diagnosis not present

## 2024-02-10 DIAGNOSIS — N8000 Endometriosis of the uterus, unspecified: Secondary | ICD-10-CM | POA: Insufficient documentation

## 2024-02-10 DIAGNOSIS — D649 Anemia, unspecified: Secondary | ICD-10-CM | POA: Diagnosis not present

## 2024-02-10 HISTORY — PX: CYSTOSCOPY: SHX5120

## 2024-02-10 HISTORY — DX: Dysmenorrhea, unspecified: N94.6

## 2024-02-10 HISTORY — DX: Leiomyoma of uterus, unspecified: D25.9

## 2024-02-10 HISTORY — PX: HYSTERECTOMY, TOTAL, LAPAROSCOPIC, ROBOT-ASSISTED WITH SALPINGECTOMY: SHX7587

## 2024-02-10 HISTORY — PX: EXCISION, ENDOMETRIOSIS, ROBOTIC ASSISTED, LAPAROSCOPIC: SHX7564

## 2024-02-10 HISTORY — DX: Anemia, unspecified: D64.9

## 2024-02-10 HISTORY — DX: Other benign neoplasm of uterus, unspecified: D26.9

## 2024-02-10 HISTORY — DX: Excessive and frequent menstruation with irregular cycle: N92.1

## 2024-02-10 LAB — POCT PREGNANCY, URINE: Preg Test, Ur: NEGATIVE

## 2024-02-10 SURGERY — HYSTERECTOMY, TOTAL, LAPAROSCOPIC, ROBOT-ASSISTED WITH SALPINGECTOMY
Anesthesia: General

## 2024-02-10 MED ORDER — BUPIVACAINE HCL (PF) 0.5 % IJ SOLN
INTRAMUSCULAR | Status: AC
Start: 2024-02-10 — End: 2024-02-10
  Filled 2024-02-10: qty 30

## 2024-02-10 MED ORDER — PHENYLEPHRINE 80 MCG/ML (10ML) SYRINGE FOR IV PUSH (FOR BLOOD PRESSURE SUPPORT)
PREFILLED_SYRINGE | INTRAVENOUS | Status: DC | PRN
Start: 2024-02-10 — End: 2024-02-10
  Administered 2024-02-10 (×2): 80 ug via INTRAVENOUS
  Administered 2024-02-10: 40 ug via INTRAVENOUS
  Administered 2024-02-10: 80 ug via INTRAVENOUS
  Administered 2024-02-10: 40 ug via INTRAVENOUS

## 2024-02-10 MED ORDER — DROPERIDOL 2.5 MG/ML IJ SOLN
0.6250 mg | Freq: Once | INTRAMUSCULAR | Status: AC | PRN
  Administered 2024-02-10: 0.625 mg via INTRAVENOUS

## 2024-02-10 MED ORDER — IBUPROFEN 800 MG PO TABS
800.0000 mg | ORAL_TABLET | Freq: Three times a day (TID) | ORAL | 0 refills | Status: DC | PRN
  Filled 2024-02-10: qty 30, 10d supply, fill #0

## 2024-02-10 MED ORDER — CLINDAMYCIN PHOSPHATE 900 MG/50ML IV SOLN
900.0000 mg | INTRAVENOUS | Status: AC
  Administered 2024-02-10: 900 mg via INTRAVENOUS

## 2024-02-10 MED ORDER — SCOPOLAMINE 1 MG/3DAYS TD PT72: 1.0000 | MEDICATED_PATCH | TRANSDERMAL | Status: DC

## 2024-02-10 MED ORDER — ONDANSETRON HCL 4 MG/2ML IJ SOLN
INTRAMUSCULAR | Status: DC | PRN
  Administered 2024-02-10: 4 mg via INTRAVENOUS

## 2024-02-10 MED ORDER — ACETAMINOPHEN 500 MG PO TABS: 1000.0000 mg | ORAL_TABLET | Freq: Once | ORAL | Status: AC

## 2024-02-10 MED ORDER — CHLORHEXIDINE GLUCONATE 0.12 % MT SOLN: 15.0000 mL | Freq: Once | OROMUCOSAL | Status: AC

## 2024-02-10 MED ORDER — ONDANSETRON HCL 4 MG/2ML IJ SOLN
INTRAMUSCULAR | Status: AC
  Filled 2024-02-10: qty 2

## 2024-02-10 MED ORDER — MIDAZOLAM HCL 2 MG/2ML IJ SOLN
INTRAMUSCULAR | Status: DC | PRN
Start: 2024-02-10 — End: 2024-02-10
  Administered 2024-02-10: 2 mg via INTRAVENOUS

## 2024-02-10 MED ORDER — SUGAMMADEX SODIUM 200 MG/2ML IV SOLN
INTRAVENOUS | Status: DC | PRN
  Administered 2024-02-10: 200 mg via INTRAVENOUS

## 2024-02-10 MED ORDER — ACETAMINOPHEN 500 MG PO TABS
ORAL_TABLET | ORAL | Status: AC
  Administered 2024-02-10: 1000 mg via ORAL
  Filled 2024-02-10: qty 2

## 2024-02-10 MED ORDER — 0.9 % SODIUM CHLORIDE (POUR BTL) OPTIME
TOPICAL | Status: DC | PRN
  Administered 2024-02-10: 1000 mL

## 2024-02-10 MED ORDER — ROCURONIUM BROMIDE 10 MG/ML (PF) SYRINGE
PREFILLED_SYRINGE | INTRAVENOUS | Status: AC
Start: 2024-02-10 — End: 2024-02-10
  Filled 2024-02-10: qty 10

## 2024-02-10 MED ORDER — FENTANYL CITRATE (PF) 250 MCG/5ML IJ SOLN
INTRAMUSCULAR | Status: AC
  Filled 2024-02-10: qty 5

## 2024-02-10 MED ORDER — CHLORHEXIDINE GLUCONATE 0.12 % MT SOLN
OROMUCOSAL | Status: AC
  Administered 2024-02-10: 15 mL via OROMUCOSAL
  Filled 2024-02-10: qty 15

## 2024-02-10 MED ORDER — KETOROLAC TROMETHAMINE 30 MG/ML IJ SOLN
INTRAMUSCULAR | Status: AC
  Filled 2024-02-10: qty 1

## 2024-02-10 MED ORDER — ORAL CARE MOUTH RINSE: 15.0000 mL | Freq: Once | OROMUCOSAL | Status: AC

## 2024-02-10 MED ORDER — FENTANYL CITRATE (PF) 100 MCG/2ML IJ SOLN
25.0000 ug | INTRAMUSCULAR | Status: DC | PRN
  Administered 2024-02-10: 25 ug via INTRAVENOUS

## 2024-02-10 MED ORDER — LIDOCAINE 2% (20 MG/ML) 5 ML SYRINGE
INTRAMUSCULAR | Status: AC
  Filled 2024-02-10: qty 5

## 2024-02-10 MED ORDER — PHENYLEPHRINE 80 MCG/ML (10ML) SYRINGE FOR IV PUSH (FOR BLOOD PRESSURE SUPPORT)
PREFILLED_SYRINGE | INTRAVENOUS | Status: AC
Start: 2024-02-10 — End: 2024-02-10
  Filled 2024-02-10: qty 10

## 2024-02-10 MED ORDER — SODIUM CHLORIDE 0.9 % IV SOLN
Freq: Once | INTRAVENOUS | Status: AC
  Administered 2024-02-10: 800 mL
  Filled 2024-02-10: qty 10

## 2024-02-10 MED ORDER — HYDROMORPHONE HCL 1 MG/ML IJ SOLN
INTRAMUSCULAR | Status: AC
  Filled 2024-02-10: qty 0.5

## 2024-02-10 MED ORDER — PROPOFOL 10 MG/ML IV BOLUS
INTRAVENOUS | Status: AC
  Filled 2024-02-10: qty 20

## 2024-02-10 MED ORDER — PHENYLEPHRINE 80 MCG/ML (10ML) SYRINGE FOR IV PUSH (FOR BLOOD PRESSURE SUPPORT)
PREFILLED_SYRINGE | INTRAVENOUS | Status: AC
  Filled 2024-02-10: qty 10

## 2024-02-10 MED ORDER — METOCLOPRAMIDE HCL 10 MG PO TABS
10.0000 mg | ORAL_TABLET | Freq: Four times a day (QID) | ORAL | 0 refills | Status: DC
  Filled 2024-02-10: qty 10, 3d supply, fill #0

## 2024-02-10 MED ORDER — GENTAMICIN SULFATE 40 MG/ML IJ SOLN
5.0000 mg/kg | INTRAVENOUS | Status: AC
  Administered 2024-02-10: 431.2 mg via INTRAVENOUS
  Filled 2024-02-10: qty 10.75

## 2024-02-10 MED ORDER — DEXAMETHASONE SODIUM PHOSPHATE 10 MG/ML IJ SOLN
INTRAMUSCULAR | Status: DC | PRN
Start: 2024-02-10 — End: 2024-02-10
  Administered 2024-02-10: 10 mg via INTRAVENOUS

## 2024-02-10 MED ORDER — SCOPOLAMINE 1 MG/3DAYS TD PT72
MEDICATED_PATCH | TRANSDERMAL | Status: AC
  Filled 2024-02-10: qty 1

## 2024-02-10 MED ORDER — DEXAMETHASONE SODIUM PHOSPHATE 10 MG/ML IJ SOLN
INTRAMUSCULAR | Status: AC
Start: 2024-02-10 — End: 2024-02-10
  Filled 2024-02-10: qty 1

## 2024-02-10 MED ORDER — OXYCODONE HCL 5 MG PO TABS
5.0000 mg | ORAL_TABLET | ORAL | 0 refills | Status: DC | PRN
  Filled 2024-02-10: qty 8, 2d supply, fill #0

## 2024-02-10 MED ORDER — ROCURONIUM BROMIDE 10 MG/ML (PF) SYRINGE
PREFILLED_SYRINGE | INTRAVENOUS | Status: DC | PRN
  Administered 2024-02-10 (×2): 5 mg via INTRAVENOUS
  Administered 2024-02-10: 60 mg via INTRAVENOUS

## 2024-02-10 MED ORDER — HYDROMORPHONE HCL 1 MG/ML IJ SOLN
INTRAMUSCULAR | Status: DC | PRN
  Administered 2024-02-10: .5 mg via INTRAVENOUS

## 2024-02-10 MED ORDER — DROPERIDOL 2.5 MG/ML IJ SOLN
INTRAMUSCULAR | Status: AC
  Filled 2024-02-10: qty 2

## 2024-02-10 MED ORDER — MIDAZOLAM HCL 2 MG/2ML IJ SOLN
INTRAMUSCULAR | Status: AC
  Filled 2024-02-10: qty 2

## 2024-02-10 MED ORDER — CLINDAMYCIN PHOSPHATE 900 MG/50ML IV SOLN
INTRAVENOUS | Status: AC
  Filled 2024-02-10: qty 50

## 2024-02-10 MED ORDER — BUPIVACAINE HCL (PF) 0.5 % IJ SOLN
INTRAMUSCULAR | Status: DC | PRN
  Administered 2024-02-10: 20 mL

## 2024-02-10 MED ORDER — FENTANYL CITRATE (PF) 100 MCG/2ML IJ SOLN
INTRAMUSCULAR | Status: AC
  Filled 2024-02-10: qty 2

## 2024-02-10 MED ORDER — PROPOFOL 10 MG/ML IV BOLUS
INTRAVENOUS | Status: DC | PRN
Start: 2024-02-10 — End: 2024-02-10
  Administered 2024-02-10: 160 mg via INTRAVENOUS

## 2024-02-10 MED ORDER — POVIDONE-IODINE 10 % EX SWAB: 2.0000 | Freq: Once | CUTANEOUS | Status: DC

## 2024-02-10 MED ORDER — ACETAMINOPHEN 10 MG/ML IV SOLN: 1000.0000 mg | Freq: Once | INTRAVENOUS | Status: DC | PRN

## 2024-02-10 MED ORDER — OXYCODONE HCL 5 MG/5ML PO SOLN: 5.0000 mg | Freq: Once | ORAL | Status: DC | PRN

## 2024-02-10 MED ORDER — LACTATED RINGERS IV SOLN: INTRAVENOUS | Status: DC

## 2024-02-10 MED ORDER — LACTATED RINGERS IV SOLN
INTRAVENOUS | Status: DC
Start: 2024-02-10 — End: 2024-02-10

## 2024-02-10 MED ORDER — LIDOCAINE 2% (20 MG/ML) 5 ML SYRINGE
INTRAMUSCULAR | Status: DC | PRN
Start: 2024-02-10 — End: 2024-02-10
  Administered 2024-02-10 (×2): 100 mg via INTRAVENOUS

## 2024-02-10 MED ORDER — OXYCODONE HCL 5 MG PO TABS: 5.0000 mg | ORAL_TABLET | Freq: Once | ORAL | Status: DC | PRN

## 2024-02-10 MED ORDER — FENTANYL CITRATE (PF) 250 MCG/5ML IJ SOLN
INTRAMUSCULAR | Status: DC | PRN
  Administered 2024-02-10: 100 ug via INTRAVENOUS
  Administered 2024-02-10: 50 ug via INTRAVENOUS

## 2024-02-10 MED ORDER — KETOROLAC TROMETHAMINE 30 MG/ML IJ SOLN
INTRAMUSCULAR | Status: DC | PRN
  Administered 2024-02-10: 30 mg via INTRAVENOUS

## 2024-02-10 MED ORDER — ACETAMINOPHEN 500 MG PO TABS: 1000.0000 mg | ORAL_TABLET | ORAL | Status: DC

## 2024-02-10 SURGICAL SUPPLY — 42 items
COVER BACK TABLE 60X90IN (DRAPES) ×2 IMPLANT
COVER TIP SHEARS 8 DVNC (MISCELLANEOUS) ×2 IMPLANT
DEFOGGER SCOPE WARM SEASHARP (MISCELLANEOUS) ×2 IMPLANT
DERMABOND ADVANCED .7 DNX12 (GAUZE/BANDAGES/DRESSINGS) ×2 IMPLANT
DRAPE ARM DVNC X/XI (DISPOSABLE) ×8 IMPLANT
DRAPE COLUMN DVNC XI (DISPOSABLE) ×2 IMPLANT
DRAPE SURG IRRIG POUCH 19X23 (DRAPES) ×2 IMPLANT
DRAPE UTILITY XL STRL (DRAPES) ×2 IMPLANT
DRIVER NDL MEGA SUTCUT DVNCXI (INSTRUMENTS) ×2 IMPLANT
DRIVER NDLE MEGA SUTCUT DVNCXI (INSTRUMENTS) ×2 IMPLANT
DURAPREP 26ML APPLICATOR (WOUND CARE) ×2 IMPLANT
ELECTRODE REM PT RTRN 9FT ADLT (ELECTROSURGICAL) ×2 IMPLANT
FORCEPS PROGRASP DVNC XI (FORCEP) ×2 IMPLANT
GLOVE BIO SURGEON STRL SZ7 (GLOVE) IMPLANT
GLOVE BIOGEL PI IND STRL 7.0 (GLOVE) ×4 IMPLANT
GLOVE NEODERM STER SZ 7 (GLOVE) ×6 IMPLANT
GOWN STRL REUS W/ TWL LRG LVL3 (GOWN DISPOSABLE) ×2 IMPLANT
GOWN STRL SURGICAL XL XLNG (GOWN DISPOSABLE) IMPLANT
IRRIGATION SUCT STRKRFLW 2 WTP (MISCELLANEOUS) ×2 IMPLANT
KIT PINK PAD W/HEAD ARM REST (MISCELLANEOUS) ×2 IMPLANT
KIT TURNOVER KIT B (KITS) ×2 IMPLANT
LEGGING LITHOTOMY PAIR STRL (DRAPES) ×2 IMPLANT
MANIFOLD NEPTUNE II (INSTRUMENTS) ×2 IMPLANT
NS IRRIG 1000ML POUR BTL (IV SOLUTION) ×2 IMPLANT
OBTURATOR OPTICALSTD 8 DVNC (TROCAR) ×2 IMPLANT
OCCLUDER COLPOPNEUMO (BALLOONS) IMPLANT
PACK ROBOT WH (CUSTOM PROCEDURE TRAY) ×2 IMPLANT
PACK ROBOTIC GOWN (GOWN DISPOSABLE) ×2 IMPLANT
PAD OB MATERNITY 11 LF (PERSONAL CARE ITEMS) ×2 IMPLANT
SCALPEL HRMNC RUM II 3.0 SILVR (DISPOSABLE) IMPLANT
SCISSORS MNPLR CVD DVNC XI (INSTRUMENTS) ×2 IMPLANT
SEAL UNIV 5-12 XI (MISCELLANEOUS) ×6 IMPLANT
SEALER VESSEL EXT DVNC XI (MISCELLANEOUS) IMPLANT
SET CYSTO W/LG BORE CLAMP LF (SET/KITS/TRAYS/PACK) ×2 IMPLANT
SET TUBE SMOKE EVAC HIGH FLOW (TUBING) ×2 IMPLANT
SPIKE FLUID TRANSFER (MISCELLANEOUS) ×2 IMPLANT
SUT MNCRL AB 4-0 PS2 18 (SUTURE) ×2 IMPLANT
SUT VLOC 180 0 9IN GS21 (SUTURE) ×4 IMPLANT
TIP UTERINE 6.7X8CM BLUE DISP (MISCELLANEOUS) IMPLANT
TOWEL GREEN STERILE (TOWEL DISPOSABLE) ×2 IMPLANT
TRAY FOLEY W/BAG SLVR 14FR (SET/KITS/TRAYS/PACK) ×2 IMPLANT
UNDERPAD 30X36 HEAVY ABSORB (UNDERPADS AND DIAPERS) ×2 IMPLANT

## 2024-02-10 NOTE — Anesthesia Procedure Notes (Signed)
 Procedure Name: Intubation Date/Time: 02/10/2024 7:36 AM  Performed by: Viviana Almarie DASEN, CRNAPre-anesthesia Checklist: Patient identified, Emergency Drugs available, Suction available and Patient being monitored Patient Re-evaluated:Patient Re-evaluated prior to induction Oxygen Delivery Method: Circle System Utilized Preoxygenation: Pre-oxygenation with 100% oxygen Induction Type: IV induction Ventilation: Mask ventilation without difficulty Grade View: Grade I Tube type: Oral Tube size: 7.0 mm Number of attempts: 1 Airway Equipment and Method: Stylet and Bite block Placement Confirmation: ETT inserted through vocal cords under direct vision, positive ETCO2 and breath sounds checked- equal and bilateral Secured at: 20 cm Tube secured with: Tape Dental Injury: Teeth and Oropharynx as per pre-operative assessment

## 2024-02-10 NOTE — Interval H&P Note (Signed)
 History and Physical Interval Note:  02/10/2024 7:08 AM  Tanya Sanders  has presented today for surgery, with the diagnosis of dysmenorrhea, Hx of abnormal cervical pap smear, memorrhagia with irregular cycle.  The various methods of treatment have been discussed with the patient and family. After consideration of risks, benefits and other options for treatment, the patient has consented to  Procedure(s): HYSTERECTOMY, TOTAL, LAPAROSCOPIC, ROBOT-ASSISTED WITH BILATERAL SALPINGECTOMY (N/A) CYSTOSCOPY (N/A) as a surgical intervention.  The patient's history has been reviewed, patient examined, no change in status, stable for surgery.  I have reviewed the patient's chart and labs.  Questions were answered to the patient's satisfaction.     [name]

## 2024-02-10 NOTE — Op Note (Signed)
 02/10/2024  [id number] Tanya Sanders        OPERATIVE REPORT   Preop Diagnosis: menorrhagia, dysmenorrhea, anemia, fibroids  Procedure: robotic hysterectomy, bilateral salpingectomy, excision of endometriosis, cystoscopy   Surgeon: Dr. Almarie Rollo Carpen Assistant: Dr. Vera Pa Circulator: [place], Hadassah Buel RAMAN, RN Relief Scrub: Falcon, Racheal T Scrub Person: Clarisse, Copy A; Little, Maggie, CST    Fluids: please see anesthesia report   Complications: None Anesthesia: General     Findings: Fibroid 8cm uterus, normal ovaries and tubes, stage 1-2 endometriosis with lesions on the posterior peritoneum that were removed. Cystoscopy at the end of the case with normal bladder and patent ureters bilaterally.   Estimated blood loss: Minimal <15cc   Specimens: Uterus, cervix and bilateral tubes, posterior peritoneum   Disposition of specimen: Pathology           Patient is taken to the operating room. She is placed in the supine position. She is a running IV in place. Informed consent was present on the chart. SCDs on her lower extremities and functioning properly. Patient was positioned while she was awake.  Her legs were placed in the low lithotomy position in [place] stirrups. Her arms were tucked by the side.  General endotracheal anesthesia was administered by the anesthesia staff without difficulty.       Dura prep was then used to prep the abdomen and Hibiclens  was used to prep the inner thighs, perineum and vagina. Once 3 minutes had past the patient was draped in a normal standard fashion. A proper time out was performed and everyone agreed.  The legs were lifted to the high lithotomy position. A bivalve speculum was inserted into the vagina and the anterior lip of the cervix was grasped with single-tooth tenaculum.  The uterus sounded to 8 cm. Pratt dilators were used to dilate the cervix.  The RUMI uterine manipulator was obtained inserted into the  endometrial cavity and the bulb of the disposable tip was inflated with 8 cc of normal saline. There was a good fit of the KOH ring around the cervix. The tenaculum and bivavle speculum was removed. There is also good manipulation of the uterus.  A Foley catheter was placed to straight drain.  Clear urine was noted. Legs were lowered to the low lithotomy position and attention was turned the abdomen.   Superior to the umbilicus, marcaine  0.25% used to anesthetize the skin.  Using #11 blade, 8mm skin incision was made.  The 8mm robotic trocar and sleeve was inserted under direct visualization.  CO2 gas was  started and patient was placed in trendelenburg position.  Two additional 8mm ports were placed under direct visualization in the left and right lower quadrant.     Ureters were identifies.  Attention was turned to the left side. The left tube was elevated and the mesosalpinx was desiccated with the vessel sealer.  The left uterine ovarian pedicle was serially clamped cauterized and incised. Left round ligament was serially clamped cauterized and incised. The anterior and posterior peritoneum of the inferior leaf of the broad ligament were opened. The beginning of the bladder flap was created.  The bladder was taken down below the level of the KOH ring. The left uterine artery skeletonized and then just superior to the KOH ring this vessel was serially clamped, cauterized, and incised.   Attention was turned the right side.  The uterus was placed on stretch to the opposite side.    The mesosalpinx was incised freeing the  tube. Then the right uterine ovarian pedicle was serially clamped cauterized and incised. Next the right round ligament was serially clamped cauterized and incised. The anterior posterior peritoneum of the inferiorly for the broad ligament were opened. The anterior peritoneum was carried across to the dissection on the left side. The remainder of the bladder flap was created using sharp  dissection. The bladder was well below the level of the KOH ring. The right uterine artery skeletonized. Then the right uterine artery, above the level of the KOH ring, was serially clamped cauterized and incised. The uterus was devascularized at this point.   The colpotomy was performed.  This was carried around a circumferential fashion until the vaginal mucosa was completely incised in the specimen was freed.  The specimen was then delivered to the vagina intact.  A vaginal occlusive device was used to maintain the pneumoperitoneum   Instruments were changed with a needle driver and prograsp.  Using a 9 inch  zero V-lock suture, the cuff was closed by incorporating the anterior and posterior vaginal mucosa in each stitch. This was carried across all the way to the left corner and a running fashion. Two stitches were brought back towards the midline and the suture was cut flush with the vagina. A third stitch with zero v-lock was used to reinforce the cuff and incorporate the anterior peritoneum over the entire cuff. The posterior peritoneum that contained the endomeriosis lesions was removed and sent to pathology.  Good hemostasis was assured. All needles were brought out the pelvis. The pelvis was irrigated. All pedicles were inspected. No bleeding was noted.   Co2 pressures were lowered to 8mm Hg.  Again, no bleeding was noted.  Ureters were noted deep in the pelvis to be peristalsing.  At this point the procedure was completed.  The remaining instruments were removed.  The ports were removed under direct visualization of the laparoscope and the pneumoperitoneum was relieved.   The skin was then closed with subcuticular stitches of 3-0 Vicryl. The skin was cleansed Dermabond was applied. Attention was then turned the vagina and the cuff was inspected. No bleeding was noted.  The Foley catheter was removed.  Cystoscopy was performed.  No sutures or bladder injuries were noted.  Ureters were noted with  normal urine jets from each one was seen.  Foley was left out after the cystoscopic fluid was drained and cystoscope removed.  Sponge, lap, needle, instrument counts were correct x2. Patient tolerated the procedure very well. She was awakened from anesthesia, extubated and taken to recovery in stable condition.      Dr. Glennon

## 2024-02-10 NOTE — Transfer of Care (Signed)
 Immediate Anesthesia Transfer of Care Note  Patient: Tanya Sanders  Procedure(s) Performed: HYSTERECTOMY, TOTAL, LAPAROSCOPIC, ROBOT-ASSISTED WITH BILATERAL SALPINGECTOMY CYSTOSCOPY EXCISION, ENDOMETRIOSIS, ROBOTIC ASSISTED, LAPAROSCOPIC  Patient Location: PACU  Anesthesia Type:General  Level of Consciousness: awake, alert , oriented, and patient cooperative  Airway & Oxygen Therapy: Patient Spontanous Breathing  Post-op Assessment: Report given to RN, Post -op Vital signs reviewed and stable, Patient moving all extremities X 4, and Patient able to stick tongue midline  Post vital signs: Reviewed and stable  Last Vitals:  Vitals Value Taken Time  BP 99/69 02/10/24 09:11  Temp 36.5 C 02/10/24 09:11  Pulse 75 02/10/24 09:12  Resp 21 02/10/24 09:12  SpO2 99 % 02/10/24 09:12  Vitals shown include unfiled device data.  Last Pain:  Vitals:   02/10/24 0623  TempSrc: Oral  PainSc: 0-No pain      Patients Stated Pain Goal: 5 (02/10/24 9376)  Complications: No notable events documented.

## 2024-02-10 NOTE — Discharge Instructions (Signed)
  Post Anesthesia Home Care Instructions  Activity: Get plenty of rest for the remainder of the day. A responsible individual must stay with you for 24 hours following the procedure.  For the next 24 hours, DO NOT: -Drive a car -Advertising copywriter -Drink alcoholic beverages -Take any medication unless instructed by your physician -Make any legal decisions or sign important papers.  Meals: Start with liquid foods such as gelatin or soup. Progress to regular foods as tolerated. Avoid greasy, spicy, heavy foods. If nausea and/or vomiting occur, drink only clear liquids until the nausea and/or vomiting subsides. Call your physician if vomiting continues.  Special Instructions/Symptoms: Your throat may feel dry or sore from the anesthesia or the breathing tube placed in your throat during surgery. If this causes discomfort, gargle with warm salt water . The discomfort should disappear within 24 hours.  If you had a scopolamine  patch placed behind your ear for the management of post- operative nausea and/or vomiting:  1. The medication in the patch is effective for 72 hours, after which it should be removed.  Wrap patch in a tissue and discard in the trash. Wash hands thoroughly with soap and water . 2. You may remove the patch earlier than 72 hours if you experience unpleasant side effects which may include dry mouth, dizziness or visual disturbances. 3. Avoid touching the patch. Wash your hands with soap and water  after contact with the patch.       No acetaminophen /Tylenol  until after 12:30 pm today if needed.

## 2024-02-10 NOTE — Anesthesia Postprocedure Evaluation (Signed)
 Anesthesia Post Note  Patient: Tanya Sanders  Procedure(s) Performed: HYSTERECTOMY, TOTAL, LAPAROSCOPIC, ROBOT-ASSISTED WITH BILATERAL SALPINGECTOMY CYSTOSCOPY EXCISION, ENDOMETRIOSIS, ROBOTIC ASSISTED, LAPAROSCOPIC     Patient location during evaluation: PACU Anesthesia Type: General Level of consciousness: awake and alert Pain management: pain level controlled Vital Signs Assessment: post-procedure vital signs reviewed and stable Respiratory status: spontaneous breathing, nonlabored ventilation, respiratory function stable and patient connected to nasal cannula oxygen Cardiovascular status: blood pressure returned to baseline and stable Postop Assessment: no apparent nausea or vomiting Anesthetic complications: no   No notable events documented.  Last Vitals:  Vitals:   02/10/24 1009 02/10/24 1015  BP: 94/62   Pulse: 66   Resp:  14  Temp:  36.6 C  SpO2: 98%     Last Pain:  Vitals:   02/10/24 1015  TempSrc:   PainSc: 1                  Thom JONELLE Peoples

## 2024-02-11 ENCOUNTER — Encounter (HOSPITAL_COMMUNITY): Payer: Self-pay | Admitting: Obstetrics and Gynecology

## 2024-02-13 ENCOUNTER — Ambulatory Visit: Payer: Self-pay | Admitting: Obstetrics and Gynecology

## 2024-02-13 ENCOUNTER — Encounter: Payer: Self-pay | Admitting: Obstetrics and Gynecology

## 2024-02-13 LAB — SURGICAL PATHOLOGY

## 2024-02-15 ENCOUNTER — Encounter: Admitting: Obstetrics and Gynecology

## 2024-02-16 NOTE — H&P (Signed)
 Preop H&P for [facility], bilateral salpingectomy, cystoscopy   Subjective:    Subjective Patient ID: Tanya Sanders, female    DOB: Oct 04, 1985, 38 y.o.   MRN: [id number]     Gynecologic Exam    38 y.o. presents today for Gynecologic Exam (Pre opt visit) . LGSIL on last pap smear Colposcopy with CIN1 Husband with vasectomy Also mentioned today she is dealing with heavy painful periods She is using 2 ppd and bleeds for 5-7 days She is bleeding so heavily that she cannot use a tampon until CD4 otherwise she would soak one every 30 minutes She is not interested in having more children All her family members had hysterectomies in their 5's  She has completed the [facility] and would like a definitive management for the bleeding and pain.     Patient's last menstrual period was 12/01/2023. Period Cycle (Days): 28 Period Duration (Days): 5-7 Period Pattern: Regular Menstrual Flow: Heavy (extra heavy) Menstrual Control: Maxi pad Dysmenorrhea: (!) Severe Dysmenorrhea Symptoms: Cramping, Nausea, Diarrhea, Headache   Review of Systems       Objective:      Objective OBGyn Exam   BP 108/70   Pulse 86   Wt 195 lb 3.2 oz (88.5 kg)   LMP 12/01/2023   SpO2 99%   BMI 32.48 kg/m     Wt Readings from Last 3 Encounters:  12/27/23 195 lb 3.2 oz (88.5 kg)  10/14/23 197 lb (89.4 kg)  02/22/23 182 lb (82.6 kg)    10.19cm uterus Endometrial lining 15.60mm, which is thickened. Normal ovaries 5 fibroids seen: 2.06cm, 0.83cm, 0.70cm, 1.48cm, 0.81cm       01/14/22 PUS Narrative & Impression  T/V images.  Anteverted uterus normal size and shape with a small fundal subserosal fibroid measured at 2 cm x 1.4 cm. Slightly it 0 generous myometrium with streaky shadowing suggestive of mild adenomyosis.  The uterus is measured at 8.55 x 5.55 x 4.64 cm.  The endometrial lining is tri-layered, symmetrical, measured at 5.54 mm with no obvious mass or thickening.  Both ovaries are mobile, mildly  enlarged, with a multi follicular pattern and normal perfusion.        Past Medical History:  Diagnosis Date   Abnormal Pap smear of cervix      lgsil   Abscess of tonsil     H/O breast implant      silicone 09/2022             Past Surgical History:  Procedure Laterality Date   CESAREAN SECTION       CESAREAN SECTION N/A 01/31/2014    Procedure: Repeat CESAREAN SECTION;  Surgeon: Percilla Burly, MD;  Location: [facility];  Service: Obstetrics;  Laterality: N/A;  EDD: 02/06/14   CHOLECYSTECTOMY N/A 09/22/2012    Procedure: LAPAROSCOPIC CHOLECYSTECTOMY;  Surgeon: Elsie GORMAN Holland, MD;  Location: [facility];  Service: General;  Laterality: N/A;   ERCP N/A 10/06/2012    Procedure: ENDOSCOPIC RETROGRADE CHOLANGIOPANCREATOGRAPHY (ERCP);  Surgeon: Lamar CHRISTELLA Hollingshead, MD;  Location: [facility];  Service: Gastroenterology;  Laterality: N/A;   EYE SURGERY   09/19/2020   LASIK Bilateral      08/2020   REMOVAL OF STONES N/A 10/06/2012    Procedure: REMOVAL OF STONES;  Surgeon: Lamar CHRISTELLA Hollingshead, MD;  Location: [facility];  Service: Gastroenterology;  Laterality: N/A;   SPHINCTEROTOMY N/A 10/06/2012    Procedure: SPHINCTEROTOMY;  Surgeon: Lamar CHRISTELLA Hollingshead, MD;  Location: [facility];  Service: Gastroenterology;  Laterality:  N/A;   TONSILLECTOMY N/A 06/03/2016    Procedure: TONSILLECTOMY;  Surgeon: Ida Loader, MD;  Location: [facility];  Service: ENT;  Laterality: N/A;        Social History         Socioeconomic History   Marital status: Married      Spouse name: Not on file   Number of children: Not on file   Years of education: Not on file   Highest education level: Not on file  Occupational History   Occupation: Charity fundraiser      Employer: [employer]  Tobacco Use   Smoking status: Former      Current packs/day: 0.00      Average packs/day: 1 pack/day for 11.0 years (11.0 ttl pk-yrs)      Types: Cigarettes      Start date: 10/26/2001      Quit date: 08/22/2012      Years since quitting: 11.3   Smokeless  tobacco: Never  Vaping Use   Vaping status: Never Used  Substance and Sexual Activity   Alcohol use: Yes      Comment: 1 a week   Drug use: No   Sexual activity: Yes      Partners: Male      Birth control/protection: Other-see comments      Comment: 1st intercourse- 16, partners- more than 5, husband vasectomy  Other Topics Concern   Not on file  Social History Narrative   Not on file    Social Drivers of Health    Financial Resource Strain: Not on file  Food Insecurity: Not on file  Transportation Needs: Not on file  Physical Activity: Not on file  Stress: Not on file  Social Connections: Not on file    OB History       Gravida  2   Para  2   Term  1   Preterm  0   AB  0   Living  2        SAB  0   IAB  0   Ectopic  0   Multiple  0   Live Births  2             Medications Ordered Prior to Encounter        Current Outpatient Medications on File Prior to Visit  Medication Sig Dispense Refill   ibuprofen  (ADVIL ) 200 MG tablet Take 400-600 mg by mouth every 8 (eight) hours as needed (pain.).        No current facility-administered medications on file prior to visit.      Allergies      Allergies  Allergen Reactions   Phentermine  Itching   Bactrim [Sulfamethoxazole-Trimethoprim] Rash   Penicillins Rash   Sulfa Antibiotics Rash       S1S2 CTA B/L Soft, NT No c/c/e  Assessment & Plan:  Fibroids, Dysmenorrhea Menorrhagia Thickened endometrial lining  Adenomyosis Preop      Symptomatic fibroid uterus:  Counseled on all options.  She would like to have the [facility].  Counseled extensively on the procedure including but not limited to what to expect and risks and benefits.  Counseled on postop care and pelvic rest for 10 weeks after the surgery with restricted lifting for 6 weeks after.  Counseled on the benefits of the robotic procedure with faster return to daily activities, improved outcomes, and less risk for complications.   The risks of  surgery were discussed in detail with the patient including  but not limited to: bleeding which may require transfusion or reoperation; infection which may require prolonged hospitalization or re-hospitalization and antibiotic therapy; injury to bowel, bladder, ureters and major vessels or other surrounding organs which may lead to other procedures; formation of adhesions; need for additional procedures including laparotomy or subsequent procedures secondary to intraoperative injury or abnormal pathology; thromboembolic phenomenon; incisional problems and other postoperative or anesthesia complications.  The postoperative expectations were also discussed in detail. The patient also understands the alternative treatment options which were discussed in full. All questions were answered.  Patient would like to proceed with the procedure.     30 minutes spent on reviewing records, imaging,  and one on one patient time and counseling patient and documentation     [name]      Patient Instructions by Carpen Almarie MARLA, MD at  After Visit Summary  (English Snapshot)  - Automatic Snapshot taken 12/27/2023

## 2024-02-21 ENCOUNTER — Telehealth: Payer: Self-pay | Admitting: *Deleted

## 2024-02-21 NOTE — Telephone Encounter (Signed)
 Patient updated and advised of change to just post op visit on Thursday. Patient agreeable.   Encounter closed.

## 2024-02-21 NOTE — Telephone Encounter (Signed)
-----   Message from Tanya Sanders sent at 02/21/2024  8:54 AM EDT ----- She just had a hysterectomy.  She does not need annual at this time. Just her post op. Scheduled I believe this Friday for annual Dr. Carpen

## 2024-02-23 ENCOUNTER — Encounter: Payer: Self-pay | Admitting: Obstetrics and Gynecology

## 2024-02-23 ENCOUNTER — Ambulatory Visit (INDEPENDENT_AMBULATORY_CARE_PROVIDER_SITE_OTHER): Payer: Managed Care, Other (non HMO) | Admitting: Obstetrics and Gynecology

## 2024-02-23 VITALS — BP 100/64 | HR 82 | Wt 187.2 lb

## 2024-02-23 DIAGNOSIS — Z09 Encounter for follow-up examination after completed treatment for conditions other than malignant neoplasm: Secondary | ICD-10-CM

## 2024-02-23 NOTE — Progress Notes (Signed)
 Patient presents for 2 week postop from [facility], bilateral salpingectomy, cystoscopy. She is doing well. No fevers, VB, dysuria or severe abdominal pain.  BP 100/64   Pulse 82   Wt 187 lb 3.2 oz (84.9 kg)   LMP 01/01/2024 (Exact Date)   SpO2 100%   BMI 30.21 kg/m   Abdomen: incisions I/c/d, NT, ND  A/p PO from [facility] 2 weeks doing well Encouraged no heavy lifting, pushing, pulling greater than 10 lbs for full 8 weeks 2. Pelvic rest for the entire 10 wks 3. RTC with any concerns or with heavy bleeding, fevers or severe abdominal pain.  Dr. Glennon

## 2024-03-22 ENCOUNTER — Ambulatory Visit: Admitting: Obstetrics and Gynecology

## 2024-03-22 ENCOUNTER — Ambulatory Visit: Payer: Self-pay | Admitting: Obstetrics and Gynecology

## 2024-03-22 ENCOUNTER — Other Ambulatory Visit: Payer: Self-pay

## 2024-03-22 ENCOUNTER — Encounter: Payer: Self-pay | Admitting: Obstetrics and Gynecology

## 2024-03-22 ENCOUNTER — Other Ambulatory Visit (HOSPITAL_COMMUNITY): Payer: Self-pay

## 2024-03-22 VITALS — BP 110/78 | HR 83 | Wt 187.0 lb

## 2024-03-22 DIAGNOSIS — N898 Other specified noninflammatory disorders of vagina: Secondary | ICD-10-CM | POA: Diagnosis not present

## 2024-03-22 DIAGNOSIS — Z09 Encounter for follow-up examination after completed treatment for conditions other than malignant neoplasm: Secondary | ICD-10-CM

## 2024-03-22 LAB — WET PREP FOR TRICH, YEAST, CLUE

## 2024-03-22 MED ORDER — METRONIDAZOLE 500 MG PO TABS
500.0000 mg | ORAL_TABLET | Freq: Two times a day (BID) | ORAL | 0 refills | Status: DC
  Filled 2024-03-22 – 2024-03-23 (×2): qty 14, 7d supply, fill #0

## 2024-03-22 MED ORDER — FLUCONAZOLE 150 MG PO TABS
150.0000 mg | ORAL_TABLET | Freq: Once | ORAL | 0 refills | Status: AC
  Filled 2024-03-22 – 2024-03-23 (×2): qty 2, 2d supply, fill #0

## 2024-03-22 NOTE — Progress Notes (Signed)
 Patient presents for 10 week postop from [facility], bilateral salpingectomy, cystoscopy. She is doing well. No fevers, VB, dysuria or severe abdominal pain.  BP 110/78   Pulse 83   Wt 187 lb (84.8 kg)   LMP 01/01/2024 (Exact Date)   SpO2 98%   BMI 30.18 kg/m   SVE: sutures seen, possible bv dischrage, no VB, good vaginal support, no bleeding  A/p PO from [facility] 6 weeks doing well Resume activities 2. Pelvic rest until cleared.  Counseled on risks of early intercourse. RTC in 4 weeks for repeat exam 3. Encouraged annual mammograms and resume annual care with PCP  Dr. Glennon

## 2024-03-23 ENCOUNTER — Other Ambulatory Visit (HOSPITAL_BASED_OUTPATIENT_CLINIC_OR_DEPARTMENT_OTHER): Payer: Self-pay

## 2024-03-23 ENCOUNTER — Other Ambulatory Visit: Payer: Self-pay

## 2024-03-23 ENCOUNTER — Other Ambulatory Visit (HOSPITAL_COMMUNITY): Payer: Self-pay

## 2024-04-09 ENCOUNTER — Other Ambulatory Visit (HOSPITAL_COMMUNITY): Payer: Self-pay

## 2024-04-09 ENCOUNTER — Telehealth: Admitting: Physician Assistant

## 2024-04-09 DIAGNOSIS — K649 Unspecified hemorrhoids: Secondary | ICD-10-CM | POA: Diagnosis not present

## 2024-04-09 MED ORDER — HYDROCORTISONE 2.5 % EX CREA
TOPICAL_CREAM | Freq: Two times a day (BID) | CUTANEOUS | 0 refills | Status: AC
  Filled 2024-04-09: qty 30, 30d supply, fill #0

## 2024-04-09 NOTE — Progress Notes (Signed)
E-Visit for Hemorrhoid  We are sorry that you are not feeling well. We are here to help!  Hemorrhoids are swollen veins in the rectum. They can cause itching, bleeding, and pain. Hemorrhoids are very common.  In some cases, you can see or feel hemorrhoids around the outside of the rectum. In other cases, you cannot see them because they are hidden inside the rectum. Be patient - It can take months for this to improve or go away.   Hemorrhoids do not always cause symptoms. But when they do, symptoms can include: ?Itching of the skin around the anus ?Bleeding - Bleeding is usually painless. You might see bright red blood after using the toilet. ?Pain - If a blood clot forms inside a hemorrhoid, this can cause pain. It can also cause a lump that you might be able to feel.   What can I do to keep from getting more hemorrhoids? -- The most important thing you can do is to keep from getting constipated. You should have a bowel movement at least a few times a week. When you have a bowel movement, you also should not have to push too much. Plus, your bowel movements should not be too hard. Being constipated and having hard bowel movements can make hemorrhoids worse.   I have prescribed Topical Hydrocortisone ointment 2.5%.  Apply to area two times per day for 30 days  HOME CARE: Sitz Baths twice daily. Soak buttocks in 2 or 3 inches of warm water for 10 to 15 minutes. Do not add soap, bubble bath, or anything to the water. Stool softener such as Colace 100 mg twice daily AND Miralax 1 scoop daily until you have regular soft stools Over the counter Preparation H Tucks Pads Witch Hazel  Here are some steps you can take to avoid getting constipated or having hard stools:  ?Eat lots of fruits, vegetables, and other foods with fiber. Fiber helps to increase bowel movements. If you do not get enough fiber from your diet, you can take fiber supplements. These come in the form of powders, wafers, or  pills. Some examples are Metamucil, Citrucel, Benefiber and FiberCon. If you take a fiber supplement, be sure to read the label so you know how much to take. If you're not sure, ask your provider or nurse. ?Take medicines called "stool softeners" such as docusate sodium (sample brand names: Colace, Dulcolax). These medicines increase the number of bowel movements you have. They are safe to take and they can prevent problems later.  You should request a referral for a follow up evaluation with a Gastroenterologist (GI doctor) to evaluate this chronic and relapsing condition - even if it improves to see what further steps need to be taken. This is highly linked to chronic constipation and straining to have a bowel movement. It may require further treatment or surgical intervention.   GET HELP RIGHT AWAY IF: You develop severe pain You have heavy bleeding   FOLLOW UP WITH YOUR PRIMARY PROVIDER IF: If your symptoms do not improve within 10 days  MAKE SURE YOU  Understand these instructions. Will watch your condition. Will get help right away if you are not doing well or get worse.   Thank you for choosing an e-visit.  Your e-visit answers were reviewed by a board certified advanced clinical practitioner to complete your personal care plan. Depending upon the condition, your plan could have included both over the counter or prescription medications.  Please review your pharmacy choice. Make   sure the pharmacy is open so you can pick up prescription now. If there is a problem, you may contact your provider through [employer] and have the prescription routed to another pharmacy.  Your safety is important to [place]. If you have drug allergies check your prescription carefully.   For the next 24 hours you can use MyChart to ask questions about today's visit, request a non-urgent call back, or ask for a work or school excuse. You will get an email in the next two days asking about your experience.  I hope that your e-visit has been valuable and will speed your recovery.  I have spent 5 minutes in review of e-visit questionnaire, review and updating patient chart, medical decision making and response to patient.   Mar Daring, PA-C

## 2024-04-11 ENCOUNTER — Encounter: Payer: Self-pay | Admitting: Family Medicine

## 2024-04-12 ENCOUNTER — Encounter: Payer: Self-pay | Admitting: General Surgery

## 2024-04-12 ENCOUNTER — Ambulatory Visit (INDEPENDENT_AMBULATORY_CARE_PROVIDER_SITE_OTHER): Admitting: Obstetrics and Gynecology

## 2024-04-12 ENCOUNTER — Ambulatory Visit: Admitting: General Surgery

## 2024-04-12 VITALS — BP 122/68 | HR 100 | Wt 187.0 lb

## 2024-04-12 VITALS — BP 107/75 | HR 82 | Temp 98.2°F | Resp 14 | Ht 66.0 in | Wt 186.0 lb

## 2024-04-12 DIAGNOSIS — N898 Other specified noninflammatory disorders of vagina: Secondary | ICD-10-CM

## 2024-04-12 DIAGNOSIS — K645 Perianal venous thrombosis: Secondary | ICD-10-CM | POA: Diagnosis not present

## 2024-04-12 DIAGNOSIS — Z09 Encounter for follow-up examination after completed treatment for conditions other than malignant neoplasm: Secondary | ICD-10-CM

## 2024-04-12 NOTE — Assessment & Plan Note (Signed)
 Exam today consistent with 1 grade 3 thrombosed hemorrhoid, 1 large grade 2 hemorrhoid.  Clinically unclear benefit from potential to lance small thrombosed hemorrhoid given size and apparent in process self resolution.  Discussed with patient, who would prefer conservative management below.   - Continue steroid cream and Preparation H. - Provided with [facility] sample - Counseled to reach out if not improving with conservative management to reevaluate - Sitz bath's every 4-6 hours - Briefly discussed pathophysiology of hemorrhoids and hemorrhoidectomy

## 2024-04-12 NOTE — Progress Notes (Signed)
 Patient presents for 10 week postop from [facility], bilateral salpingectomy, cystoscopy. She is doing well. No fevers, VB, dysuria or severe abdominal pain.  BP 122/68 (BP Location: Left Arm, Patient Position: Sitting, Cuff Size: Normal)   Pulse 100   Wt 187 lb (84.8 kg)   LMP 01/01/2024 (Exact Date)   SpO2 99%   BMI 30.18 kg/m   SVE: sutures seen and dissolving, possible BV discharge, no bleeding Tenderness on Left lateral vaginal cuff with qtip palpation A/p PO from [facility] 10 weeks doing well Resume activities 2. Pelvic rest for additional 2 wks 3. Encouraged annual mammograms and resume annual care  4. Counseled that nuswab and urea testing was done and possible need for treatment  Dr. Glennon

## 2024-04-12 NOTE — Progress Notes (Signed)
 I was present with the medical student for this service. I personally verified the history of present illness, performed the physical exam, and made the plan for this encounter. I have verified the medical student's documentation and made modifications where appropriately. I have personally documented in my own words a brief history, physical, and plan below.     Patient with area of thrombosed hemorrhoid right anterior that looks like the hemorrhoid likely has partial necrosis and will slough off soon.   She is going to continue conservative measures and notify us  if this continues to be an issue.  Tanya Pander, MD [facility] [address] [place], [place] [address] [phone] (office)      SUBJECTIVE:   CHIEF COMPLAINT / HPI: hemorrhoids  Discussed the use of AI scribe software for clinical note transcription with the patient, who gave verbal consent to proceed.  History of Present Illness Tanya Sanders is a 38 year old female who presents with severe hemorrhoid pain.  Hemorrhoidal pain - Severe pain began on Sunday and progressively worsened, disrupting sleep. - Pain intensifies at night and during bowel movements. - No constipation. - Slight daytime relief with steroid cream and Preparation H. - Occasional bright pink blood with wiping, no significant bleeding. - No sitz baths attempted; uses warm water  in the shower twice daily for symptom relief.  Relevant medical history - History of hemorrhoids during pregnancy, resolved without intervention. - Hysterectomy performed at the end of September without complications.  Lifestyle factors - Sits frequently for work but remains active by moving around regularly.    PERTINENT  PMH / PSH: PCOS, HLD  OBJECTIVE:   BP 107/75   Pulse 82   Temp 98.2 F (36.8 C) (Oral)   Resp 14   Ht 5' 6 (1.676 m)   Wt 186 lb (84.4 kg)   LMP 01/01/2024 (Exact Date)   SpO2 97%   BMI 30.02  kg/m   Physical Exam General: NAD, well appearing Neuro: A&O Cardiovascular: RRR, no murmurs,  Respiratory: normal WOB on RA, CTAB, no wheezes, ronchi or rales Abdomen/GI: No anal fissures, right posterior large hemorrhoid, pale tissue color, right anterior small dry/purple hemorrhoid Extremities: Moving all 4 extremities equally, no peripheral edema    ASSESSMENT/PLAN:   Assessment & Plan Internal thrombosed hemorrhoids Exam today consistent with 1 grade 3 thrombosed hemorrhoid, 1 large grade 2 hemorrhoid.  Clinically unclear benefit from potential to lance small thrombosed hemorrhoid given size and apparent in process self resolution.  Discussed with patient, who would prefer conservative management below.   - Continue steroid cream and Preparation H. - Provided with [facility]iCare sample - Counseled to reach out if not improving with conservative management to reevaluate - Sitz bath's every 4-6 hours - Briefly discussed pathophysiology of hemorrhoids and hemorrhoidectomy  Ozell Provencal, MD, PGY-3 [facility] 12:04 PM 04/12/2024  [facility]

## 2024-04-12 NOTE — Patient Instructions (Signed)
 Do sitz baths every 4-6 hours. Can continue your ointments. Call with changes.

## 2024-04-13 LAB — SURESWAB® ADVANCED VAGINITIS PLUS,TMA
C. trachomatis RNA, TMA: NOT DETECTED
CANDIDA SPECIES: NOT DETECTED
Candida glabrata: NOT DETECTED
N. gonorrhoeae RNA, TMA: NOT DETECTED
SURESWAB(R) ADV BACTERIAL VAGINOSIS(BV),TMA: NEGATIVE
TRICHOMONAS VAGINALIS (TV),TMA: NOT DETECTED

## 2024-04-13 NOTE — Progress Notes (Signed)
 I was present with the medical student for this service. I personally verified the history of present illness, performed the physical exam, and made the plan for this encounter. I have verified the medical student's documentation and made modifications where appropriately. I have personally documented in my own words a brief history, physical, and plan below.     Patient with area of thrombosed hemorrhoid right anterior that looks like the hemorrhoid likely has partial necrosis and will slough off soon.   She is going to continue conservative measures and notify us  if this continues to be an issue.  Tanya Pander, MD [facility] [address] [place] [address], [place] [address] [phone] (office)      SUBJECTIVE:   CHIEF COMPLAINT / HPI: hemorrhoids  Discussed the use of AI scribe software for clinical note transcription with the patient, who gave verbal consent to proceed.  History of Present Illness Tanya Sanders is a 38 year old female who presents with severe hemorrhoid pain.  Hemorrhoidal pain - Severe pain began on Sunday and progressively worsened, disrupting sleep. - Pain intensifies at night and during bowel movements. - No constipation. - Slight daytime relief with steroid cream and Preparation H. - Occasional bright pink blood with wiping, no significant bleeding. - No sitz baths attempted; uses warm water  in the shower twice daily for symptom relief.  Relevant medical history - History of hemorrhoids during pregnancy, resolved without intervention. - Hysterectomy performed at the end of September without complications.  Lifestyle factors - Sits frequently for work but remains active by moving around regularly.    PERTINENT  PMH / PSH: PCOS, HLD  OBJECTIVE:   BP 107/75   Pulse 82   Temp 98.2 F (36.8 C) (Oral)   Resp 14   Ht 5' 6 (1.676 m)   Wt 186 lb (84.4 kg)   LMP 01/01/2024 (Exact Date)   SpO2 97%   BMI 30.02  kg/m   Physical Exam General: NAD, well appearing Neuro: A&O Cardiovascular: RRR, no murmurs,  Respiratory: normal WOB on RA, CTAB, no wheezes, ronchi or rales Abdomen/GI: No anal fissures, right posterior large hemorrhoid, pale tissue color, right anterior small dry/purple hemorrhoid Extremities: Moving all 4 extremities equally, no peripheral edema    ASSESSMENT/PLAN:   Assessment & Plan Internal thrombosed hemorrhoids Exam today consistent with 1 grade 3 thrombosed hemorrhoid, 1 large grade 2 hemorrhoid.  Clinically unclear benefit from potential to lance small thrombosed hemorrhoid given size and apparent in process self resolution.  Discussed with patient, who would prefer conservative management below.   - Continue steroid cream and Preparation H. - Provided with RectiCare sample - Counseled to reach out if not improving with conservative management to reevaluate - Sitz bath's every 4-6 hours - Briefly discussed pathophysiology of hemorrhoids and hemorrhoidectomy  Ozell Provencal, MD, PGY-3 [facility] 12:04 PM 04/12/2024  [facility]

## 2024-04-15 ENCOUNTER — Ambulatory Visit: Payer: Self-pay | Admitting: Obstetrics and Gynecology

## 2024-04-15 LAB — SURESWAB MYCOPLASMA/UREAPLASMA, PCR
M. hominis DNA: NOT DETECTED
MYCOPLASMA GENITALIUM, rRNA,TMA: NOT DETECTED
U. parvum DNA: NOT DETECTED
U. urealyticum DNA: NOT DETECTED

## 2024-04-26 ENCOUNTER — Encounter: Payer: Self-pay | Admitting: Obstetrics and Gynecology

## 2024-05-14 ENCOUNTER — Telehealth: Admitting: Physician Assistant

## 2024-05-14 DIAGNOSIS — R3989 Other symptoms and signs involving the genitourinary system: Secondary | ICD-10-CM

## 2024-05-14 MED ORDER — NITROFURANTOIN MONOHYD MACRO 100 MG PO CAPS: 100.0000 mg | ORAL_CAPSULE | Freq: Two times a day (BID) | ORAL | 0 refills | Status: AC

## 2024-05-14 NOTE — Progress Notes (Signed)

## 2024-05-15 ENCOUNTER — Ambulatory Visit: Admitting: General Surgery

## 2024-05-15 DIAGNOSIS — R635 Abnormal weight gain: Secondary | ICD-10-CM | POA: Diagnosis not present

## 2024-05-15 DIAGNOSIS — Z6830 Body mass index (BMI) 30.0-30.9, adult: Secondary | ICD-10-CM | POA: Diagnosis not present

## 2024-05-15 DIAGNOSIS — N951 Menopausal and female climacteric states: Secondary | ICD-10-CM | POA: Diagnosis not present

## 2024-05-15 DIAGNOSIS — E7439 Other disorders of intestinal carbohydrate absorption: Secondary | ICD-10-CM | POA: Diagnosis not present

## 2024-05-15 DIAGNOSIS — E785 Hyperlipidemia, unspecified: Secondary | ICD-10-CM | POA: Diagnosis not present

## 2024-06-07 ENCOUNTER — Encounter (HOSPITAL_COMMUNITY): Payer: Self-pay

## 2024-06-07 ENCOUNTER — Other Ambulatory Visit: Payer: Self-pay

## 2024-06-07 ENCOUNTER — Telehealth: Admitting: Family Medicine

## 2024-06-07 ENCOUNTER — Other Ambulatory Visit (HOSPITAL_COMMUNITY): Payer: Self-pay

## 2024-06-07 DIAGNOSIS — R6889 Other general symptoms and signs: Secondary | ICD-10-CM | POA: Diagnosis not present

## 2024-06-07 DIAGNOSIS — R051 Acute cough: Secondary | ICD-10-CM

## 2024-06-07 MED ORDER — OSELTAMIVIR PHOSPHATE 75 MG PO CAPS
75.0000 mg | ORAL_CAPSULE | Freq: Two times a day (BID) | ORAL | 0 refills | Status: AC
  Filled 2024-06-07: qty 10, 5d supply, fill #0

## 2024-06-07 MED ORDER — BENZONATATE 100 MG PO CAPS
100.0000 mg | ORAL_CAPSULE | Freq: Three times a day (TID) | ORAL | 0 refills | Status: AC | PRN
  Filled 2024-06-07: qty 30, 5d supply, fill #0

## 2024-06-07 NOTE — Progress Notes (Signed)
 E visit for Flu like symptoms   We are sorry that you are not feeling well.  Here is how we plan to help! Based on what you have shared with me it looks like you may have a respiratory virus that may be influenza.  Influenza or "the flu" is  an infection caused by a respiratory virus. The flu virus is highly contagious and persons who did not receive their yearly flu vaccination may "catch" the flu from close contact.  We have anti-viral medications to treat the viruses that cause this infection. They are not a "cure" and only shorten the course of the infection. These prescriptions are most effective when they are given within the first 2 days of "flu" symptoms. Antiviral medications are indicated if you have a high risk of complications from the flu. You should  also consider an antiviral medication if you are in close contact with someone who is at risk. These medications can help patients avoid complications from the flu but have side effects that you should know.   Possible side effects from Tamiflu  or oseltamivir  include nausea, vomiting, diarrhea, dizziness, headaches, eye redness, sleep problems or other respiratory symptoms. You should not take Tamiflu  if you have an allergy to oseltamivir  or any to the ingredients in Tamiflu .  Based upon your symptoms and potential risk factors I have prescribed Oseltamivir  (Tamiflu ).  It has been sent to your designated pharmacy.  You will take one 75 mg capsule orally twice a day for the next 5 days.   For nasal congestion, you may use an oral decongestant such as Mucinex D or if you have glaucoma or high blood pressure use plain Mucinex.  Saline nasal spray or nasal drops can help and can safely be used as often as needed for congestion.  If you have a sore or scratchy throat, use a saltwater gargle-  to  teaspoon of salt dissolved in a 4-ounce to 8-ounce glass of warm water.  Gargle the solution for approximately 15-30 seconds and then spit.  It is  important not to swallow the solution.  You can also use throat lozenges/cough drops and Chloraseptic spray to help with throat pain or discomfort.  Warm or cold liquids can also be helpful in relieving throat pain.  For headache, pain or general discomfort, you can use Ibuprofen or Tylenol as directed.   Some authorities believe that zinc sprays or the use of Echinacea may shorten the course of your symptoms.  I have prescribed the following medications to help lessen symptoms: I have prescribed Tessalon  Perles 100 mg. You may take 1-2 capsules every 8 hours as needed for cough  You are to isolate at home until you have been fever-free for at least 24 hours without a fever-reducing medication, and symptoms have been steadily improving for 24 hours.  If you must be around other household members who do not have symptoms, you need to make sure that both you and the family members are masking consistently with a high-quality mask.  If you note any worsening of symptoms despite treatment, please seek an in-person evaluation ASAP. If you note any significant shortness of breath or any chest pain, please seek ED evaluation. Please do not delay care!  ANYONE WHO HAS FLU SYMPTOMS SHOULD: Stay home. The flu is highly contagious and going out or to work exposes others! Be sure to drink plenty of fluids. Water is fine as well as fruit juices, sodas and electrolyte beverages. You may want to stay  away from caffeine or alcohol. If you are nauseated, try taking small sips of liquids. How do you know if you are getting enough fluid? Your urine should be a pale yellow or almost colorless. Get rest. Taking a steamy shower or using a humidifier may help nasal congestion and ease sore throat pain. Using a saline nasal spray works much the same way. Cough drops, hard candies and sore throat lozenges may ease your cough. Line up a caregiver. Have someone check on you regularly.  GET HELP RIGHT AWAY IF: You cannot  keep down liquids or your medications. You become short of breath Your fell like you are going to pass out or loose consciousness. Your symptoms persist after you have completed your treatment plan  MAKE SURE YOU  Understand these instructions. Will watch your condition. Will get help right away if you are not doing well or get worse.  Your e-visit answers were reviewed by a board certified advanced clinical practitioner to complete your personal care plan.  Depending on the condition, your plan could have included both over the counter or prescription medications.  If there is a problem please reply  once you have received a response from your provider.  Your safety is important to us .  If you have drug allergies check your prescription carefully.    You can use MyChart to ask questions about today's visit, request a non-urgent call back, or ask for a work or school excuse for 24 hours related to this e-Visit. If it has been greater than 24 hours you will need to follow up with your provider, or enter a new e-Visit to address those concerns.  You will get an e-mail in the next two days asking about your experience.  I hope that your e-visit has been valuable and will speed your recovery. Thank you for using e-visits.   I have spent 5 minutes in review of e-visit questionnaire, review and updating patient chart, medical decision making and response to patient.   Tanya CHRISTELLA Barefoot, NP
